# Patient Record
Sex: Female | Born: 1995 | Race: White | Hispanic: No | Marital: Single | Smoking: Never smoker
Health system: Southern US, Community
[De-identification: ages and names within clinical notes are randomized; demographics above are authoritative.]

## PROBLEM LIST (undated history)

## (undated) DIAGNOSIS — F32A Depression, unspecified: Secondary | ICD-10-CM

## (undated) DIAGNOSIS — Z87442 Personal history of urinary calculi: Secondary | ICD-10-CM

## (undated) DIAGNOSIS — D649 Anemia, unspecified: Secondary | ICD-10-CM

## (undated) DIAGNOSIS — N764 Abscess of vulva: Secondary | ICD-10-CM

## (undated) DIAGNOSIS — N200 Calculus of kidney: Secondary | ICD-10-CM

## (undated) DIAGNOSIS — Z5189 Encounter for other specified aftercare: Secondary | ICD-10-CM

## (undated) HISTORY — PX: WISDOM TOOTH EXTRACTION: SHX21

## (undated) HISTORY — DX: Depression, unspecified: F32.A

## (undated) HISTORY — DX: Calculus of kidney: N20.0

## (undated) HISTORY — PX: TUBAL LIGATION: SHX77

## (undated) HISTORY — DX: Encounter for other specified aftercare: Z51.89

## (undated) HISTORY — DX: Abscess of vulva: N76.4

---
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
## 2017-03-20 ENCOUNTER — Ambulatory Visit (INDEPENDENT_AMBULATORY_CARE_PROVIDER_SITE_OTHER): Payer: Self-pay | Admitting: Family Medicine

## 2017-03-20 ENCOUNTER — Encounter: Payer: Self-pay | Admitting: Family Medicine

## 2017-03-20 VITALS — BP 102/66 | HR 80 | Temp 98.6°F | Resp 18 | Wt 102.4 lb

## 2017-03-20 DIAGNOSIS — N926 Irregular menstruation, unspecified: Secondary | ICD-10-CM

## 2017-03-20 DIAGNOSIS — Z3009 Encounter for other general counseling and advice on contraception: Secondary | ICD-10-CM

## 2017-03-20 DIAGNOSIS — Z3201 Encounter for pregnancy test, result positive: Secondary | ICD-10-CM

## 2017-03-20 LAB — POCT URINE PREGNANCY: Preg Test, Ur: POSITIVE — AB

## 2017-03-20 MED ORDER — PRENATAL VITAMINS 28-0.8 MG PO TABS: 1.0000 | ORAL_TABLET | Freq: Every day | ORAL | 11 refills | Status: DC

## 2017-03-20 NOTE — Progress Notes (Signed)
BP 102/66   Pulse 80   Temp 98.6 F (37 C) (Oral)   Resp 18   Wt 102 lb 6.4 oz (46.4 kg)   LMP 03/16/2017   SpO2 98%   BMI 20.68 kg/m    Subjective:    Patient ID: Tammy Ward, female    DOB: July 18, 1996, 21 y.o.   MRN: [id number]  HPI: Tammy Ward is a 21 y.o. female  Chief Complaint  Patient presents with  . positive pregnancy test    at home   Patient is here to establish confirmation of pregnancy LMP 02/16/17; EDD 11/23/17 Few PNV, needs some Headaches No alcohol No smoking Tried nuvaring but it caused a ton of pain Extreme cramps Not regular with taking pills Really bad cramps in periods Had a bartholin cyst in the past  Depression screen [facility] 2/9 03/20/2017 12/19/2015  Decreased Interest 0 0  Down, Depressed, Hopeless 0 0  PHQ - 2 Score 0 0   Relevant past medical, surgical, family and social history reviewed Past Medical History:  Diagnosis Date  . Kidney stones   . Left genital labial abscess    Past Surgical History:  Procedure Laterality Date  . ABSCESS DRAINAGE  02-02-15   Labial   . KIDNEY STONE SURGERY  2014   Family History  Problem Relation Age of Onset  . Asthma Sister   . Cancer Neg Hx   . Diabetes Neg Hx   . Heart disease Neg Hx    Social History  Substance Use Topics  . Smoking status: Never Smoker  . Smokeless tobacco: Never Used  . Alcohol use No   Interim medical history since last visit reviewed. Allergies and medications reviewed  Review of Systems Per HPI unless specifically indicated above     Objective:    BP 102/66   Pulse 80   Temp 98.6 F (37 C) (Oral)   Resp 18   Wt 102 lb 6.4 oz (46.4 kg)   LMP 03/16/2017   SpO2 98%   BMI 20.68 kg/m   Wt Readings from Last 3 Encounters:  03/20/17 102 lb 6.4 oz (46.4 kg) (6 %, Z= -1.57)*  04/29/16 101 lb 1.6 oz (45.9 kg) (6 %, Z= -1.58)*  04/16/16 102 lb 2 oz (46.3 kg) (7 %, Z= -1.49)*   * Growth percentiles are based on CDC 2-20 Years data.      Physical Exam  Constitutional: She appears well-developed and well-nourished.  HENT:  Mouth/Throat: Mucous membranes are normal.  Eyes: EOM are normal. No scleral icterus.  Cardiovascular: Normal rate and regular rhythm.   Pulmonary/Chest: Effort normal and breath sounds normal.  Psychiatric: She has a normal mood and affect. Her behavior is normal.    Results for orders placed or performed in visit on 03/20/17  POCT urine pregnancy  Result Value Ref Range   Preg Test, Ur Positive (A) Negative      Assessment & Plan:   Problem List Items Addressed This Visit      Other   Positive pregnancy test - Primary    Refer to OB; discussed no alcohol safe during pregnancy; avoid medicine if possible, verify safety first with OB even for OTCs; prenatal vitamins prescribed      Relevant Orders   Ambulatory referral to Obstetrics / Gynecology   Encounter for contraceptive planning    Encouraged patient to talk with her OB to plan for contraception after current pregnancy       Other Visit  Diagnoses    Missed period       urine pregnancy checked here today; positive   Relevant Orders   POCT urine pregnancy (Completed)   Ambulatory referral to Obstetrics / Gynecology       Follow up plan: No Follow-up on file.  An after-visit summary was printed and given to the patient at check-out.  Please see the patient instructions which may contain other information and recommendations beyond what is mentioned above in the assessment and plan.  Meds ordered this encounter  Medications  . Multiple Vitamin (MULTI-VITAMINS PO)    Sig: Take by mouth.  . Prenatal Vit-Fe Fumarate-FA (PRENATAL VITAMINS) 28-0.8 MG TABS    Sig: Take 1 tablet by mouth daily.    Dispense:  30 tablet    Refill:  11    Orders Placed This Encounter  Procedures  . Ambulatory referral to Obstetrics / Gynecology  . POCT urine pregnancy

## 2017-03-20 NOTE — Patient Instructions (Addendum)
Try to drink 64 ounces of water a day Take a prenatal vitamin daily We'll refer you to Dr. Greggory Keen  Prenatal Care WHAT IS PRENATAL CARE? Prenatal care is the process of caring for a pregnant woman before she gives birth. Prenatal care makes sure that she and her baby remain as healthy as possible throughout pregnancy. Prenatal care may be provided by a midwife, family practice health care provider, or a childbirth and pregnancy specialist (obstetrician). Prenatal care may include physical examinations, testing, treatments, and education on nutrition, lifestyle, and social support services. WHY IS PRENATAL CARE SO IMPORTANT? Early and consistent prenatal care increases the chance that you and your baby will remain healthy throughout your pregnancy. This type of care also decreases a baby's risk of being born too early (prematurely), or being born smaller than expected (small for gestational age). Any underlying medical conditions you may have that could pose a risk during your pregnancy are discussed during prenatal care visits. You will also be monitored regularly for any new conditions that may arise during your pregnancy so they can be treated quickly and effectively. WHAT HAPPENS DURING PRENATAL CARE VISITS? Prenatal care visits may include the following: Discussion Tell your health care provider about any new signs or symptoms you have experienced since your last visit. These might include:  Nausea or vomiting.  Increased or decreased level of energy.  Difficulty sleeping.  Back or leg pain.  Weight changes.  Frequent urination.  Shortness of breath with physical activity.  Changes in your skin, such as the development of a rash or itchiness.  Vaginal discharge or bleeding.  Feelings of excitement or nervousness.  Changes in your baby's movements. You may want to write down any questions or topics you want to discuss with your health care provider and bring them with you to  your appointment. Examination During your first prenatal care visit, you will likely have a complete physical exam. Your health care provider will often examine your vagina, cervix, and the position of your uterus, as well as check your heart, lungs, and other body systems. As your pregnancy progresses, your health care provider will measure the size of your uterus and your baby's position inside your uterus. He or she may also examine you for early signs of labor. Your prenatal visits may also include checking your blood pressure and, after about 10-12 weeks of pregnancy, listening to your baby's heartbeat. Testing Regular testing often includes:  Urinalysis. This checks your urine for glucose, protein, or signs of infection.  Blood count. This checks the levels of white and red blood cells in your body.  Tests for sexually transmitted infections (STIs). Testing for STIs at the beginning of pregnancy is routinely done and is required in many states.  Antibody testing. You will be checked to see if you are immune to certain illnesses, such as rubella, that can affect a developing fetus.  Glucose screen. Around 24-28 weeks of pregnancy, your blood glucose level will be checked for signs of gestational diabetes. Follow-up tests may be recommended.  Group B strep. This is a bacteria that is commonly found inside a woman's vagina. This test will inform your health care provider if you need an antibiotic to reduce the amount of this bacteria in your body prior to labor and childbirth.  Ultrasound. Many pregnant women undergo an ultrasound screening around 18-20 weeks of pregnancy to evaluate the health of the fetus and check for any developmental abnormalities.  HIV (human immunodeficiency virus) testing. Early  in your pregnancy, you will be screened for HIV. If you are at high risk for HIV, this test may be repeated during your third trimester of pregnancy. You may be offered other testing based on  your age, personal or family medical history, or other factors. HOW OFTEN SHOULD I PLAN TO SEE MY HEALTH CARE PROVIDER FOR PRENATAL CARE? Your prenatal care check-up schedule depends on any medical conditions you have before, or develop during, your pregnancy. If you do not have any underlying medical conditions, you will likely be seen for checkups:  Monthly, during the first 6 months of pregnancy.  Twice a month during months 7 and 8 of pregnancy.  Weekly starting in the 9th month of pregnancy and until delivery. If you develop signs of early labor or other concerning signs or symptoms, you may need to see your health care provider more often. Ask your health care provider what prenatal care schedule is best for you. WHAT CAN I DO TO KEEP MYSELF AND MY BABY AS HEALTHY AS POSSIBLE DURING MY PREGNANCY?  Take a prenatal vitamin containing 400 micrograms (0.4 mg) of folic acid every day. Your health care provider may also ask you to take additional vitamins such as iodine, vitamin D, iron, copper, and zinc.  Take 1500-2000 mg of calcium daily starting at your 20th week of pregnancy until you deliver your baby.  Make sure you are up to date on your vaccinations. Unless directed otherwise by your health care provider:  You should receive a tetanus, diphtheria, and pertussis (Tdap) vaccination between the 27th and 36th week of your pregnancy, regardless of when your last Tdap immunization occurred. This helps protect your baby from whooping cough (pertussis) after he or she is born.  You should receive an annual inactivated influenza vaccine (IIV) to help protect you and your baby from influenza. This can be done at any point during your pregnancy.  Eat a well-rounded diet that includes:  Fresh fruits and vegetables.  Lean proteins.  Calcium-rich foods such as milk, yogurt, hard cheeses, and dark, leafy greens.  Whole grain breads.  Do noteat seafood high in mercury,  including:  Swordfish.  Tilefish.  Shark.  King mackerel.  More than 6 oz tuna per week.  Do not eat:  Raw or undercooked meats or eggs.  Unpasteurized foods, such as soft cheeses (brie, blue, or feta), juices, and milks.  Lunch meats.  Hot dogs that have not been heated until they are steaming.  Drink enough water to keep your urine clear or pale yellow. For many women, this may be 10 or more 8 oz glasses of water each day. Keeping yourself hydrated helps deliver nutrients to your baby and may prevent the start of pre-term uterine contractions.  Do not use any tobacco products including cigarettes, chewing tobacco, or electronic cigarettes. If you need help quitting, ask your health care provider.  Do not drink beverages containing alcohol. No safe level of alcohol consumption during pregnancy has been determined.  Do not use any illegal drugs. These can harm your developing baby or cause a miscarriage.  Ask your health care provider or pharmacist before taking any prescription or over-the-counter medicines, herbs, or supplements.  Limit your caffeine intake to no more than 200 mg per day.  Exercise. Unless told otherwise by your health care provider, try to get 30 minutes of moderate exercise most days of the week. Do not  do high-impact activities, contact sports, or activities with a high risk of falling,  such as horseback riding or downhill skiing.  Get plenty of rest.  Avoid anything that raises your body temperature, such as hot tubs and saunas.  If you own a cat, do not empty its litter box. Bacteria contained in cat feces can cause an infection called toxoplasmosis. This can result in serious harm to the fetus.  Stay away from chemicals such as insecticides, lead, mercury, and cleaning or paint products that contain solvents.  Do not have any X-rays taken unless medically necessary.  Take a childbirth and breastfeeding preparation class. Ask your health care  provider if you need a referral or recommendation. This information is not intended to replace advice given to you by your health care provider. Make sure you discuss any questions you have with your health care provider. Document Released: 12/11/2003 Document Revised: 05/12/2016 Document Reviewed: 02/22/2014 Elsevier Interactive Patient Education  2017 [employer].  Pregnancy, The Father's Role A father has an important role during his partner's pregnancy, labor, delivery, and after the birth of the baby. It is important to help and support your partner through this new period. There are many physical and emotional changes that happen. To be helpful and supportive during this time, you should know and understand what is happening to your partner during pregnancy, labor, delivery, and after the baby is born. What are the stages of pregnancy? Pregnancy usually lasts about 40 weeks. The pregnancy is divided into three trimesters. First Trimester  During the first 13 weeks, your partner may:  Feel tired.  Have painful breasts.  Feel nauseous or throw up.  Urinate more often.  Have mood changes. All of these changes are normal. If they are happening, try to be helpful, supportive, and understanding. This may include helping with household duties and activities and spending more time with each other. Second Trimester  During the next 14-28 weeks:  Your partner will likely feel better and more energetic.  This is the best time of the pregnancy to be more active together.  You will be able to see her belly showing the pregnancy.  You may be able to feel the baby kick.  Your partner may have soreness or aching in her back as she gains weight. You can help her by carrying heavy things and by rubbing her back when she is feeling sore. Third Trimester  During the final 12 weeks, your partner may:  Become more uncomfortable as the baby grows.  Have a hard time doing everyday activities,  and her balance may be off.  Have a hard time bending over.  Tire easily.  Have difficulty sleeping. At this time, the birth of your baby is close. You and your partner may have concerns or questions. This is normal. Talk with each other and with your health care provider. Continue to help your partner with housework, encourage her to rest, and rub her sore back and legs, if this helps her. What can I expect or do during the pregnancy? You can expect to experience some changes. There are also many things you can do to help prepare you and your partner for your baby. Emotional Changes  During your partner's pregnancy, emotional changes for you may include:  Having feelings of happiness, excitement, and pride.  Being concerned about having new responsibilities, such as financial or educational responsibilities.  Feeling overwhelmed or scared.  Being worried that a baby will change your relationship with your partner. These feelings are normal. Talk about them openly with your partner and your  health care provider. Prenatal Care  Attend prenatal care visits with your partner. This is a good time for you to get to know your health care provider, follow the pregnancy, and ask questions.  Prenatal visits usually occur one time each month for six months, then every two weeks for two months, and then one time each week during the last month. You may have more prenatal visits if your health care provider believes this is needed.  Your health care provider usually does an ultrasound of the baby at one of the prenatal visits. This may happen more often if your health care provider thinks it is needed. Sexual Activity  Sexual intercourse is safe unless there is a problem with the pregnancy and your health care provider advises you to not have sexual intercourse. Because physical and emotional changes happen in pregnancy, your partner may not want to have sex during certain times. Trying different  positions may make sexual intercourse more comfortable. However, always respect your partner's decision if she does not want to have sex. It is important for both of you to discuss your feelings and desires. Talk with your health care provider about any questions that you may have about sexual intercourse during pregnancy. Childbirth Classes  Attend childbirth classes with your partner if you are able. Classes prepare you and help you to understand what happens during labor and delivery, and they help you and your partner to bond. There are even some classes that are only for new fathers. Classes also teach you and your partner:  Various relaxation techniques.  How to work with her labor pains.  How to focus during labor and delivery. What should I know about labor and delivery? Many fathers want to be present while their partner is going through labor and delivery. You may:  Be asked to time the contractions, massage your partner's back, and breathe with her during the contractions.  Get to see and enjoy the excitement of your baby being born, and you may even be able to cut your baby's umbilical cord. If you feel like you might faint or you are uncomfortable, ask someone to help you.  Need to leave the room if a problem develops during labor or delivery. A cesarean delivery, or C-section, is a procedure that may be used to deliver the baby. It is done through an incision in the abdomen and the uterus. A cesarean delivery may be scheduled or it may be an emergency procedure during labor and delivery. Most hospitals allow the father to be in the room for a cesarean delivery unless it is an emergency. Recovery from a cesarean delivery usually requires more help from the father. What happens after delivery? After your baby is born, your partner will go through many changes again. These changes could last a few months or longer. Postpartum Depression  Your partner may take awhile to regain her  strength. She may also have feelings of sadness (postpartum blues or postpartum depression). If your partner is acting unusually sad or depressed, talk with your health care provider right away. This can be a serious medical condition that requires treatment. Breastfeeding  Your partner may decide to breastfeed the baby. This helps with bonding between the mother and the baby, and breast milk is the best nutrition for your baby. You can feel included by burping the baby and bottle-feeding the baby with breast milk that was collected from the mother. This allows your partner to rest and helps you to bond with  your baby. Sexual Activity  It may take a few months for your partner's body to heal and be ready for sexual intercourse again. This may take longer after a cesarean delivery. If you have any questions about having sexual intercourse or if it is painful for your partner, talk with your health care provider. It is possible for breastfeeding mothers to become pregnant even if they are not having menstrual periods. Use birth control (contraception) unless you and your partner would like to become pregnant again. What should I remember? Fatherhood and having a baby is an ongoing learning experience. It is common to be anxious, concerned, or afraid that you may not be taking care of your newborn baby properly. It is important to talk with your partner and your health care provider if you are worried or have any questions. This information is not intended to replace advice given to you by your health care provider. Make sure you discuss any questions you have with your health care provider. Document Released: 05/26/2008 Document Revised: 05/12/2016 Document Reviewed: 08/25/2014 Elsevier Interactive Patient Education  2017 [employer].

## 2017-04-05 NOTE — Assessment & Plan Note (Signed)
Encouraged patient to talk with her OB to plan for contraception after current pregnancy

## 2017-04-05 NOTE — Assessment & Plan Note (Signed)
Refer to OB; discussed no alcohol safe during pregnancy; avoid medicine if possible, verify safety first with OB even for OTCs; prenatal vitamins prescribed

## 2017-04-13 ENCOUNTER — Ambulatory Visit (INDEPENDENT_AMBULATORY_CARE_PROVIDER_SITE_OTHER): Payer: Self-pay | Admitting: Certified Nurse Midwife

## 2017-04-13 VITALS — BP 95/61 | HR 74 | Ht 59.0 in | Wt 101.4 lb

## 2017-04-13 DIAGNOSIS — Z3401 Encounter for supervision of normal first pregnancy, first trimester: Secondary | ICD-10-CM

## 2017-04-13 DIAGNOSIS — Z113 Encounter for screening for infections with a predominantly sexual mode of transmission: Secondary | ICD-10-CM

## 2017-04-13 DIAGNOSIS — Z1389 Encounter for screening for other disorder: Secondary | ICD-10-CM

## 2017-04-13 NOTE — Progress Notes (Signed)
Ashby Dawes Chiappetta presents for NOB nurse interview visit. Pregnancy confirmation done on 03/20/17 by Dr. [name]. Sherie Don. UPT-positive. LMP: 02/16/2017 (exact).  G-1.  P-0000.  Pregnancy education material explained and given. No cats in the home. NOB labs ordered.  HIV labs and Drug screen were explained optional and she did not decline. Drug screen ordered. PNV encouraged. Genetic screening options discussed. Genetic testing: Unsure. May do the [facility].  Pt may discuss with provider. Pt. To follow up with provider in 4 weeks for NOB physical.  Pt having nausea. May try Vitamin B6 , 3xd and Unisom at night for sleep if needed. Sample of Bonjesta given but not coming across to order yet. FOB states there is a genetic trait on his side of family: Two different color eyes and difficulty hearing. All questions answered.

## 2017-04-13 NOTE — Patient Instructions (Signed)
Pregnancy and Zika Virus Disease Zika virus disease, or Zika, is an illness that can spread to people from mosquitoes that carry the virus. It may also spread from person to person through infected body fluids. Zika first occurred in [place], but recently it has spread to new areas. The virus occurs in tropical climates. The location of Zika continues to change. Most people who become infected with Zika virus do not develop serious illness. However, Zika may cause birth defects in an unborn baby whose mother is infected with the virus. It may also increase the risk of miscarriage. What are the symptoms of Zika virus disease? In many cases, people who have been infected with Zika virus do not develop any symptoms. If symptoms appear, they usually start about a week after the person is infected. Symptoms are usually mild. They may include:  Fever.  Rash.  Red eyes.  Joint pain. How does Zika virus disease spread? The main way that Zika virus spreads is through the bite of a certain type of mosquito. Unlike most types of mosquitos, which bite only at night, the type of mosquito that carries Zika virus bites both at night and during the day. Zika virus can also spread through sexual contact, through a blood transfusion, and from a mother to her baby before or during birth. Once you have had Zika virus disease, it is unlikely that you will get it again. Can I pass Zika to my baby during pregnancy? Yes, Zika can pass from a mother to her baby before or during birth. What problems can Zika cause for my baby? A woman who is infected with Zika virus while pregnant is at risk of having her baby born with a condition in which the brain or head is smaller than expected (microcephaly). Babies who have microcephaly can have developmental delays, seizures, hearing problems, and vision problems. Having Zika virus disease during pregnancy can also increase the risk of miscarriage. How can Zika virus disease be  prevented? There is no vaccine to prevent Zika. The best way to prevent the disease is to avoid infected mosquitoes and avoid exposure to body fluids that can spread the virus. Avoid any possible exposure to Zika by taking the following precautions. For women and their sex partners:  Avoid traveling to high-risk areas. The locations where Zika is being reported change often. To identify high-risk areas, check the CDC travel website: www.cdc.gov/zika/geo/index.html  If you or your sex partner must travel to a high-risk area, talk with a health care provider before and after traveling.  Take all precautions to avoid mosquito bites if you live in, or travel to, any of the high-risk areas. Insect repellents are safe to use during pregnancy.  Ask your health care provider when it is safe to have sexual contact. For women:  If you are pregnant or trying to become pregnant, avoid sexual contact with persons who may have been exposed to Zika virus, persons who have possible symptoms of Zika, or persons whose history you are unsure about. If you choose to have sexual contact with someone who may have been exposed to Zika virus, use condoms correctly during the entire duration of sexual activity, every time. Do not share sexual devices, as you may be exposed to body fluids.  Ask your health care provider about when it is safe to attempt pregnancy after a possible exposure to Zika virus. What steps should I take to avoid mosquito bites? Take these steps to avoid mosquito bites when you are   in a high-risk area:  Wear loose clothing that covers your arms and legs.  Limit your outdoor activities.  Do not open windows unless they have window screens.  Sleep under mosquito nets.  Use insect repellent. The best insect repellents have:  DEET, picaridin, oil of lemon eucalyptus (OLE), or IR3535 in them.  Higher amounts of an active ingredient in them.  Remember that insect repellents are safe to use  during pregnancy.  Do not use OLE on children who are younger than 3 years of age. Do not use insect repellent on babies who are younger than 2 months of age.  Cover your child's stroller with mosquito netting. Make sure the netting fits snugly and that any loose netting does not cover your child's mouth or nose. Do not use a blanket as a mosquito-protection cover.  Do not apply insect repellent underneath clothing.  If you are using sunscreen, apply the sunscreen before applying the insect repellent.  Treat clothing with permethrin. Do not apply permethrin directly to your skin. Follow label directions for safe use.  Get rid of standing water, where mosquitoes may reproduce. Standing water is often found in items such as buckets, bowls, animal food dishes, and flowerpots. When you return from traveling to any high-risk area, continue taking actions to protect yourself against mosquito bites for 3 weeks, even if you show no signs of illness. This will prevent spreading Zika virus to uninfected mosquitoes. What should I know about the sexual transmission of Zika? People can spread Zika to their sexual partners during vaginal, anal, or oral sex, or by sharing sexual devices. Many people with Zika do not develop symptoms, so a person could spread the disease without knowing that they are infected. The greatest risk is to women who are pregnant or who may become pregnant. Zika virus can live longer in semen than it can live in blood. Couples can prevent sexual transmission of the virus by:  Using condoms correctly during the entire duration of sexual activity, every time. This includes vaginal, anal, and oral sex.  Not sharing sexual devices. Sharing increases your risk of being exposed to body fluid from another person.  Avoiding all sexual activity until your health care provider says it is safe. Should I be tested for Zika virus? A sample of your blood can be tested for Zika virus. A pregnant  woman should be tested if she may have been exposed to the virus or if she has symptoms of Zika. She may also have additional tests done during her pregnancy, such ultrasound testing. Talk with your health care provider about which tests are recommended. This information is not intended to replace advice given to you by your health care provider. Make sure you discuss any questions you have with your health care provider. Document Released: 08/29/2015 Document Revised: 05/15/2016 Document Reviewed: 08/22/2015 Elsevier Interactive Patient Education  2017 [employer] Hyperemesis Gravidarum Hyperemesis gravidarum is a severe form of nausea and vomiting that happens during pregnancy. Hyperemesis is worse than morning sickness. It may cause you to have nausea or vomiting all day for many days. It may keep you from eating and drinking enough food and liquids. Hyperemesis usually occurs during the first half (the first 20 weeks) of pregnancy. It often goes away once a woman is in her second half of pregnancy. However, sometimes hyperemesis continues through an entire pregnancy. What are the causes? The cause of this condition is not known. It may be related to changes in chemicals (hormones)   in the body during pregnancy, such as the high level of pregnancy hormone (human chorionic gonadotropin) or the increase in the female sex hormone (estrogen). What are the signs or symptoms? Symptoms of this condition include:  Severe nausea and vomiting.  Nausea that does not go away.  Vomiting that does not allow you to keep any food down.  Weight loss.  Body fluid loss (dehydration).  Having no desire to eat, or not liking food that you have previously enjoyed. How is this diagnosed? This condition may be diagnosed based on:  A physical exam.  Your medical history.  Your symptoms.  Blood tests.  Urine tests. How is this treated? This condition may be managed with medicine. If medicines to do not  help relieve nausea and vomiting, you may need to receive fluids through an IV tube at the hospital. Follow these instructions at home:  Take over-the-counter and prescription medicines only as told by your health care provider.  Avoid iron pills and multivitamins that contain iron for the first 3-4 months of pregnancy. If you take prescription iron pills, do not stop taking them unless your health care provider approves.  Take the following actions to help prevent nausea and vomiting:  In the morning, before getting out of bed, try eating a couple of dry crackers or a piece of toast.  Avoid foods and smells that upset your stomach. Fatty and spicy foods may make nausea worse.  Eat 5-6 small meals a day.  Do not drink fluids while eating meals. Drink between meals.  Eat or suck on things that have ginger in them. Ginger can help relieve nausea.  Avoid food preparation. The smell of food can spoil your appetite or trigger nausea.  Follow instructions from your health care provider about eating or drinking restrictions.  For snacks, eat high-protein foods, such as cheese.  Keep all follow-up and pre-birth (prenatal) visits as told by your health care provider. This is important. Contact a health care provider if:  You have pain in your abdomen.  You have a severe headache.  You have vision problems.  You are losing weight. Get help right away if:  You cannot drink fluids without vomiting.  You vomit blood.  You have constant nausea and vomiting.  You are very weak.  You are very thirsty.  You feel dizzy.  You faint.  You have a fever or other symptoms that last for more than 2-3 days.  You have a fever and your symptoms suddenly get worse. Summary  Hyperemesis gravidarum is a severe form of nausea and vomiting that happens during pregnancy.  Making some changes to your eating habits may help relieve nausea and vomiting.  This condition may be managed with  medicine.  If medicines to do not help relieve nausea and vomiting, you may need to receive fluids through an IV tube at the hospital. This information is not intended to replace advice given to you by your health care provider. Make sure you discuss any questions you have with your health care provider. Document Released: 12/08/2005 Document Revised: 08/06/2016 Document Reviewed: 08/06/2016 Elsevier Interactive Patient Education  2017 [employer] First Trimester of Pregnancy The first trimester of pregnancy is from week 1 until the end of week 13 (months 1 through 3). During this time, your baby will begin to develop inside you. At 6-8 weeks, the eyes and face are formed, and the heartbeat can be seen on ultrasound. At the end of 12 weeks, all the baby's   organs are formed. Prenatal care is all the medical care you receive before the birth of your baby. Make sure you get good prenatal care and follow all of your doctor's instructions. Follow these instructions at home: Medicines   Take over-the-counter and prescription medicines only as told by your doctor. Some medicines are safe and some medicines are not safe during pregnancy.  Take a prenatal vitamin that contains at least 600 micrograms (mcg) of folic acid.  If you have trouble pooping (constipation), take medicine that will make your stool soft (stool softener) if your doctor approves. Eating and drinking   Eat regular, healthy meals.  Your doctor will tell you the amount of weight gain that is right for you.  Avoid raw meat and uncooked cheese.  If you feel sick to your stomach (nauseous) or throw up (vomit):  Eat 4 or 5 small meals a day instead of 3 large meals.  Try eating a few soda crackers.  Drink liquids between meals instead of during meals.  To prevent constipation:  Eat foods that are high in fiber, like fresh fruits and vegetables, whole grains, and beans.  Drink enough fluids to keep your pee (urine) clear or  pale yellow. Activity   Exercise only as told by your doctor. Stop exercising if you have cramps or pain in your lower belly (abdomen) or low back.  Do not exercise if it is too hot, too humid, or if you are in a place of great height (high altitude).  Try to avoid standing for long periods of time. Move your legs often if you must stand in one place for a long time.  Avoid heavy lifting.  Wear low-heeled shoes. Sit and stand up straight.  You can have sex unless your doctor tells you not to. Relieving pain and discomfort   Wear a good support bra if your breasts are sore.  Take warm water baths (sitz baths) to soothe pain or discomfort caused by hemorrhoids. Use hemorrhoid cream if your doctor says it is okay.  Rest with your legs raised if you have leg cramps or low back pain.  If you have puffy, bulging veins (varicose veins) in your legs:  Wear support hose or compression stockings as told by your doctor.  Raise (elevate) your feet for 15 minutes, 3-4 times a day.  Limit salt in your food. Prenatal care   Schedule your prenatal visits by the twelfth week of pregnancy.  Write down your questions. Take them to your prenatal visits.  Keep all your prenatal visits as told by your doctor. This is important. Safety   Wear your seat belt at all times when driving.  Make a list of emergency phone numbers. The list should include numbers for family, friends, the hospital, and police and fire departments. General instructions   Ask your doctor for a referral to a local prenatal class. Begin classes no later than at the start of month 6 of your pregnancy.  Ask for help if you need counseling or if you need help with nutrition. Your doctor can give you advice or tell you where to go for help.  Do not use hot tubs, steam rooms, or saunas.  Do not douche or use tampons or scented sanitary pads.  Do not cross your legs for long periods of time.  Avoid all herbs and alcohol.  Avoid drugs that are not approved by your doctor.  Do not use any tobacco products, including cigarettes, chewing tobacco, and electronic cigarettes.   If you need help quitting, ask your doctor. You may get counseling or other support to help you quit.  Avoid cat litter boxes and soil used by cats. These carry germs that can cause birth defects in the baby and can cause a loss of your baby (miscarriage) or stillbirth.  Visit your dentist. At home, brush your teeth with a soft toothbrush. Be gentle when you floss. Contact a doctor if:  You are dizzy.  You have mild cramps or pressure in your lower belly.  You have a nagging pain in your belly area.  You continue to feel sick to your stomach, you throw up, or you have watery poop (diarrhea).  You have a bad smelling fluid coming from your vagina.  You have pain when you pee (urinate).  You have increased puffiness (swelling) in your face, hands, legs, or ankles. Get help right away if:  You have a fever.  You are leaking fluid from your vagina.  You have spotting or bleeding from your vagina.  You have very bad belly cramping or pain.  You gain or lose weight rapidly.  You throw up blood. It may look like coffee grounds.  You are around people who have German measles, fifth disease, or chickenpox.  You have a very bad headache.  You have shortness of breath.  You have any kind of trauma, such as from a fall or a car accident. Summary  The first trimester of pregnancy is from week 1 until the end of week 13 (months 1 through 3).  To take care of yourself and your unborn baby, you will need to eat healthy meals, take medicines only if your doctor tells you to do so, and do activities that are safe for you and your baby.  Keep all follow-up visits as told by your doctor. This is important as your doctor will have to ensure that your baby is healthy and growing well. This information is not intended to replace advice given  to you by your health care provider. Make sure you discuss any questions you have with your health care provider. Document Released: 05/26/2008 Document Revised: 12/16/2016 Document Reviewed: 12/16/2016 Elsevier Interactive Patient Education  2017 [employer] Commonly Asked Questions During Pregnancy  Cats: A parasite can be excreted in cat feces.  To avoid exposure you need to have another person empty the little box.  If you must empty the litter box you will need to wear gloves.  Wash your hands after handling your cat.  This parasite can also be found in raw or undercooked meat so this should also be avoided.  Colds, Sore Throats, Flu: Please check your medication sheet to see what you can take for symptoms.  If your symptoms are unrelieved by these medications please call the office.  Dental Work: Most any dental work your dentist recommends is permitted.  X-rays should only be taken during the first trimester if absolutely necessary.  Your abdomen should be shielded with a lead apron during all x-rays.  Please notify your provider prior to receiving any x-rays.  Novocaine is fine; gas is not recommended.  If your dentist requires a note from us prior to dental work please call the office and we will provide one for you.  Exercise: Exercise is an important part of staying healthy during your pregnancy.  You may continue most exercises you were accustomed to prior to pregnancy.  Later in your pregnancy you will most likely notice you have difficulty with activities   requiring balance like riding a bicycle.  It is important that you listen to your body and avoid activities that put you at a higher risk of falling.  Adequate rest and staying well hydrated are a must!  If you have questions about the safety of specific activities ask your provider.    Exposure to Children with illness: Try to avoid obvious exposure; report any symptoms to us when noted,  If you have chicken pos, red measles or mumps,  you should be immune to these diseases.   Please do not take any vaccines while pregnant unless you have checked with your OB provider.  Fetal Movement: After 28 weeks we recommend you do "kick counts" twice daily.  Lie or sit down in a calm quiet environment and count your baby movements "kicks".  You should feel your baby at least 10 times per hour.  If you have not felt 10 kicks within the first hour get up, walk around and have something sweet to eat or drink then repeat for an additional hour.  If count remains less than 10 per hour notify your provider.  Fumigating: Follow your pest control agent's advice as to how long to stay out of your home.  Ventilate the area well before re-entering.  Hemorrhoids:   Most over-the-counter preparations can be used during pregnancy.  Check your medication to see what is safe to use.  It is important to use a stool softener or fiber in your diet and to drink lots of liquids.  If hemorrhoids seem to be getting worse please call the office.   Hot Tubs:  Hot tubs Jacuzzis and saunas are not recommended while pregnant.  These increase your internal body temperature and should be avoided.  Intercourse:  Sexual intercourse is safe during pregnancy as long as you are comfortable, unless otherwise advised by your provider.  Spotting may occur after intercourse; report any bright red bleeding that is heavier than spotting.  Labor:  If you know that you are in labor, please go to the hospital.  If you are unsure, please call the office and let us help you decide what to do.  Lifting, straining, etc:  If your job requires heavy lifting or straining please check with your provider for any limitations.  Generally, you should not lift items heavier than that you can lift simply with your hands and arms (no back muscles)  Painting:  Paint fumes do not harm your pregnancy, but may make you ill and should be avoided if possible.  Latex or water based paints have less odor  than oils.  Use adequate ventilation while painting.  Permanents & Hair Color:  Chemicals in hair dyes are not recommended as they cause increase hair dryness which can increase hair loss during pregnancy.  " Highlighting" and permanents are allowed.  Dye may be absorbed differently and permanents may not hold as well during pregnancy.  Sunbathing:  Use a sunscreen, as skin burns easily during pregnancy.  Drink plenty of fluids; avoid over heating.  Tanning Beds:  Because their possible side effects are still unknown, tanning beds are not recommended.  Ultrasound Scans:  Routine ultrasounds are performed at approximately 20 weeks.  You will be able to see your baby's general anatomy an if you would like to know the gender this can usually be determined as well.  If it is questionable when you conceived you may also receive an ultrasound early in your pregnancy for dating purposes.  Otherwise ultrasound exams   are not routinely performed unless there is a medical necessity.  Although you can request a scan we ask that you pay for it when conducted because insurance does not cover " patient request" scans.  Work: If your pregnancy proceeds without complications you may work until your due date, unless your physician or employer advises otherwise.  Round Ligament Pain/Pelvic Discomfort:  Sharp, shooting pains not associated with bleeding are fairly common, usually occurring in the second trimester of pregnancy.  They tend to be worse when standing up or when you remain standing for long periods of time.  These are the result of pressure of certain pelvic ligaments called "round ligaments".  Rest, Tylenol and heat seem to be the most effective relief.  As the womb and fetus grow, they rise out of the pelvis and the discomfort improves.  Please notify the office if your pain seems different than that described.  It may represent a more serious condition.   

## 2017-04-14 LAB — URINALYSIS, ROUTINE W REFLEX MICROSCOPIC
Bilirubin, UA: NEGATIVE
Glucose, UA: NEGATIVE
KETONES UA: NEGATIVE
Leukocytes, UA: NEGATIVE
NITRITE UA: NEGATIVE
Protein, UA: NEGATIVE
RBC UA: NEGATIVE
Specific Gravity, UA: 1.023 (ref 1.005–1.030)
UUROB: 1 mg/dL (ref 0.2–1.0)
pH, UA: 7.5 (ref 5.0–7.5)

## 2017-04-14 LAB — MONITOR DRUG PROFILE 14(MW)
AMPHETAMINE SCREEN URINE: NEGATIVE ng/mL
BARBITURATE SCREEN URINE: NEGATIVE ng/mL
BENZODIAZEPINE SCREEN, URINE: NEGATIVE ng/mL
BUPRENORPHINE, URINE: NEGATIVE ng/mL
CANNABINOIDS UR QL SCN: NEGATIVE ng/mL
Cocaine (Metab) Scrn, Ur: NEGATIVE ng/mL
Creatinine(Crt), U: 125.1 mg/dL (ref 20.0–300.0)
FENTANYL, URINE: NEGATIVE pg/mL
Meperidine Screen, Urine: NEGATIVE ng/mL
Methadone Screen, Urine: NEGATIVE ng/mL
OPIATE SCREEN URINE: NEGATIVE ng/mL
OXYCODONE+OXYMORPHONE UR QL SCN: NEGATIVE ng/mL
PH UR, DRUG SCRN: 7.2 (ref 4.5–8.9)
PHENCYCLIDINE QUANTITATIVE URINE: NEGATIVE ng/mL
PROPOXYPHENE SCREEN URINE: NEGATIVE ng/mL
SPECIFIC GRAVITY: 1.024
TRAMADOL SCREEN, URINE: NEGATIVE ng/mL

## 2017-04-14 LAB — CBC WITH DIFFERENTIAL/PLATELET
BASOS: 0 %
Basophils Absolute: 0 10*3/uL (ref 0.0–0.2)
EOS (ABSOLUTE): 0.2 10*3/uL (ref 0.0–0.4)
Eos: 2 %
Hematocrit: 35.9 % (ref 34.0–46.6)
Hemoglobin: 12 g/dL (ref 11.1–15.9)
IMMATURE GRANS (ABS): 0 10*3/uL (ref 0.0–0.1)
IMMATURE GRANULOCYTES: 0 %
LYMPHS: 21 %
Lymphocytes Absolute: 1.7 10*3/uL (ref 0.7–3.1)
MCH: 30.5 pg (ref 26.6–33.0)
MCHC: 33.4 g/dL (ref 31.5–35.7)
MCV: 91 fL (ref 79–97)
Monocytes Absolute: 0.4 10*3/uL (ref 0.1–0.9)
Monocytes: 5 %
NEUTROS PCT: 72 %
Neutrophils Absolute: 5.9 10*3/uL (ref 1.4–7.0)
PLATELETS: 245 10*3/uL (ref 150–379)
RBC: 3.93 x10E6/uL (ref 3.77–5.28)
RDW: 12.6 % (ref 12.3–15.4)
WBC: 8.2 10*3/uL (ref 3.4–10.8)

## 2017-04-14 LAB — VARICELLA ZOSTER ANTIBODY, IGG: VARICELLA: 577 {index} (ref >165)

## 2017-04-14 LAB — ABO

## 2017-04-14 LAB — HIV ANTIBODY (ROUTINE TESTING W REFLEX): HIV Screen 4th Generation wRfx: NONREACTIVE

## 2017-04-14 LAB — RH TYPE: Rh Factor: POSITIVE

## 2017-04-14 LAB — HEPATITIS B SURFACE ANTIGEN: HEP B S AG: NEGATIVE

## 2017-04-14 LAB — NICOTINE SCREEN, URINE: Cotinine Ql Scrn, Ur: NEGATIVE ng/mL

## 2017-04-14 LAB — ANTIBODY SCREEN: ANTIBODY SCREEN: NEGATIVE

## 2017-04-14 LAB — RPR: RPR Ser Ql: NONREACTIVE

## 2017-04-14 LAB — RUBELLA SCREEN: RUBELLA: 4.01 {index} (ref >0.99)

## 2017-04-15 ENCOUNTER — Other Ambulatory Visit: Payer: Self-pay | Admitting: Certified Nurse Midwife

## 2017-04-15 LAB — GC/CHLAMYDIA PROBE AMP
Chlamydia trachomatis, NAA: POSITIVE — AB
Neisseria gonorrhoeae by PCR: NEGATIVE

## 2017-04-15 LAB — URINE CULTURE, OB REFLEX

## 2017-04-15 LAB — CULTURE, OB URINE

## 2017-04-15 MED ORDER — AZITHROMYCIN 500 MG PO TABS: 1000.0000 mg | ORAL_TABLET | Freq: Every day | ORAL | 0 refills | Status: DC

## 2017-04-15 NOTE — Progress Notes (Signed)
NOB tests positive for Chlamydia. Tammy Ward called and notified. Answered all of her questions.  Doreene Burke, CNM

## 2017-05-08 ENCOUNTER — Ambulatory Visit (INDEPENDENT_AMBULATORY_CARE_PROVIDER_SITE_OTHER): Payer: Medicaid Other | Admitting: Certified Nurse Midwife

## 2017-05-08 VITALS — BP 94/58 | HR 88 | Wt 97.1 lb

## 2017-05-08 DIAGNOSIS — O09291 Supervision of pregnancy with other poor reproductive or obstetric history, first trimester: Secondary | ICD-10-CM

## 2017-05-08 DIAGNOSIS — Z3401 Encounter for supervision of normal first pregnancy, first trimester: Secondary | ICD-10-CM

## 2017-05-08 DIAGNOSIS — O09891 Supervision of other high risk pregnancies, first trimester: Secondary | ICD-10-CM

## 2017-05-08 LAB — POCT URINALYSIS DIPSTICK
Bilirubin, UA: NEGATIVE
Blood, UA: NEGATIVE
GLUCOSE UA: NEGATIVE
Ketones, UA: NEGATIVE
NITRITE UA: NEGATIVE
Protein, UA: NEGATIVE
Spec Grav, UA: <=1.005 — AB (ref 1.010–1.025)
UROBILINOGEN UA: 0.2 U/dL
pH, UA: 8.5 — AB (ref 5.0–8.0)

## 2017-05-08 NOTE — Progress Notes (Signed)
NEW OB HISTORY AND PHYSICAL  SUBJECTIVE:       Tammy Ward is a 21 y.o. G27P0000 female, Patient's last menstrual period was 02/16/2017 (exact date)., Estimated Date of Delivery: 11/23/17, [redacted]w[redacted]d, presents today for establishment of Prenatal Care. She has no unusual complaints    Gynecologic History Patient's last menstrual period was 02/16/2017 (exact date). Normal Contraception: none Last Pap: N/A   Obstetric History OB History  Gravida Para Term Preterm AB Living  1 0 0 0 0 0  SAB TAB Ectopic Multiple Live Births  0 0 0 0      # Outcome Date GA Lbr Len/2nd Weight Sex Delivery Anes PTL Lv  1 Current             Obstetric Comments  Menstrual age: 58    Age 1st Pregnancy:NA    Past Medical History:  Diagnosis Date  . Kidney stones   . Left genital labial abscess     Past Surgical History:  Procedure Laterality Date  . ABSCESS DRAINAGE  02-02-15   Labial   . KIDNEY STONE SURGERY  2014    Current Outpatient Prescriptions on File Prior to Visit  Medication Sig Dispense Refill  . azithromycin (ZITHROMAX) 500 MG tablet Take 2 tablets (1,000 mg total) by mouth daily. 2 tablet 0  . Prenatal Vit-Fe Fumarate-FA (PRENATAL VITAMINS) 28-0.8 MG TABS Take 1 tablet by mouth daily. 30 tablet 11   No current facility-administered medications on file prior to visit.     No Known Allergies  Social History   Social History  . Marital status: Single    Spouse name: N/A  . Number of children: N/A  . Years of education: N/A   Occupational History  . Not on file.   Social History Main Topics  . Smoking status: Never Smoker  . Smokeless tobacco: Never Used  . Alcohol use No  . Drug use: No  . Sexual activity: Yes    Birth control/ protection: None   Other Topics Concern  . Not on file   Social History Narrative  . No narrative on file    Family History  Problem Relation Age of Onset  . Asthma Sister   . Cancer Neg Hx   . Diabetes Neg Hx   . Heart disease  Neg Hx     The following portions of the patient's history were reviewed and updated as appropriate: allergies, current medications, past OB history, past medical history, past surgical history, past family history, past social history, and problem list.   OBJECTIVE: Initial Physical Exam (New OB)  GENERAL APPEARANCE: alert, well appearing, in no apparent distress, oriented to person, place and time HEAD: normocephalic, atraumatic MOUTH: mucous membranes moist, pharynx normal without lesions THYROID: no thyromegaly or masses present BREASTS: no masses noted on left, no significant tenderness, no palpable axillary nodes, no skin changes. Right breast positive for 1-2 cm firm mass at 2 o'clock. It is 2 cm from nipple. It is round and movable.  LUNGS: clear to auscultation, no wheezes, rales or rhonchi, symmetric air entry HEART: regular rate and rhythm, no murmurs ABDOMEN: soft, nontender, nondistended, no abnormal masses, no epigastric pain and FHT present EXTREMITIES: no redness or tenderness in the calves or thighs SKIN: normal coloration and turgor, no rashes LYMPH NODES: no adenopathy palpable NEUROLOGIC: alert, oriented, normal speech, no focal findings or movement disorder noted  PELVIC EXAM EXTERNAL GENITALIA: normal appearing vulva with no masses, tenderness or lesions VAGINA: no abnormal discharge  or lesions and abnormal odor CERVIX: no lesions or cervical motion tenderness and discharge white UTERUS: gravid ADNEXA: no masses palpable and nontender OB EXAM PELVIMETRY: appears adequate RECTUM: exam not indicated  ASSESSMENT: Normal pregnancy Right Breast Mass  PLAN: New OB counseling: The patient has been given an overview regarding routine prenatal care. Recommendations regarding diet, weight gain,medications, and exercise in pregnancy were given. Prenatal testing and ultrasound use in pregnancy were reviewed. Benefits of Breast Feeding were discussed. The patient is  encouraged to consider nursing her baby post partum. Nuswab today for TOC for previous chlamydia and mal odor. Will check right breast at next visit for changes.   Doreene BurkeAnnie Bernal Luhman, CNM

## 2017-05-08 NOTE — Patient Instructions (Signed)

## 2017-05-14 LAB — NUSWAB VAGINITIS PLUS (VG+)
Atopobium vaginae: HIGH Score — AB
BVAB 2: HIGH {score} — AB
Candida albicans, NAA: NEGATIVE
Candida glabrata, NAA: NEGATIVE
Chlamydia trachomatis, NAA: NEGATIVE
MEGASPHAERA 1: HIGH {score} — AB
Neisseria gonorrhoeae, NAA: NEGATIVE
Trich vag by NAA: NEGATIVE

## 2017-05-19 ENCOUNTER — Other Ambulatory Visit: Payer: Self-pay | Admitting: Certified Nurse Midwife

## 2017-05-19 ENCOUNTER — Encounter: Payer: Self-pay | Admitting: Certified Nurse Midwife

## 2017-05-19 MED ORDER — METRONIDAZOLE 500 MG PO TABS: 500.0000 mg | ORAL_TABLET | Freq: Two times a day (BID) | ORAL | 0 refills | Status: AC

## 2017-05-19 NOTE — Progress Notes (Signed)
NuSwab shows positive for BV, prescription sent to pharmacy.   Doreene BurkeAnnie Hayle Parisi, CNM

## 2017-06-05 ENCOUNTER — Encounter: Payer: Self-pay | Admitting: Certified Nurse Midwife

## 2017-06-05 ENCOUNTER — Ambulatory Visit (INDEPENDENT_AMBULATORY_CARE_PROVIDER_SITE_OTHER): Payer: Medicaid Other | Admitting: Certified Nurse Midwife

## 2017-06-05 VITALS — BP 93/57 | HR 76 | Wt 102.5 lb

## 2017-06-05 DIAGNOSIS — O09299 Supervision of pregnancy with other poor reproductive or obstetric history, unspecified trimester: Secondary | ICD-10-CM

## 2017-06-05 DIAGNOSIS — Z8759 Personal history of other complications of pregnancy, childbirth and the puerperium: Secondary | ICD-10-CM

## 2017-06-05 DIAGNOSIS — Z8619 Personal history of other infectious and parasitic diseases: Secondary | ICD-10-CM

## 2017-06-05 DIAGNOSIS — Z1379 Encounter for other screening for genetic and chromosomal anomalies: Secondary | ICD-10-CM

## 2017-06-05 DIAGNOSIS — O09899 Supervision of other high risk pregnancies, unspecified trimester: Secondary | ICD-10-CM

## 2017-06-05 DIAGNOSIS — Z3492 Encounter for supervision of normal pregnancy, unspecified, second trimester: Secondary | ICD-10-CM

## 2017-06-05 LAB — POCT URINALYSIS DIPSTICK
BILIRUBIN UA: NEGATIVE
GLUCOSE UA: NEGATIVE
Ketones, UA: NEGATIVE
LEUKOCYTES UA: NEGATIVE
NITRITE UA: NEGATIVE
Protein, UA: NEGATIVE
RBC UA: NEGATIVE
Spec Grav, UA: 1.025 (ref 1.010–1.025)
UROBILINOGEN UA: 0.2 U/dL
pH, UA: 5 (ref 5.0–8.0)

## 2017-06-05 NOTE — Progress Notes (Signed)
Pt is here for a routine OB visit. C/o severe headaches.

## 2017-06-05 NOTE — Patient Instructions (Signed)

## 2017-06-05 NOTE — Progress Notes (Signed)
ROB-Pt doing well, reports headaches with light sensitivity-not currently taking anything to manage symptoms. Discussed pregnancy safe medications and use of home treatment measures. Desires genetic screening; [id number] Plus today, see orders. TOC due to history of chlamydia, see orders. Reviewed red flag symptoms and when to call. RTC x 4 weeks for anatomy scan and ROB or sooner if needed.

## 2017-06-10 ENCOUNTER — Other Ambulatory Visit: Payer: Self-pay | Admitting: Certified Nurse Midwife

## 2017-06-10 DIAGNOSIS — O09299 Supervision of pregnancy with other poor reproductive or obstetric history, unspecified trimester: Secondary | ICD-10-CM | POA: Insufficient documentation

## 2017-06-10 DIAGNOSIS — A749 Chlamydial infection, unspecified: Secondary | ICD-10-CM

## 2017-06-10 DIAGNOSIS — O09899 Supervision of other high risk pregnancies, unspecified trimester: Secondary | ICD-10-CM | POA: Insufficient documentation

## 2017-06-10 DIAGNOSIS — Z8619 Personal history of other infectious and parasitic diseases: Secondary | ICD-10-CM

## 2017-06-10 DIAGNOSIS — Z8759 Personal history of other complications of pregnancy, childbirth and the puerperium: Secondary | ICD-10-CM

## 2017-06-10 DIAGNOSIS — O98812 Other maternal infectious and parasitic diseases complicating pregnancy, second trimester: Principal | ICD-10-CM

## 2017-06-10 LAB — GC/CHLAMYDIA PROBE AMP
CHLAMYDIA, DNA PROBE: POSITIVE — AB
NEISSERIA GONORRHOEAE BY PCR: NEGATIVE

## 2017-06-10 MED ORDER — AZITHROMYCIN 500 MG PO TABS: 1000.0000 mg | ORAL_TABLET | Freq: Once | ORAL | 1 refills | Status: AC

## 2017-06-12 ENCOUNTER — Telehealth: Payer: Self-pay | Admitting: Certified Nurse Midwife

## 2017-06-12 LAB — MATERNIT21  PLUS CORE+ESS+SCA, BLOOD
Chromosome 13: NEGATIVE
Chromosome 18: NEGATIVE
Chromosome 21: NEGATIVE
Y Chromosome: DETECTED

## 2017-06-12 NOTE — Telephone Encounter (Signed)
Called identified line. HIP[employer] approve voicemail asking pt to contact office on Monday.    Gunnar BullaJenkins Michelle Natoshia Souter, CNM

## 2017-06-15 ENCOUNTER — Telehealth: Payer: Self-pay | Admitting: Certified Nurse Midwife

## 2017-06-15 NOTE — Telephone Encounter (Signed)
Please contact pt and see how see would like the results of her MaterniT, I can release them via MyChart or place them in an envelope. Also, make sure she is aware of her lab results. She is still positive for chlamydia and needs treatment plus condoms for a week to fourteen days after her partner has been treated. Thanks, JML

## 2017-06-15 NOTE — Telephone Encounter (Signed)
Spoke with pt- she would like results released through [facility] [facility]. Also advised pt of providers instructions.

## 2017-06-15 NOTE — Telephone Encounter (Signed)
Patient called stating Marcelino DusterMichelle called and lvm on previous Friday, Patient would like for Marcelino DusterMichelle to give her a call back, Patient did not disclose any other information. Please advise.

## 2017-07-03 ENCOUNTER — Encounter: Payer: Self-pay | Admitting: Certified Nurse Midwife

## 2017-07-03 ENCOUNTER — Ambulatory Visit (INDEPENDENT_AMBULATORY_CARE_PROVIDER_SITE_OTHER): Payer: Medicaid Other | Admitting: Certified Nurse Midwife

## 2017-07-03 ENCOUNTER — Other Ambulatory Visit: Payer: Medicaid Other

## 2017-07-03 ENCOUNTER — Encounter: Payer: Medicaid Other | Admitting: Obstetrics and Gynecology

## 2017-07-03 ENCOUNTER — Ambulatory Visit (INDEPENDENT_AMBULATORY_CARE_PROVIDER_SITE_OTHER): Payer: Medicaid Other

## 2017-07-03 VITALS — BP 94/53 | HR 78 | Wt 108.5 lb

## 2017-07-03 DIAGNOSIS — Z3492 Encounter for supervision of normal pregnancy, unspecified, second trimester: Secondary | ICD-10-CM | POA: Diagnosis not present

## 2017-07-03 LAB — POCT URINALYSIS DIPSTICK
Bilirubin, UA: NEGATIVE
GLUCOSE UA: NEGATIVE
Ketones, UA: NEGATIVE
Leukocytes, UA: NEGATIVE
NITRITE UA: NEGATIVE
PH UA: 7 (ref 5.0–8.0)
Protein, UA: NEGATIVE
RBC UA: NEGATIVE
Spec Grav, UA: 1.015 (ref 1.010–1.025)
UROBILINOGEN UA: 0.2 U/dL

## 2017-07-03 NOTE — Patient Instructions (Signed)
Round Ligament Pain The round ligament is a cord of muscle and tissue that helps to support the uterus. It can become a source of pain during pregnancy if it becomes stretched or twisted as the baby grows. The pain usually begins in the second trimester of pregnancy, and it can come and go until the baby is delivered. It is not a serious problem, and it does not cause harm to the baby. Round ligament pain is usually a short, sharp, and pinching pain, but it can also be a dull, lingering, and aching pain. The pain is felt in the lower side of the abdomen or in the groin. It usually starts deep in the groin and moves up to the outside of the hip area. Pain can occur with:  A sudden change in position.  Rolling over in bed.  Coughing or sneezing.  Physical activity.  Follow these instructions at home: Watch your condition for any changes. Take these steps to help with your pain:  When the pain starts, relax. Then try: ? Sitting down. ? Flexing your knees up to your abdomen. ? Lying on your side with one pillow under your abdomen and another pillow between your legs. ? Sitting in a warm bath for 15-20 minutes or until the pain goes away.  Take over-the-counter and prescription medicines only as told by your health care provider.  Move slowly when you sit and stand.  Avoid long walks if they cause pain.  Stop or lessen your physical activities if they cause pain.  Contact a health care provider if:  Your pain does not go away with treatment.  You feel pain in your back that you did not have before.  Your medicine is not helping. Get help right away if:  You develop a fever or chills.  You develop uterine contractions.  You develop vaginal bleeding.  You develop nausea or vomiting.  You develop diarrhea.  You have pain when you urinate. This information is not intended to replace advice given to you by your health care provider. Make sure you discuss any questions you have  with your health care provider. Document Released: 09/16/2008 Document Revised: 05/15/2016 Document Reviewed: 02/14/2015 Elsevier Interactive Patient Education  2018 Else[employer] How a Baby Grows During Pregnancy Pregnancy begins when a female's sperm enters a female's egg (fertilization). This happens in one of the tubes (fallopian tubes) that connect the ovaries to the womb (uterus). The fertilized egg is called an embryo until it reaches 10 weeks. From 10 weeks until birth, it is called a fetus. The fertilized egg moves down the fallopian tube to the uterus. Then it implants into the lining of the uterus and begins to grow. The developing fetus receives oxygen and nutrients through the pregnant woman's bloodstream and the tissues that grow (placenta) to support the fetus. The placenta is the life support system for the fetus. It provides nutrition and removes waste. Learning as much as you can about your pregnancy and how your baby is developing can help you enjoy the experience. It can also make you aware of when there might be a problem and when to ask questions. How long does a typical pregnancy last? A pregnancy usually lasts 280 days, or about 40 weeks. Pregnancy is divided into three trimesters:  First trimester: 0-13 weeks.  Second trimester: 14-27 weeks.  Third trimester: 28-40 weeks.  The day when your baby is considered ready to be born (full term) is your estimated date of delivery. How does   my baby develop month by month? First month  The fertilized egg attaches to the inside of the uterus.  Some cells will form the placenta. Others will form the fetus.  The arms, legs, brain, spinal cord, lungs, and heart begin to develop.  At the end of the first month, the heart begins to beat.  Second month  The bones, inner ear, eyelids, hands, and feet form.  The genitals develop.  By the end of 8 weeks, all major organs are developing.  Third month  All of the internal  organs are forming.  Teeth develop below the gums.  Bones and muscles begin to grow. The spine can flex.  The skin is transparent.  Fingernails and toenails begin to form.  Arms and legs continue to grow longer, and hands and feet develop.  The fetus is about 3 in (7.6 cm) long.  Fourth month  The placenta is completely formed.  The external sex organs, neck, outer ear, eyebrows, eyelids, and fingernails are formed.  The fetus can hear, swallow, and move its arms and legs.  The kidneys begin to produce urine.  The skin is covered with a white waxy coating (vernix) and very fine hair (lanugo).  Fifth month  The fetus moves around more and can be felt for the first time (quickening).  The fetus starts to sleep and wake up and may begin to suck its finger.  The nails grow to the end of the fingers.  The organ in the digestive system that makes bile (gallbladder) functions and helps to digest the nutrients.  If your baby is a girl, eggs are present in her ovaries. If your baby is a boy, testicles start to move down into his scrotum.  Sixth month  The lungs are formed, but the fetus is not yet able to breathe.  The eyes open. The brain continues to develop.  Your baby has fingerprints and toe prints. Your baby's hair grows thicker.  At the end of the second trimester, the fetus is about 9 in (22.9 cm) long.  Seventh month  The fetus kicks and stretches.  The eyes are developed enough to sense changes in light.  The hands can make a grasping motion.  The fetus responds to sound.  Eighth month  All organs and body systems are fully developed and functioning.  Bones harden and taste buds develop. The fetus may hiccup.  Certain areas of the brain are still developing. The skull remains soft.  Ninth month  The fetus gains about  lb (0.23 kg) each week.  The lungs are fully developed.  Patterns of sleep develop.  The fetus's head typically moves into a  head-down position (vertex) in the uterus to prepare for birth. If the buttocks move into a vertex position instead, the baby is breech.  The fetus weighs 6-9 lbs (2.72-4.08 kg) and is 19-20 in (48.26-50.8 cm) long.  What can I do to have a healthy pregnancy and help my baby develop? Eating and Drinking  Eat a healthy diet. ? Talk with your health care provider to make sure that you are getting the nutrients that you and your baby need. ? Visit www.choosemyplate.gov to learn about creating a healthy diet.  Gain a healthy amount of weight during pregnancy as advised by your health care provider. This is usually 25-35 pounds. You may need to: ? Gain more if you were underweight before getting pregnant or if you are pregnant with more than one baby. ? Gain less   if you were overweight or obese when you got pregnant.  Medicines and Vitamins  Take prenatal vitamins as directed by your health care provider. These include vitamins such as folic acid, iron, calcium, and vitamin D. They are important for healthy development.  Take medicines only as directed by your health care provider. Read labels and ask a pharmacist or your health care provider whether over-the-counter medicines, supplements, and prescription drugs are safe to take during pregnancy.  Activities  Be physically active as advised by your health care provider. Ask your health care provider to recommend activities that are safe for you to do, such as walking or swimming.  Do not participate in strenuous or extreme sports.  Lifestyle  Do not drink alcohol.  Do not use any tobacco products, including cigarettes, chewing tobacco, or electronic cigarettes. If you need help quitting, ask your health care provider.  Do not use illegal drugs.  Safety  Avoid exposure to mercury, lead, or other heavy metals. Ask your health care provider about common sources of these heavy metals.  Avoid listeria infection during pregnancy. Follow  these precautions: ? Do not eat soft cheeses or deli meats. ? Do not eat hot dogs unless they have been warmed up to the point of steaming, such as in the microwave oven. ? Do not drink unpasteurized milk.  Avoid toxoplasmosis infection during pregnancy. Follow these precautions: ? Do not change your cat's litter box, if you have a cat. Ask someone else to do this for you. ? Wear gardening gloves while working in the yard.  General Instructions  Keep all follow-up visits as directed by your health care provider. This is important. This includes prenatal care and screening tests.  Manage any chronic health conditions. Work closely with your health care provider to keep conditions, such as diabetes, under control.  How do I know if my baby is developing well? At each prenatal visit, your health care provider will do several different tests to check on your health and keep track of your baby's development. These include:  Fundal height. ? Your health care provider will measure your growing belly from top to bottom using a tape measure. ? Your health care provider will also feel your belly to determine your baby's position.  Heartbeat. ? An ultrasound in the first trimester can confirm pregnancy and show a heartbeat, depending on how far along you are. ? Your health care provider will check your baby's heart rate at every prenatal visit. ? As you get closer to your delivery date, you may have regular fetal heart rate monitoring to make sure that your baby is not in distress.  Second trimester ultrasound. ? This ultrasound checks your baby's development. It also indicates your baby's gender.  What should I do if I have concerns about my baby's development? Always talk with your health care provider about any concerns that you may have. This information is not intended to replace advice given to you by your health care provider. Make sure you discuss any questions you have with your health  care provider. Document Released: 05/26/2008 Document Revised: 05/15/2016 Document Reviewed: 05/17/2014 Elsevier Interactive Patient Education  2018 Elsevier Inc.  

## 2017-07-03 NOTE — Progress Notes (Signed)
  ROB, doing well. Anatomy scan today shows a normal  Scan. See below for full report. Test of cure for positive chlamydia on 07/03/17. She denies any change in right breast lump. Follow up in 4 wks.   Doreene BurkeAnnie Jenaya Saar, CNM  ULTRASOUND REPORT Location: [facility] Date of Service: 07/03/17  Indications:Anatomy U/S Findings:  Mason JimSingleton intrauterine pregnancy is visualized with FHR at 140 BPM. Biometrics give an (U/S) Gestational age of 19 0/7 weeks and an (U/S) EDD of 11/20/17; this correlates with the clinically established EDD of 12/21/17.  Fetal presentation is Breech.  EFW: 328g (12 oz). Placenta: posterior, grade 0, 3.5 cm from cervix. AFI: Adequate with MVP of 4.1 cm.  Anatomic survey is complete and normal; Gender - female  .   Ovaries are not seen. Survey of the adnexa demonstrates no adnexal masses. There is no free peritoneal fluid in the cul de sac.  Impression: 1. 20 week Viable Singleton Intrauterine pregnancy by U/S. 2. (U/S) EDD is consistent with Clinically established (LMP) EDD of 12/21/17. 3. Normal Anatomy Scan  Recommendations: 1.Clinical correlation with the patient's History and Physical Exam.

## 2017-07-03 NOTE — Progress Notes (Signed)
Pt is here for an OB visit. 

## 2017-07-10 ENCOUNTER — Other Ambulatory Visit: Payer: Self-pay | Admitting: Certified Nurse Midwife

## 2017-07-10 ENCOUNTER — Encounter: Payer: Self-pay | Admitting: Certified Nurse Midwife

## 2017-07-10 LAB — NUSWAB VAGINITIS PLUS (VG+)
Atopobium vaginae: HIGH Score — AB
CHLAMYDIA TRACHOMATIS, NAA: NEGATIVE
Candida albicans, NAA: NEGATIVE
Candida glabrata, NAA: NEGATIVE
MEGASPHAERA 1: HIGH {score} — AB
NEISSERIA GONORRHOEAE, NAA: NEGATIVE
TRICH VAG BY NAA: NEGATIVE

## 2017-07-10 MED ORDER — METRONIDAZOLE 500 MG PO TABS: 500.0000 mg | ORAL_TABLET | Freq: Two times a day (BID) | ORAL | 0 refills | Status: AC

## 2017-07-10 NOTE — Progress Notes (Signed)
Nuswab positive for BV. My chart message sent.  Doreene BurkeAnnie Mabelle Ward, CNM

## 2017-07-31 ENCOUNTER — Ambulatory Visit (INDEPENDENT_AMBULATORY_CARE_PROVIDER_SITE_OTHER): Payer: Medicaid Other | Admitting: Certified Nurse Midwife

## 2017-07-31 ENCOUNTER — Encounter: Payer: Self-pay | Admitting: Certified Nurse Midwife

## 2017-07-31 VITALS — BP 84/44 | HR 76 | Wt 118.3 lb

## 2017-07-31 DIAGNOSIS — Z130 Encounter for screening for diseases of the blood and blood-forming organs and certain disorders involving the immune mechanism: Secondary | ICD-10-CM

## 2017-07-31 DIAGNOSIS — Z3492 Encounter for supervision of normal pregnancy, unspecified, second trimester: Secondary | ICD-10-CM

## 2017-07-31 DIAGNOSIS — Z131 Encounter for screening for diabetes mellitus: Secondary | ICD-10-CM

## 2017-07-31 LAB — POCT URINALYSIS DIPSTICK
Bilirubin, UA: NEGATIVE
Glucose, UA: NEGATIVE
KETONES UA: NEGATIVE
Leukocytes, UA: NEGATIVE
Nitrite, UA: NEGATIVE
PH UA: 7.5 (ref 5.0–8.0)
Protein, UA: NEGATIVE
RBC UA: NEGATIVE
Spec Grav, UA: 1.01 (ref 1.010–1.025)
Urobilinogen, UA: 0.2 E.U./dL

## 2017-07-31 NOTE — Progress Notes (Signed)
ROB- no complaints.  

## 2017-07-31 NOTE — Patient Instructions (Signed)

## 2017-07-31 NOTE — Progress Notes (Signed)
ROB-Pt doing well, no questions or concerns. Discussed selection of pediatrician. Anticipatory guidance regarding 28 week labs. Reviewed red flag symptoms and when to call. RTC x 4 weeks for glucola, TDaP and ROB.

## 2017-08-28 ENCOUNTER — Other Ambulatory Visit: Payer: Medicaid Other

## 2017-08-28 ENCOUNTER — Ambulatory Visit (INDEPENDENT_AMBULATORY_CARE_PROVIDER_SITE_OTHER): Payer: Medicaid Other | Admitting: Certified Nurse Midwife

## 2017-08-28 VITALS — BP 115/71 | HR 76 | Wt 123.6 lb

## 2017-08-28 DIAGNOSIS — Z131 Encounter for screening for diabetes mellitus: Secondary | ICD-10-CM

## 2017-08-28 DIAGNOSIS — Z3403 Encounter for supervision of normal first pregnancy, third trimester: Secondary | ICD-10-CM

## 2017-08-28 DIAGNOSIS — Z23 Encounter for immunization: Secondary | ICD-10-CM

## 2017-08-28 DIAGNOSIS — Z130 Encounter for screening for diseases of the blood and blood-forming organs and certain disorders involving the immune mechanism: Secondary | ICD-10-CM

## 2017-08-28 LAB — POCT URINALYSIS DIPSTICK
BILIRUBIN UA: NEGATIVE
GLUCOSE UA: NEGATIVE
Ketones, UA: NEGATIVE
NITRITE UA: NEGATIVE
PH UA: 8 (ref 5.0–8.0)
Protein, UA: NEGATIVE
RBC UA: NEGATIVE
SPEC GRAV UA: 1.01 (ref 1.010–1.025)
UROBILINOGEN UA: 0.2 U/dL

## 2017-08-28 MED ORDER — TETANUS-DIPHTH-ACELL PERTUSSIS 5-2.5-18.5 LF-MCG/0.5 IM SUSP
0.5000 mL | Freq: Once | INTRAMUSCULAR | Status: AC
  Administered 2017-08-28: 0.5 mL via INTRAMUSCULAR

## 2017-08-28 NOTE — Patient Instructions (Signed)
Glucose Tolerance Test During Pregnancy The glucose tolerance test (GTT) is a blood test used to determine if you have developed a type of diabetes during pregnancy (gestational diabetes). This is when your body does not properly process sugar (glucose) in the food you eat, resulting in high blood glucose levels. Typically, a GTT is done after you have had a 1-hour glucose test with results that indicate you possibly have gestational diabetes. It may also be done if:  You have a history of giving birth to very large babies or have experienced repeated fetal loss (stillbirth).  You have signs and symptoms of diabetes, such as: ? Changes in your vision. ? Tingling or numbness in your hands or feet. ? Changes in hunger, thirst, and urination not otherwise explained by your pregnancy.  The GTT lasts about 3 hours. You will be given a sugar-water solution to drink at the beginning of the test. You will have blood drawn before you drink the solution and then again 1, 2, and 3 hours after you drink it. You will not be allowed to eat or drink anything else during the test. You must remain at the testing location to make sure that your blood is drawn on time. You should also avoid exercising during the test, because exercise can alter test results. How do I prepare for this test? Eat normally for 3 days prior to the GTT test, including having plenty of carbohydrate-rich foods. Do not eat or drink anything except water during the final 12 hours before the test. In addition, your health care provider may ask you to stop taking certain medicines before the test. What do the results mean? It is your responsibility to obtain your test results. Ask the lab or department performing the test when and how you will get your results. Contact your health care provider to discuss any questions you have about your results. Range of Normal Values Ranges for normal values may vary among different labs and hospitals. You  should always check with your health care provider after having lab work or other tests done to discuss whether your values are considered within normal limits. Normal levels of blood glucose are as follows:  Fasting: less than 105 mg/dL.  1 hour after drinking the solution: less than 190 mg/dL.  2 hours after drinking the solution: less than 165 mg/dL.  3 hours after drinking the solution: less than 145 mg/dL.  Some substances can interfere with GTT results. These may include:  Blood pressure and heart failure medicines, including beta blockers, furosemide, and thiazides.  Anti-inflammatory medicines, including aspirin.  Nicotine.  Some psychiatric medicines.  Meaning of Results Outside Normal Value Ranges GTT test results that are above normal values may indicate a number of health problems, such as:  Gestational diabetes.  Acute stress response.  Cushing syndrome.  Tumors such as pheochromocytoma or glucagonoma.  Long-term kidney problems.  Pancreatitis.  Hyperthyroidism.  Current infection.  Discuss your test results with your health care provider. He or she will use the results to make a diagnosis and determine a treatment plan that is right for you. This information is not intended to replace advice given to you by your health care provider. Make sure you discuss any questions you have with your health care provider. Document Released: 06/08/2012 Document Revised: 05/15/2016 Document Reviewed: 04/14/2014 Elsevier Interactive Patient Education  2017 [employer] Inc[employer] Td Vaccine (Tetanus and Diphtheria): What You Need to Know 1. Why get vaccinated? Tetanus  and diphtheria are very serious  diseases. They are rare in the [place] today, but people who do become infected often have severe complications. Td vaccine is used to protect adolescents and adults from both of these diseases. Both tetanus and diphtheria are infections caused by bacteria. Diphtheria spreads  from person to person through coughing or sneezing. Tetanus-causing bacteria enter the body through cuts, scratches, or wounds. TETANUS (lockjaw) causes painful muscle tightening and stiffness, usually all over the body.  It can lead to tightening of muscles in the head and neck so you can't open your mouth, swallow, or sometimes even breathe. Tetanus kills about 1 out of every 10 people who are infected even after receiving the best medical care.  DIPHTHERIA can cause a thick coating to form in the back of the throat.  It can lead to breathing problems, paralysis, heart failure, and death.  Before vaccines, as many as 200,000 cases of diphtheria and hundreds of cases of tetanus were reported in the [place] each year. Since vaccination began, reports of cases for both diseases have dropped by about 99%. 2. Td vaccine Td vaccine can protect adolescents and adults from tetanus and diphtheria. Td is usually given as a booster dose every 10 years but it can also be given earlier after a severe and dirty wound or burn. Another vaccine, called Tdap, which protects against pertussis in addition to tetanus and diphtheria, is sometimes recommended instead of Td vaccine. Your doctor or the person giving you the vaccine can give you more information. Td may safely be given at the same time as other vaccines. 3. Some people should not get this vaccine  A person who has ever had a life-threatening allergic reaction after a previous dose of any tetanus or diphtheria containing vaccine, OR has a severe allergy to any part of this vaccine, should not get Td vaccine. Tell the person giving the vaccine about any severe allergies.  Talk to your doctor if you: ? had severe pain or swelling after any vaccine containing diphtheria or tetanus, ? ever had a condition called Guillain Barre Syndrome (GBS), ? aren't feeling well on the day the shot is scheduled. 4. What are the risks from Td vaccine? With any  medicine, including vaccines, there is a chance of side effects. These are usually mild and go away on their own. Serious reactions are also possible but are rare. Most people who get Td vaccine do not have any problems with it. Mild problems following Td vaccine: (Did not interfere with activities)  Pain where the shot was given (about 8 people in 10)  Redness or swelling where the shot was given (about 1 person in 4)  Mild fever (rare)  Headache (about 1 person in 4)  Tiredness (about 1 person in 4)  Moderate problems following Td vaccine: (Interfered with activities, but did not require medical attention)  Fever over 102F (rare)  Severe problems following Td vaccine: (Unable to perform usual activities; required medical attention)  Swelling, severe pain, bleeding and/or redness in the arm where the shot was given (rare).  Problems that could happen after any vaccine:  People sometimes faint after a medical procedure, including vaccination. Sitting or lying down for about 15 minutes can help prevent fainting, and injuries caused by a fall. Tell your doctor if you feel dizzy, or have vision changes or ringing in the ears.  Some people get severe pain in the shoulder and have difficulty moving the arm where a shot was given. This happens very rarely.  Any medication can cause a severe allergic reaction. Such reactions from a vaccine are very rare, estimated at fewer than 1 in a million doses, and would happen within a few minutes to a few hours after the vaccination. As with any medicine, there is a very remote chance of a vaccine causing a serious injury or death. The safety of vaccines is always being monitored. For more information, visit: http://floyd.org/www.cdc.gov/vaccinesafety/ 5. What if there is a serious reaction? What should I look for? Look for anything that concerns you, such as signs of a severe allergic reaction, very high fever, or unusual behavior. Signs of a severe allergic  reaction can include hives, swelling of the face and throat, difficulty breathing, a fast heartbeat, dizziness, and weakness. These would usually start a few minutes to a few hours after the vaccination. What should I do?  If you think it is a severe allergic reaction or other emergency that can't wait, call 9-1-1 or get the person to the nearest hospital. Otherwise, call your doctor.  Afterward, the reaction should be reported to the Vaccine Adverse Event Reporting System (VAERS). Your doctor might file this report, or you can do it yourself through the VAERS web site at www.vaers.LAgents.nohhs.gov, or by calling 1-(463)604-3170. ? VAERS does not give medical advice. 6. The National Vaccine Injury Compensation Program The [employer] Vaccine Injury Compensation Program (VICP) is a federal program that was created to compensate people who may have been injured by certain vaccines. Persons who believe they may have been injured by a vaccine can learn about the program and about filing a claim by calling 1-6097388059 or visiting the VICP website at SpiritualWord.atwww.hrsa.gov/vaccinecompensation. There is a time limit to file a claim for compensation. 7. How can I learn more?  Ask your doctor. He or she can give you the vaccine package insert or suggest other sources of information.  Call your local or state health department.  Contact the Centers for Disease Control and Prevention (CDC): ? Call (757)459-54001-8381084452 (1-800-CDC-INFO) ? Visit CDC's website at PicCapture.uywww.cdc.gov/vaccines CDC Td Vaccine VIS (04/01/16) This information is not intended to replace advice given to you by your health care provider. Make sure you discuss any questions you have with your health care provider. Document Released: 10/05/2006 Document Revised: 08/28/2016 Document Reviewed: 08/28/2016 Elsevier Interactive Patient Education  2017 [employer]

## 2017-08-28 NOTE — Progress Notes (Signed)
ROB, doing well. No complaints. Discussed BTC, TDAp, CBC, and 1 hr GTT today. She verbalizes understanding. Will follow up with result. Reviewed fetal movement and PTL. Follow up in 2 wks .   Doreene BurkeAnnie Azaleah Usman, CNM

## 2017-08-29 LAB — CBC WITH DIFFERENTIAL/PLATELET
BASOS ABS: 0 10*3/uL (ref 0.0–0.2)
Basos: 0 %
EOS (ABSOLUTE): 0.4 10*3/uL (ref 0.0–0.4)
Eos: 3 %
Hematocrit: 31.1 % — ABNORMAL LOW (ref 34.0–46.6)
Hemoglobin: 10.6 g/dL — ABNORMAL LOW (ref 11.1–15.9)
Immature Grans (Abs): 0.2 10*3/uL — ABNORMAL HIGH (ref 0.0–0.1)
Immature Granulocytes: 1 %
Lymphocytes Absolute: 1.8 10*3/uL (ref 0.7–3.1)
Lymphs: 13 %
MCH: 31.4 pg (ref 26.6–33.0)
MCHC: 34.1 g/dL (ref 31.5–35.7)
MCV: 92 fL (ref 79–97)
MONOS ABS: 0.6 10*3/uL (ref 0.1–0.9)
Monocytes: 4 %
Neutrophils Absolute: 10.4 10*3/uL — ABNORMAL HIGH (ref 1.4–7.0)
Neutrophils: 79 %
PLATELETS: 200 10*3/uL (ref 150–379)
RBC: 3.38 x10E6/uL — ABNORMAL LOW (ref 3.77–5.28)
RDW: 13.3 % (ref 12.3–15.4)
WBC: 13.4 10*3/uL — ABNORMAL HIGH (ref 3.4–10.8)

## 2017-08-29 LAB — GLUCOSE, 1 HOUR GESTATIONAL: GESTATIONAL DIABETES SCREEN: 105 mg/dL (ref 65–139)

## 2017-08-31 ENCOUNTER — Encounter: Payer: Self-pay | Admitting: Certified Nurse Midwife

## 2017-09-10 ENCOUNTER — Encounter: Payer: Medicaid Other | Admitting: Obstetrics and Gynecology

## 2017-09-18 ENCOUNTER — Ambulatory Visit (INDEPENDENT_AMBULATORY_CARE_PROVIDER_SITE_OTHER): Payer: Medicaid Other | Admitting: Obstetrics and Gynecology

## 2017-09-18 VITALS — BP 118/63 | HR 63 | Wt 129.0 lb

## 2017-09-18 DIAGNOSIS — Z23 Encounter for immunization: Secondary | ICD-10-CM | POA: Diagnosis not present

## 2017-09-18 DIAGNOSIS — Z3493 Encounter for supervision of normal pregnancy, unspecified, third trimester: Secondary | ICD-10-CM

## 2017-09-18 LAB — POCT URINALYSIS DIPSTICK
Bilirubin, UA: NEGATIVE
GLUCOSE UA: NEGATIVE
KETONES UA: NEGATIVE
Leukocytes, UA: NEGATIVE
Nitrite, UA: NEGATIVE
Protein, UA: NEGATIVE
RBC UA: NEGATIVE
SPEC GRAV UA: 1.01 (ref 1.010–1.025)
UROBILINOGEN UA: 0.2 U/dL
pH, UA: 6.5 (ref 5.0–8.0)

## 2017-09-18 NOTE — Progress Notes (Signed)
ROB-flu vaccine given; plans circumcision, breast feeding. Discussed enrolling in classes.

## 2017-09-18 NOTE — Progress Notes (Signed)
ROB- pt is doing well, flu vaccine given 

## 2017-10-02 ENCOUNTER — Ambulatory Visit (INDEPENDENT_AMBULATORY_CARE_PROVIDER_SITE_OTHER): Payer: Medicaid Other | Admitting: Certified Nurse Midwife

## 2017-10-02 ENCOUNTER — Encounter: Payer: Self-pay | Admitting: Certified Nurse Midwife

## 2017-10-02 VITALS — BP 101/71 | HR 83 | Wt 131.7 lb

## 2017-10-02 DIAGNOSIS — Z3493 Encounter for supervision of normal pregnancy, unspecified, third trimester: Secondary | ICD-10-CM

## 2017-10-02 LAB — POCT URINALYSIS DIPSTICK
Bilirubin, UA: NEGATIVE
Glucose, UA: NEGATIVE
KETONES UA: NEGATIVE
Nitrite, UA: NEGATIVE
PH UA: 7.5 (ref 5.0–8.0)
Protein, UA: NEGATIVE
RBC UA: NEGATIVE
SPEC GRAV UA: 1.015 (ref 1.010–1.025)
UROBILINOGEN UA: 0.2 U/dL

## 2017-10-02 MED ORDER — LIDOCAINE 5 % EX OINT: 1.0000 "application " | TOPICAL_OINTMENT | CUTANEOUS | 0 refills | Status: DC | PRN

## 2017-10-02 NOTE — Progress Notes (Signed)
ROB- C/o of upper back pain. Heating pad and ice help.

## 2017-10-02 NOTE — Patient Instructions (Signed)

## 2017-10-02 NOTE — Progress Notes (Signed)
ROB-Pt doing well. Discussed home treatment measures for back pain in pregnancy. Rx Lidocaine patch. Reviewed red flag symptoms and when to call. RTC x 2 weeks for ROB with Pattricia Boss or sooner if needed.

## 2017-10-16 ENCOUNTER — Ambulatory Visit (INDEPENDENT_AMBULATORY_CARE_PROVIDER_SITE_OTHER): Payer: Medicaid Other | Admitting: Certified Nurse Midwife

## 2017-10-16 ENCOUNTER — Encounter: Payer: Self-pay | Admitting: Certified Nurse Midwife

## 2017-10-16 VITALS — BP 122/75 | HR 89 | Wt 135.2 lb

## 2017-10-16 DIAGNOSIS — R82998 Other abnormal findings in urine: Secondary | ICD-10-CM

## 2017-10-16 DIAGNOSIS — Z3493 Encounter for supervision of normal pregnancy, unspecified, third trimester: Secondary | ICD-10-CM

## 2017-10-16 LAB — POCT URINALYSIS DIPSTICK
Bilirubin (Urine): NEGATIVE
Blood, UA: NEGATIVE
Glucose, UA: NEGATIVE
KETONES UA: NEGATIVE
Nitrite, UA: NEGATIVE
PH UA: 6 (ref 5.0–8.0)
PROTEIN UA: NEGATIVE
Spec Grav, UA: 1.02 (ref 1.010–1.025)
UROBILINOGEN UA: 0.2 U/dL

## 2017-10-16 NOTE — Progress Notes (Signed)
ROB- shooting pain from pelvis to vagina. Happen when she is at work.

## 2017-10-16 NOTE — Patient Instructions (Signed)
Group B Streptococcus Infection During Pregnancy Group B Streptococcus (GBS) is a type of bacteria (Streptococcus agalactiae) that is often found in healthy people, commonly in the rectum, vagina, and intestines. In people who are healthy and not pregnant, the bacteria rarely cause serious illness or complications. However, women who test positive for GBS during pregnancy can pass the bacteria to their baby during childbirth, which can cause serious infection in the baby after birth. Women with GBS may also have infections during their pregnancy or immediately after childbirth, such as such as urinary tract infections (UTIs) or infections of the uterus (uterine infections). Having GBS also increases a woman's risk of complications during pregnancy, such as early (preterm) labor or delivery, miscarriage, or stillbirth. Routine testing (screening) for GBS is recommended for all pregnant women. What increases the risk? You may have a higher risk for GBS infection during pregnancy if you had one during a past pregnancy. What are the signs or symptoms? In most cases, GBS infection does not cause symptoms in pregnant women. Signs and symptoms of a possible GBS-related infection may include:  Labor starting before the 37th week of pregnancy.  A UTI or bladder infection, which may cause: ? Fever. ? Pain or burning during urination. ? Frequent urination.  Fever during labor, along with: ? Bad-smelling discharge. ? Uterine tenderness. ? Rapid heartbeat in the mother, baby, or both.  Rare but serious symptoms of a possible GBS-related infection in women include:  Blood infection (septicemia). This may cause fever, chills, or confusion.  Lung infection (pneumonia). This may cause fever, chills, cough, rapid breathing, difficulty breathing, or chest pain.  Bone, joint, skin, or soft tissue infection.  How is this diagnosed? You may be screened for GBS between week 35 and week 37 of your pregnancy. If  you have symptoms of preterm labor, you may be screened earlier. This condition is diagnosed based on lab test results from:  A swab of fluid from the vagina and rectum.  A urine sample.  How is this treated? This condition is treated with antibiotic medicine. When you go into labor, or as soon as your water breaks (your membranes rupture), you will be given antibiotics through an IV tube. Antibiotics will continue until after you give birth. If you are having a cesarean delivery, you do not need antibiotics unless your membranes have already ruptured. Follow these instructions at home:  Take over-the-counter and prescription medicines only as told by your health care provider.  Take your antibiotic medicine as told by your health care provider. Do not stop taking the antibiotic even if you start to feel better.  Keep all pre-birth (prenatal) visits and follow-up visits as told by your health care provider. This is important. Contact a health care provider if:  You have pain or burning when you urinate.  You have to urinate frequently.  You have a fever or chills.  You develop a bad-smelling vaginal discharge. Get help right away if:  Your membranes rupture.  You go into labor.  You have severe pain in your abdomen.  You have difficulty breathing.  You have chest pain. This information is not intended to replace advice given to you by your health care provider. Make sure you discuss any questions you have with your health care provider. Document Released: 03/16/2008 Document Revised: 07/04/2016 Document Reviewed: 07/03/2016 Elsevier Interactive Patient Education  2018 Elsevier Inc.  

## 2017-10-16 NOTE — Progress Notes (Signed)
ROB doing well. No complaints. Endorses good fetal movement. GBS testing next visit , reviewed with pt. Follow up 2 wks.   Doreene BurkeAnnie Keon Benscoter, CNM

## 2017-10-18 LAB — URINE CULTURE

## 2017-10-30 ENCOUNTER — Encounter: Payer: Medicaid Other | Admitting: Obstetrics and Gynecology

## 2017-10-30 ENCOUNTER — Ambulatory Visit (INDEPENDENT_AMBULATORY_CARE_PROVIDER_SITE_OTHER): Payer: Medicaid Other | Admitting: Obstetrics and Gynecology

## 2017-10-30 VITALS — BP 108/66 | HR 91 | Wt 139.0 lb

## 2017-10-30 DIAGNOSIS — Z3493 Encounter for supervision of normal pregnancy, unspecified, third trimester: Secondary | ICD-10-CM

## 2017-10-30 DIAGNOSIS — Z113 Encounter for screening for infections with a predominantly sexual mode of transmission: Secondary | ICD-10-CM

## 2017-10-30 DIAGNOSIS — Z3685 Encounter for antenatal screening for Streptococcus B: Secondary | ICD-10-CM

## 2017-10-30 LAB — POCT URINALYSIS DIPSTICK
Bilirubin, UA: NEGATIVE
Blood, UA: NEGATIVE
Glucose, UA: NEGATIVE
Ketones, UA: NEGATIVE
Leukocytes, UA: NEGATIVE
Nitrite, UA: NEGATIVE
Protein, UA: NEGATIVE
Spec Grav, UA: 1.015 (ref 1.010–1.025)
Urobilinogen, UA: 0.2 U/dL
pH, UA: 6 (ref 5.0–8.0)

## 2017-10-30 NOTE — Progress Notes (Signed)
ROB- cultures obtained, pt is doing well 

## 2017-10-30 NOTE — Patient Instructions (Signed)

## 2017-10-30 NOTE — Progress Notes (Signed)
ROB & Cultures obtained,FOB, dad and FOB's mom for labor support.

## 2017-11-01 LAB — GC/CHLAMYDIA PROBE AMP
Chlamydia trachomatis, NAA: NEGATIVE
Neisseria gonorrhoeae by PCR: NEGATIVE

## 2017-11-01 LAB — STREP GP B NAA: STREP GROUP B AG: NEGATIVE

## 2017-11-05 ENCOUNTER — Encounter: Payer: Self-pay | Admitting: Certified Nurse Midwife

## 2017-11-05 ENCOUNTER — Ambulatory Visit (INDEPENDENT_AMBULATORY_CARE_PROVIDER_SITE_OTHER): Payer: Medicaid Other | Admitting: Certified Nurse Midwife

## 2017-11-05 VITALS — BP 99/60 | HR 86 | Wt 140.4 lb

## 2017-11-05 DIAGNOSIS — Z3493 Encounter for supervision of normal pregnancy, unspecified, third trimester: Secondary | ICD-10-CM

## 2017-11-05 LAB — POCT URINALYSIS DIPSTICK
BILIRUBIN UA: NEGATIVE
GLUCOSE UA: NEGATIVE
KETONES UA: NEGATIVE
NITRITE UA: NEGATIVE
Protein, UA: NEGATIVE
RBC UA: NEGATIVE
Urobilinogen, UA: 0.2 E.U./dL
pH, UA: 6 (ref 5.0–8.0)

## 2017-11-05 NOTE — Progress Notes (Signed)
ROB- Pt states she is doing well today, she has some pressure, did have some slight back pain last night at work but has subsided

## 2017-11-05 NOTE — Patient Instructions (Signed)
Vaginal Delivery Vaginal delivery means that you will give birth by pushing your baby out of your birth canal (vagina). A team of health care providers will help you before, during, and after vaginal delivery. Birth experiences are unique for every woman and every pregnancy, and birth experiences vary depending on where you choose to give birth. What should I do to prepare for my baby's birth? Before your baby is born, it is important to talk with your health care provider about:  Your labor and delivery preferences. These may include: ? Medicines that you may be given. ? How you will manage your pain. This might include non-medical pain relief techniques or injectable pain relief such as epidural analgesia. ? How you and your baby will be monitored during labor and delivery. ? Who may be in the labor and delivery room with you. ? Your feelings about surgical delivery of your baby (cesarean delivery, or C-section) if this becomes necessary. ? Your feelings about receiving donated blood through an IV tube (blood transfusion) if this becomes necessary.  Whether you are able: ? To take pictures or videos of the birth. ? To eat during labor and delivery. ? To move around, walk, or change positions during labor and delivery.  What to expect after your baby is born, such as: ? Whether delayed umbilical cord clamping and cutting is offered. ? Who will care for your baby right after birth. ? Medicines or tests that may be recommended for your baby. ? Whether breastfeeding is supported in your hospital or birth center. ? How long you will be in the hospital or birth center.  How any medical conditions you have may affect your baby or your labor and delivery experience.  To prepare for your baby's birth, you should also:  Attend all of your health care visits before delivery (prenatal visits) as recommended by your health care provider. This is important.  Prepare your home for your baby's  arrival. Make sure that you have: ? Diapers. ? Baby clothing. ? Feeding equipment. ? Safe sleeping arrangements for you and your baby.  Install a car seat in your vehicle. Have your car seat checked by a certified car seat installer to make sure that it is installed safely.  Think about who will help you with your new baby at home for at least the first several weeks after delivery.  What can I expect when I arrive at the birth center or hospital? Once you are in labor and have been admitted into the hospital or birth center, your health care provider may:  Review your pregnancy history and any concerns you have.  Insert an IV tube into one of your veins. This is used to give you fluids and medicines.  Check your blood pressure, pulse, temperature, and heart rate (vital signs).  Check whether your bag of water (amniotic sac) has broken (ruptured).  Talk with you about your birth plan and discuss pain control options.  Monitoring Your health care provider may monitor your contractions (uterine monitoring) and your baby's heart rate (fetal monitoring). You may need to be monitored:  Often, but not continuously (intermittently).  All the time or for long periods at a time (continuously). Continuous monitoring may be needed if: ? You are taking certain medicines, such as medicine to relieve pain or make your contractions stronger. ? You have pregnancy or labor complications.  Monitoring may be done by:  Placing a special stethoscope or a handheld monitoring device on your abdomen to   check your baby's heartbeat, and feeling your abdomen for contractions. This method of monitoring does not continuously record your baby's heartbeat or your contractions.  Placing monitors on your abdomen (external monitors) to record your baby's heartbeat and the frequency and length of contractions. You may not have to wear external monitors all the time.  Placing monitors inside of your uterus  (internal monitors) to record your baby's heartbeat and the frequency, length, and strength of your contractions. ? Your health care provider may use internal monitors if he or she needs more information about the strength of your contractions or your baby's heart rate. ? Internal monitors are put in place by passing a thin, flexible wire through your vagina and into your uterus. Depending on the type of monitor, it may remain in your uterus or on your baby's head until birth. ? Your health care provider will discuss the benefits and risks of internal monitoring with you and will ask for your permission before inserting the monitors.  Telemetry. This is a type of continuous monitoring that can be done with external or internal monitors. Instead of having to stay in bed, you are able to move around during telemetry. Ask your health care provider if telemetry is an option for you.  Physical exam Your health care provider may perform a physical exam. This may include:  Checking whether your baby is positioned: ? With the head toward your vagina (head-down). This is most common. ? With the head toward the top of your uterus (head-up or breech). If your baby is in a breech position, your health care provider may try to turn your baby to a head-down position so you can deliver vaginally. If it does not seem that your baby can be born vaginally, your provider may recommend surgery to deliver your baby. In rare cases, you may be able to deliver vaginally if your baby is head-up (breech delivery). ? Lying sideways (transverse). Babies that are lying sideways cannot be delivered vaginally.  Checking your cervix to determine: ? Whether it is thinning out (effacing). ? Whether it is opening up (dilating). ? How low your baby has moved into your birth canal.  What are the three stages of labor and delivery?  Normal labor and delivery is divided into the following three stages: Stage 1  Stage 1 is the  longest stage of labor, and it can last for hours or days. Stage 1 includes: ? Early labor. This is when contractions may be irregular, or regular and mild. Generally, early labor contractions are more than 10 minutes apart. ? Active labor. This is when contractions get longer, more regular, more frequent, and more intense. ? The transition phase. This is when contractions happen very close together, are very intense, and may last longer than during any other part of labor.  Contractions generally feel mild, infrequent, and irregular at first. They get stronger, more frequent (about every 2-3 minutes), and more regular as you progress from early labor through active labor and transition.  Many women progress through stage 1 naturally, but you may need help to continue making progress. If this happens, your health care provider may talk with you about: ? Rupturing your amniotic sac if it has not ruptured yet. ? Giving you medicine to help make your contractions stronger and more frequent.  Stage 1 ends when your cervix is completely dilated to 4 inches (10 cm) and completely effaced. This happens at the end of the transition phase. Stage 2  Once   your cervix is completely effaced and dilated to 4 inches (10 cm), you may start to feel an urge to push. It is common for the body to naturally take a rest before feeling the urge to push, especially if you received an epidural or certain other pain medicines. This rest period may last for up to 1-2 hours, depending on your unique labor experience.  During stage 2, contractions are generally less painful, because pushing helps relieve contraction pain. Instead of contraction pain, you may feel stretching and burning pain, especially when the widest part of your baby's head passes through the vaginal opening (crowning).  Your health care provider will closely monitor your pushing progress and your baby's progress through the vagina during stage 2.  Your  health care provider may massage the area of skin between your vaginal opening and anus (perineum) or apply warm compresses to your perineum. This helps it stretch as the baby's head starts to crown, which can help prevent perineal tearing. ? In some cases, an incision may be made in your perineum (episiotomy) to allow the baby to pass through the vaginal opening. An episiotomy helps to make the opening of the vagina larger to allow more room for the baby to fit through.  It is very important to breathe and focus so your health care provider can control the delivery of your baby's head. Your health care provider may have you decrease the intensity of your pushing, to help prevent perineal tearing.  After delivery of your baby's head, the shoulders and the rest of the body generally deliver very quickly and without difficulty.  Once your baby is delivered, the umbilical cord may be cut right away, or this may be delayed for 1-2 minutes, depending on your baby's health. This may vary among health care providers, hospitals, and birth centers.  If you and your baby are healthy enough, your baby may be placed on your chest or abdomen to help maintain the baby's temperature and to help you bond with each other. Some mothers and babies start breastfeeding at this time. Your health care team will dry your baby and help keep your baby warm during this time.  Your baby may need immediate care if he or she: ? Showed signs of distress during labor. ? Has a medical condition. ? Was born too early (prematurely). ? Had a bowel movement before birth (meconium). ? Shows signs of difficulty transitioning from being inside the uterus to being outside of the uterus. If you are planning to breastfeed, your health care team will help you begin a feeding. Stage 3  The third stage of labor starts immediately after the birth of your baby and ends after you deliver the placenta. The placenta is an organ that develops  during pregnancy to provide oxygen and nutrients to your baby in the womb.  Delivering the placenta may require some pushing, and you may have mild contractions. Breastfeeding can stimulate contractions to help you deliver the placenta.  After the placenta is delivered, your uterus should tighten (contract) and become firm. This helps to stop bleeding in your uterus. To help your uterus contract and to control bleeding, your health care provider may: ? Give you medicine by injection, through an IV tube, by mouth, or through your rectum (rectally). ? Massage your abdomen or perform a vaginal exam to remove any blood clots that are left in your uterus. ? Empty your bladder by placing a thin, flexible tube (catheter) into your bladder. ? Encourage   you to breastfeed your baby. After labor is over, you and your baby will be monitored closely to ensure that you are both healthy until you are ready to go home. Your health care team will teach you how to care for yourself and your baby. This information is not intended to replace advice given to you by your health care provider. Make sure you discuss any questions you have with your health care provider. Document Released: 09/16/2008 Document Revised: 06/27/2016 Document Reviewed: 12/23/2015 Elsevier Interactive Patient Education  2018 [employer]  

## 2017-11-07 NOTE — Progress Notes (Signed)
ROB-Reports intermittent back pain and pelvic pressure. Discussed home treatment measures. Reviewed red flag symptoms and when to call.RTC x 1 week for ROB with Pattricia BossAnnie or sooner if needed.

## 2017-11-11 ENCOUNTER — Other Ambulatory Visit: Payer: Self-pay

## 2017-11-11 ENCOUNTER — Ambulatory Visit (INDEPENDENT_AMBULATORY_CARE_PROVIDER_SITE_OTHER): Payer: Medicaid Other | Admitting: Certified Nurse Midwife

## 2017-11-11 ENCOUNTER — Encounter: Payer: Self-pay | Admitting: Certified Nurse Midwife

## 2017-11-11 VITALS — BP 106/49 | HR 81 | Wt 142.1 lb

## 2017-11-11 DIAGNOSIS — Z3493 Encounter for supervision of normal pregnancy, unspecified, third trimester: Secondary | ICD-10-CM

## 2017-11-11 LAB — POCT URINALYSIS DIPSTICK
Bilirubin, UA: NEGATIVE
Blood, UA: NEGATIVE
Glucose, UA: NEGATIVE
KETONES UA: NEGATIVE
LEUKOCYTES UA: NEGATIVE
Nitrite, UA: NEGATIVE
Protein, UA: NEGATIVE
Spec Grav, UA: 1.015 (ref 1.010–1.025)
UROBILINOGEN UA: 0.2 U/dL
pH, UA: 6 (ref 5.0–8.0)

## 2017-11-11 NOTE — Patient Instructions (Signed)

## 2017-11-11 NOTE — Progress Notes (Signed)
Rob, doing well. No complaints. Discussed labor precautions and when to go to L&D. She verbalizes understanding.  She endorses good fetal movement. Follow up 1 wk  Doreene BurkeAnnie Sabian Kuba, CNM

## 2017-11-18 ENCOUNTER — Inpatient Hospital Stay
Admission: EM | Admit: 2017-11-18 | Discharge: 2017-11-22 | DRG: 786 | Disposition: A | Discharge status: Home / Self Care | Payer: Medicaid Other | Attending: Obstetrics and Gynecology | Admitting: Obstetrics and Gynecology

## 2017-11-18 ENCOUNTER — Inpatient Hospital Stay: Payer: Medicaid Other | Admitting: Anesthesiology

## 2017-11-18 ENCOUNTER — Inpatient Hospital Stay: Payer: Medicaid Other

## 2017-11-18 ENCOUNTER — Encounter: Payer: Medicaid Other | Admitting: Obstetrics and Gynecology

## 2017-11-18 ENCOUNTER — Other Ambulatory Visit: Payer: Self-pay

## 2017-11-18 ENCOUNTER — Encounter
Admission: EM | Disposition: A | Discharge status: Home / Self Care | Payer: Self-pay | Source: Home / Self Care | Attending: Obstetrics and Gynecology

## 2017-11-18 DIAGNOSIS — D62 Acute posthemorrhagic anemia: Secondary | ICD-10-CM | POA: Diagnosis not present

## 2017-11-18 DIAGNOSIS — O4593 Premature separation of placenta, unspecified, third trimester: Principal | ICD-10-CM | POA: Diagnosis present

## 2017-11-18 DIAGNOSIS — O411230 Chorioamnionitis, third trimester, not applicable or unspecified: Secondary | ICD-10-CM | POA: Diagnosis present

## 2017-11-18 DIAGNOSIS — Z3A39 39 weeks gestation of pregnancy: Secondary | ICD-10-CM

## 2017-11-18 DIAGNOSIS — O9081 Anemia of the puerperium: Secondary | ICD-10-CM | POA: Diagnosis not present

## 2017-11-18 DIAGNOSIS — Z3483 Encounter for supervision of other normal pregnancy, third trimester: Secondary | ICD-10-CM | POA: Diagnosis present

## 2017-11-18 DIAGNOSIS — O4290 Premature rupture of membranes, unspecified as to length of time between rupture and onset of labor, unspecified weeks of gestation: Secondary | ICD-10-CM | POA: Diagnosis present

## 2017-11-18 DIAGNOSIS — T81509A Unspecified complication of foreign body accidentally left in body following unspecified procedure, initial encounter: Secondary | ICD-10-CM

## 2017-11-18 LAB — CHLAMYDIA/NGC RT PCR (ARMC ONLY)
CHLAMYDIA TR: NOT DETECTED
N GONORRHOEAE: NOT DETECTED

## 2017-11-18 LAB — RAPID HIV SCREEN (HIV 1/2 AB+AG)
HIV 1/2 ANTIBODIES: NONREACTIVE
HIV-1 P24 Antigen - HIV24: NONREACTIVE

## 2017-11-18 LAB — CBC
HEMATOCRIT: 33.9 % — AB (ref 35.0–47.0)
HEMOGLOBIN: 11.4 g/dL — AB (ref 12.0–16.0)
MCH: 29.7 pg (ref 26.0–34.0)
MCHC: 33.6 g/dL (ref 32.0–36.0)
MCV: 88.4 fL (ref 80.0–100.0)
Platelets: 186 10*3/uL (ref 150–440)
RBC: 3.84 MIL/uL (ref 3.80–5.20)
RDW: 14.1 % (ref 11.5–14.5)
WBC: 15.4 10*3/uL — AB (ref 3.6–11.0)

## 2017-11-18 SURGERY — Surgical Case
Anesthesia: General

## 2017-11-18 MED ORDER — ROCURONIUM BROMIDE 50 MG/5ML IV SOLN
INTRAVENOUS | Status: AC
  Filled 2017-11-18: qty 1

## 2017-11-18 MED ORDER — SIMETHICONE 80 MG PO CHEW
80.0000 mg | CHEWABLE_TABLET | Freq: Three times a day (TID) | ORAL | Status: DC
  Administered 2017-11-19 – 2017-11-22 (×10): 80 mg via ORAL
  Filled 2017-11-18 (×11): qty 1

## 2017-11-18 MED ORDER — FERROUS SULFATE 325 (65 FE) MG PO TABS
325.0000 mg | ORAL_TABLET | Freq: Two times a day (BID) | ORAL | Status: DC
  Administered 2017-11-19 – 2017-11-22 (×7): 325 mg via ORAL
  Filled 2017-11-18 (×7): qty 1

## 2017-11-18 MED ORDER — MIDAZOLAM HCL 2 MG/2ML IJ SOLN
INTRAMUSCULAR | Status: DC | PRN
  Administered 2017-11-18: 2 mg via INTRAVENOUS

## 2017-11-18 MED ORDER — FENTANYL CITRATE (PF) 100 MCG/2ML IJ SOLN: 50.0000 ug | INTRAMUSCULAR | Status: DC | PRN

## 2017-11-18 MED ORDER — NEOSTIGMINE METHYLSULFATE 10 MG/10ML IV SOLN
INTRAVENOUS | Status: AC
  Filled 2017-11-18: qty 1

## 2017-11-18 MED ORDER — OXYTOCIN 40 UNITS IN LACTATED RINGERS INFUSION - SIMPLE MED
INTRAVENOUS | Status: DC | PRN
  Administered 2017-11-18: 1 mL via INTRAVENOUS
  Administered 2017-11-18: 799 mL via INTRAVENOUS

## 2017-11-18 MED ORDER — COCONUT OIL OIL
1.0000 "application " | TOPICAL_OIL | Status: DC | PRN
  Administered 2017-11-20: 1 via TOPICAL
  Filled 2017-11-18: qty 120

## 2017-11-18 MED ORDER — ONDANSETRON HCL 4 MG/2ML IJ SOLN: 4.0000 mg | Freq: Once | INTRAMUSCULAR | Status: DC | PRN

## 2017-11-18 MED ORDER — ACETAMINOPHEN 325 MG PO TABS: 650.0000 mg | ORAL_TABLET | ORAL | Status: DC | PRN

## 2017-11-18 MED ORDER — LACTATED RINGERS IV SOLN: 500.0000 mL | INTRAVENOUS | Status: DC | PRN

## 2017-11-18 MED ORDER — TERBUTALINE SULFATE 1 MG/ML IJ SOLN
0.2500 mg | Freq: Once | INTRAMUSCULAR | Status: DC | PRN
  Filled 2017-11-18: qty 1

## 2017-11-18 MED ORDER — SUGAMMADEX SODIUM 200 MG/2ML IV SOLN
INTRAVENOUS | Status: AC
  Filled 2017-11-18: qty 2

## 2017-11-18 MED ORDER — METHYLERGONOVINE MALEATE 0.2 MG/ML IJ SOLN
INTRAMUSCULAR | Status: DC | PRN
  Administered 2017-11-18: 0.2 mg via INTRAMUSCULAR

## 2017-11-18 MED ORDER — DIBUCAINE 1 % RE OINT: 1.0000 "application " | TOPICAL_OINTMENT | RECTAL | Status: DC | PRN

## 2017-11-18 MED ORDER — LACTATED RINGERS IV SOLN
INTRAVENOUS | Status: DC
  Administered 2017-11-18: 13:00:00 via INTRAVENOUS

## 2017-11-18 MED ORDER — IBUPROFEN 800 MG PO TABS
800.0000 mg | ORAL_TABLET | Freq: Three times a day (TID) | ORAL | Status: DC
  Administered 2017-11-19 – 2017-11-22 (×11): 800 mg via ORAL
  Filled 2017-11-18 (×11): qty 1

## 2017-11-18 MED ORDER — FENTANYL CITRATE (PF) 100 MCG/2ML IJ SOLN
INTRAMUSCULAR | Status: AC
  Administered 2017-11-18: 25 ug via INTRAVENOUS
  Filled 2017-11-18: qty 2

## 2017-11-18 MED ORDER — NALOXONE HCL 0.4 MG/ML IJ SOLN: 0.4000 mg | INTRAMUSCULAR | Status: DC | PRN

## 2017-11-18 MED ORDER — OXYTOCIN 40 UNITS IN LACTATED RINGERS INFUSION - SIMPLE MED
INTRAVENOUS | Status: AC
  Filled 2017-11-18: qty 1000

## 2017-11-18 MED ORDER — MIDAZOLAM HCL 2 MG/2ML IJ SOLN
INTRAMUSCULAR | Status: AC
  Filled 2017-11-18: qty 2

## 2017-11-18 MED ORDER — METHYLERGONOVINE MALEATE 0.2 MG/ML IJ SOLN
INTRAMUSCULAR | Status: AC
  Administered 2017-11-18: 18:00:00
  Filled 2017-11-18: qty 1

## 2017-11-18 MED ORDER — OXYTOCIN 40 UNITS IN LACTATED RINGERS INFUSION - SIMPLE MED: 2.5000 [IU]/h | INTRAVENOUS | Status: DC

## 2017-11-18 MED ORDER — FENTANYL CITRATE (PF) 100 MCG/2ML IJ SOLN: 25.0000 ug | INTRAMUSCULAR | Status: DC | PRN

## 2017-11-18 MED ORDER — DIPHENHYDRAMINE HCL 25 MG PO CAPS: 25.0000 mg | ORAL_CAPSULE | Freq: Four times a day (QID) | ORAL | Status: DC | PRN

## 2017-11-18 MED ORDER — TETANUS-DIPHTH-ACELL PERTUSSIS 5-2.5-18.5 LF-MCG/0.5 IM SUSP
0.5000 mL | Freq: Once | INTRAMUSCULAR | Status: DC
Start: 2017-11-19 — End: 2017-11-19

## 2017-11-18 MED ORDER — FENTANYL CITRATE (PF) 100 MCG/2ML IJ SOLN
INTRAMUSCULAR | Status: AC
  Filled 2017-11-18: qty 2

## 2017-11-18 MED ORDER — LACTATED RINGERS IV SOLN
Freq: Once | INTRAVENOUS | Status: AC
  Administered 2017-11-18: 17:00:00 via INTRAUTERINE

## 2017-11-18 MED ORDER — SODIUM CHLORIDE 0.9% FLUSH: 9.0000 mL | INTRAVENOUS | Status: DC | PRN

## 2017-11-18 MED ORDER — DIPHENHYDRAMINE HCL 50 MG/ML IJ SOLN: 12.5000 mg | Freq: Four times a day (QID) | INTRAMUSCULAR | Status: DC | PRN

## 2017-11-18 MED ORDER — MENTHOL 3 MG MT LOZG
1.0000 | LOZENGE | OROMUCOSAL | Status: DC | PRN
  Administered 2017-11-20: 3 mg via ORAL
  Filled 2017-11-18 (×5): qty 9

## 2017-11-18 MED ORDER — ONDANSETRON HCL 4 MG/2ML IJ SOLN
4.0000 mg | Freq: Four times a day (QID) | INTRAMUSCULAR | Status: DC | PRN
  Filled 2017-11-18: qty 2

## 2017-11-18 MED ORDER — PROPOFOL 10 MG/ML IV BOLUS
INTRAVENOUS | Status: AC
  Filled 2017-11-18: qty 20

## 2017-11-18 MED ORDER — ONDANSETRON HCL 4 MG/2ML IJ SOLN
INTRAMUSCULAR | Status: DC | PRN
  Administered 2017-11-18: 4 mg via INTRAVENOUS

## 2017-11-18 MED ORDER — SIMETHICONE 80 MG PO CHEW: 80.0000 mg | CHEWABLE_TABLET | ORAL | Status: DC | PRN

## 2017-11-18 MED ORDER — SOD CITRATE-CITRIC ACID 500-334 MG/5ML PO SOLN: 30.0000 mL | ORAL | Status: DC | PRN

## 2017-11-18 MED ORDER — OXYTOCIN BOLUS FROM INFUSION: 500.0000 mL | Freq: Once | INTRAVENOUS | Status: DC

## 2017-11-18 MED ORDER — FENTANYL CITRATE (PF) 100 MCG/2ML IJ SOLN
INTRAMUSCULAR | Status: DC | PRN
  Administered 2017-11-18: 50 ug via INTRAVENOUS
  Administered 2017-11-18: 100 ug via INTRAVENOUS
  Administered 2017-11-18: 50 ug via INTRAVENOUS

## 2017-11-18 MED ORDER — DEXTROSE 5 % IV SOLN
2.0000 g | Freq: Once | INTRAVENOUS | Status: DC
  Filled 2017-11-18: qty 2000

## 2017-11-18 MED ORDER — FENTANYL CITRATE (PF) 100 MCG/2ML IJ SOLN
25.0000 ug | INTRAMUSCULAR | Status: AC | PRN
  Administered 2017-11-18 (×6): 25 ug via INTRAVENOUS

## 2017-11-18 MED ORDER — WITCH HAZEL-GLYCERIN EX PADS: 1.0000 "application " | MEDICATED_PAD | CUTANEOUS | Status: DC | PRN

## 2017-11-18 MED ORDER — PRENATAL MULTIVITAMIN CH
1.0000 | ORAL_TABLET | Freq: Every day | ORAL | Status: DC
  Administered 2017-11-19 – 2017-11-22 (×4): 1 via ORAL
  Filled 2017-11-18 (×5): qty 1

## 2017-11-18 MED ORDER — LIDOCAINE HCL (PF) 1 % IJ SOLN: 30.0000 mL | INTRAMUSCULAR | Status: DC | PRN

## 2017-11-18 MED ORDER — LIDOCAINE 5 % EX PTCH
MEDICATED_PATCH | CUTANEOUS | Status: DC | PRN
  Administered 2017-11-18: 1 via TRANSDERMAL

## 2017-11-18 MED ORDER — PROPOFOL 10 MG/ML IV BOLUS
INTRAVENOUS | Status: DC | PRN
  Administered 2017-11-18: 150 mg via INTRAVENOUS

## 2017-11-18 MED ORDER — SOD CITRATE-CITRIC ACID 500-334 MG/5ML PO SOLN
ORAL | Status: AC
  Filled 2017-11-18: qty 15

## 2017-11-18 MED ORDER — SODIUM CHLORIDE FLUSH 0.9 % IV SOLN
INTRAVENOUS | Status: AC
  Filled 2017-11-18: qty 20

## 2017-11-18 MED ORDER — CEFAZOLIN SODIUM-DEXTROSE 2-4 GM/100ML-% IV SOLN: 2.0000 g | Freq: Once | INTRAVENOUS | Status: DC

## 2017-11-18 MED ORDER — PHENYLEPHRINE HCL 10 MG/ML IJ SOLN
INTRAMUSCULAR | Status: DC | PRN
  Administered 2017-11-18: 100 ug via INTRAVENOUS

## 2017-11-18 MED ORDER — SENNOSIDES-DOCUSATE SODIUM 8.6-50 MG PO TABS
2.0000 | ORAL_TABLET | ORAL | Status: DC
  Administered 2017-11-19 – 2017-11-20 (×3): 2 via ORAL
  Filled 2017-11-18 (×3): qty 2

## 2017-11-18 MED ORDER — FENTANYL CITRATE (PF) 100 MCG/2ML IJ SOLN
25.0000 ug | INTRAMUSCULAR | Status: AC | PRN
  Administered 2017-11-18 (×2): 25 ug via INTRAVENOUS

## 2017-11-18 MED ORDER — MORPHINE SULFATE 2 MG/ML IV SOLN
INTRAVENOUS | Status: DC
  Administered 2017-11-18: 3 mg via INTRAVENOUS
  Administered 2017-11-19: 18 mg via INTRAVENOUS
  Administered 2017-11-19: 12 mg via INTRAVENOUS
  Filled 2017-11-18 (×3): qty 30

## 2017-11-18 MED ORDER — OXYTOCIN 40 UNITS IN LACTATED RINGERS INFUSION - SIMPLE MED
2.5000 [IU]/h | INTRAVENOUS | Status: AC
  Filled 2017-11-18: qty 1000

## 2017-11-18 MED ORDER — MEASLES, MUMPS & RUBELLA VAC ~~LOC~~ INJ
0.5000 mL | INJECTION | Freq: Once | SUBCUTANEOUS | Status: DC
  Filled 2017-11-18: qty 0.5

## 2017-11-18 MED ORDER — SIMETHICONE 80 MG PO CHEW
80.0000 mg | CHEWABLE_TABLET | ORAL | Status: DC
  Administered 2017-11-19 – 2017-11-21 (×4): 80 mg via ORAL
  Filled 2017-11-18 (×4): qty 1

## 2017-11-18 MED ORDER — LACTATED RINGERS IV SOLN
INTRAVENOUS | Status: DC
  Administered 2017-11-19 – 2017-11-20 (×5): via INTRAVENOUS

## 2017-11-18 MED ORDER — SUGAMMADEX SODIUM 200 MG/2ML IV SOLN
INTRAVENOUS | Status: DC | PRN
  Administered 2017-11-18: 130 mg via INTRAVENOUS

## 2017-11-18 MED ORDER — OXYTOCIN 40 UNITS IN LACTATED RINGERS INFUSION - SIMPLE MED: 1.0000 m[IU]/min | INTRAVENOUS | Status: DC

## 2017-11-18 MED ORDER — ONDANSETRON HCL 4 MG/2ML IJ SOLN: 4.0000 mg | Freq: Four times a day (QID) | INTRAMUSCULAR | Status: DC | PRN

## 2017-11-18 MED ORDER — SUCCINYLCHOLINE CHLORIDE 20 MG/ML IJ SOLN
INTRAMUSCULAR | Status: DC | PRN
  Administered 2017-11-18: 100 mg via INTRAVENOUS

## 2017-11-18 MED ORDER — SUCCINYLCHOLINE CHLORIDE 20 MG/ML IJ SOLN
INTRAMUSCULAR | Status: AC
  Filled 2017-11-18: qty 1

## 2017-11-18 MED ORDER — DIPHENHYDRAMINE HCL 12.5 MG/5ML PO ELIX
12.5000 mg | ORAL_SOLUTION | Freq: Four times a day (QID) | ORAL | Status: DC | PRN
  Filled 2017-11-18: qty 5

## 2017-11-18 MED ORDER — ROCURONIUM BROMIDE 100 MG/10ML IV SOLN
INTRAVENOUS | Status: DC | PRN
  Administered 2017-11-18: 10 mg via INTRAVENOUS

## 2017-11-18 MED ORDER — ACETAMINOPHEN 325 MG PO TABS
650.0000 mg | ORAL_TABLET | ORAL | Status: DC | PRN
  Administered 2017-11-19 – 2017-11-20 (×3): 650 mg via ORAL
  Filled 2017-11-18 (×3): qty 2

## 2017-11-18 SURGICAL SUPPLY — 21 items
CANISTER SUCT 3000ML PPV (MISCELLANEOUS) ×3 IMPLANT
CHLORAPREP W/TINT 26ML (MISCELLANEOUS) ×3 IMPLANT
DRSG TELFA 3X8 NADH (GAUZE/BANDAGES/DRESSINGS) ×3 IMPLANT
ELECT REM PT RETURN 9FT ADLT (ELECTROSURGICAL) ×3
ELECTRODE REM PT RTRN 9FT ADLT (ELECTROSURGICAL) ×1 IMPLANT
GAUZE SPONGE 4X4 12PLY STRL (GAUZE/BANDAGES/DRESSINGS) ×3 IMPLANT
GLOVE BIO SURGEON STRL SZ8 (GLOVE) ×3 IMPLANT
GOWN STRL REUS W/ TWL LRG LVL3 (GOWN DISPOSABLE) ×2 IMPLANT
GOWN STRL REUS W/ TWL XL LVL3 (GOWN DISPOSABLE) ×1 IMPLANT
GOWN STRL REUS W/TWL LRG LVL3 (GOWN DISPOSABLE) ×4
GOWN STRL REUS W/TWL XL LVL3 (GOWN DISPOSABLE) ×2
NS IRRIG 1000ML POUR BTL (IV SOLUTION) ×3 IMPLANT
PACK C SECTION AR (MISCELLANEOUS) ×3 IMPLANT
PAD OB MATERNITY 4.3X12.25 (PERSONAL CARE ITEMS) ×3 IMPLANT
PAD PREP 24X41 OB/GYN DISP (PERSONAL CARE ITEMS) ×3 IMPLANT
STRAP SAFETY BODY (MISCELLANEOUS) ×3 IMPLANT
SUT CHROMIC 1-0 (SUTURE) ×15 IMPLANT
SUT MAXON ABS #0 GS21 30IN (SUTURE) ×6 IMPLANT
SUT PLAIN GUT 0 (SUTURE) ×6 IMPLANT
SUT VIC AB 2-0 CT1 27 (SUTURE) ×4
SUT VIC AB 2-0 CT1 TAPERPNT 27 (SUTURE) ×2 IMPLANT

## 2017-11-18 NOTE — H&P (Signed)
Obstetric History and Physical  Tammy Ward is a 21 y.o. G1P0000 with IUP at 574w2d presenting with SROM today. Patient states she has been having  irregular, every 5-9 minutes contractions, none vaginal bleeding, ruptured, meconium light membranes, with active fetal movement.    [facility]  Pregnancy complications or risks:anemia  Prenatal labs and studies: ABO, Rh: O/Positive/-- (04/23 1042) Antibody: Negative (04/23 1042) Rubella: 4.01 (04/23 1042) RPR: Non Reactive (04/23 1042)  HBsAg: Negative (04/23 1042)  HIV:   negative WUJ:WJXBJYNWGBS:Negative (11/09 1607) 1 hr Glucola  normal Genetic screening normal Anatomy US normal  Past Medical History:  Diagnosis Date  . Kidney stones    kidney stones, cysts  . Left genital labial abscess     Past Surgical History:  Procedure Laterality Date  . ABSCESS DRAINAGE  02-02-15   Labial   . KIDNEY STONE SURGERY  2014    OB History  Gravida Para Term Preterm AB Living  1 0 0 0 0 0  SAB TAB Ectopic Multiple Live Births  0 0 0 0      # Outcome Date GA Lbr Len/2nd Weight Sex Delivery Anes PTL Lv  1 Current             Obstetric Comments  Menstrual age: [age]    Age 1st Pregnancy:NA    Social History   Socioeconomic History  . Marital status: Single    Spouse name: None  . Number of children: None  . Years of education: None  . Highest education level: None  Social Needs  . Financial resource strain: None  . Food insecurity - worry: None  . Food insecurity - inability: None  . Transportation needs - medical: None  . Transportation needs - non-medical: None  Occupational History  . None  Tobacco Use  . Smoking status: Never Smoker  . Smokeless tobacco: Never Used  Substance and Sexual Activity  . Alcohol use: No    Alcohol/week: 0.0 oz  . Drug use: No  . Sexual activity: Yes    Birth control/protection: None  Other Topics Concern  . None  Social History Narrative  . None    Family  History  Problem Relation Age of Onset  . Asthma Sister   . Thyroid disease Sister   . Cancer Neg Hx   . Diabetes Neg Hx   . Heart disease Neg Hx     Medications Prior to Admission  Medication Sig Dispense Refill Last Dose  . Prenatal Vit-Fe Fumarate-FA (PRENATAL VITAMINS) 28-0.8 MG TABS Take 1 tablet by mouth daily. 30 tablet 11 Past Week at Unknown time    No Known Allergies  Review of Systems: Negative except for what is mentioned in HPI.  Physical Exam: BP 134/80 (BP Location: Right Arm)   Pulse 86   Resp 18   LMP 02/16/2017 (Exact Date)  GENERAL: Well-developed, well-nourished female in no acute distress.  LUNGS: Clear to auscultation bilaterally.  HEART: Regular rate and rhythm. ABDOMEN: Soft, nontender, nondistended, gravid. EXTREMITIES: Nontender, no edema, 2+ distal pulses. Cervical Exam: Dilation: 1 Effacement (%): 80 Station: -3 Exam by:: R Dannielle HuhFairley FHT:  Baseline rate 144 bpm   Variability moderate  Accelerations present   Decelerations none Contractions: Every 2-4 mins   Pertinent Labs/Studies:   No results found for this or any previous visit (from the past 24 hour(s)).  Assessment : Tammy Ward is a 21 y.o. G1P0000 at [redacted]w[redacted]d being admitted for labor with SROM  Plan: Labor: Expectant management.  Induction/Augmentation as needed, per protocol FWB: Reassuring fetal heart tracing.  GBS negative Delivery plan: Hopeful for vaginal delivery  [name], CNM [facility]

## 2017-11-18 NOTE — Progress Notes (Signed)
Spoke with Tammy MaresMelody Shambley, CNM about vaginal exam (1/80/-3) and membrane status (leaking light meconium). Order to admit patient, remove monitors and have pt walk to encourage labor.

## 2017-11-18 NOTE — Anesthesia Procedure Notes (Signed)
Procedure Name: Intubation Date/Time: 11/18/2017 5:59 PM Performed by: Lendon Colonel, CRNA Pre-anesthesia Checklist: Patient identified, Patient being monitored, Timeout performed, Emergency Drugs available and Suction available Patient Re-evaluated:Patient Re-evaluated prior to induction Oxygen Delivery Method: Circle system utilized Preoxygenation: Pre-oxygenation with 100% oxygen Induction Type: IV induction Laryngoscope Size: Mac and 3 Grade View: Grade I Tube type: Oral Tube size: 7.0 mm Number of attempts: 1 Airway Equipment and Method: Stylet Placement Confirmation: ETT inserted through vocal cords under direct vision,  positive ETCO2 and breath sounds checked- equal and bilateral Secured at: 21 cm Tube secured with: Tape Dental Injury: Teeth and Oropharynx as per pre-operative assessment

## 2017-11-18 NOTE — Anesthesia Preprocedure Evaluation (Addendum)
Anesthesia Evaluation  Patient identified by MRN, date of birth, ID band Patient awake    Reviewed: Allergy & Precautions, NPO status , Patient's Chart, lab work & pertinent test results  Airway Mallampati: II  TM Distance: >3 FB     Dental no notable dental hx.    Pulmonary neg pulmonary ROS,    Pulmonary exam normal        Cardiovascular negative cardio ROS Normal cardiovascular exam     Neuro/Psych negative neurological ROS  negative psych ROS   GI/Hepatic negative GI ROS, Neg liver ROS,   Endo/Other  negative endocrine ROS  Renal/GU   Female GU complaint     Musculoskeletal   Abdominal Normal abdominal exam  (+)   Peds negative pediatric ROS (+)  Hematology negative hematology ROS (+)   Anesthesia Other Findings   Reproductive/Obstetrics (+) Pregnancy                             Anesthesia Physical Anesthesia Plan  ASA: II and emergent  Anesthesia Plan: General   Post-op Pain Management:    Induction: Intravenous, Rapid sequence and Cricoid pressure planned  PONV Risk Score and Plan:   Airway Management Planned: Oral ETT  Additional Equipment:   Intra-op Plan:   Post-operative Plan: Extubation in OR  Informed Consent: I have reviewed the patients History and Physical, chart, labs and discussed the procedure including the risks, benefits and alternatives for the proposed anesthesia with the patient or authorized representative who has indicated his/her understanding and acceptance.   Dental advisory given  Plan Discussed with: CRNA and Surgeon  Anesthesia Plan Comments:         Anesthesia Quick Evaluation

## 2017-11-18 NOTE — Transfer of Care (Signed)
Immediate Anesthesia Transfer of Care Note  Patient: Tammy BirkenheadElizabeth K Daley  Procedure(s) Performed: csection  Patient Location: PACU  Anesthesia Type:General  Level of Consciousness: awake, alert , oriented and patient cooperative  Airway & Oxygen Therapy: Patient Spontanous Breathing and Patient connected to face mask oxygen  Post-op Assessment: Report given to RN and Post -op Vital signs reviewed and stable  Post vital signs: Reviewed and stable  Last Vitals:  Vitals:   11/18/17 1700 11/18/17 1911  BP: (!) 108/59 120/76  Pulse: (!) 107 (!) 103  Resp: 16 14  Temp:  (!) 36.2 C  SpO2:  100%    Last Pain:  Vitals:   11/18/17 1700  TempSrc:   PainSc: 1       Patients Stated Pain Goal: 0 (11/18/17 1700)  Complications: No apparent anesthesia complications

## 2017-11-18 NOTE — Progress Notes (Signed)
Nitrazine positive. Pt reports last sexual intercourse 3 weeks ago.

## 2017-11-18 NOTE — OB Triage Note (Signed)
39w, 2d G1/P0. Presents from home reporting possible rupture of membranes around 1000. Pad present in underwear filled with light brown fluid, pt reports fluid leaking constantly since 1000. Denies pain. Reports decreased fetal movement since last night. FHT 135. Monitors applied/assessing \

## 2017-11-18 NOTE — Progress Notes (Signed)
Tammy Ward is a 21 y.o. G1P1001 at 5771w2d by LMP admitted for rupture of membranes  Subjective: No pain until IUPC and FSE attempted placement at which time she reported cervical pain and lower back pain with exam.  Objective: BP (!) 108/59 (BP Location: Left Arm)   Pulse (!) 107   Temp 98.9 F (37.2 C) (Oral)   Resp 16   Ht 4\' 11"  (1.499 m)   Wt 142 lb (64.4 kg)   LMP 02/16/2017 (Exact Date)   Breastfeeding? Unknown   BMI 28.68 kg/m  No intake/output data recorded. No intake/output data recorded.  FHT:  FHR: 98 bpm, variability: minimal ,  accelerations:  Present,  decelerations:  Present variable spontaneous decels down to 90s that lasted for 24 minutes with occasional recovery with position changes and O2. I placed a FSE and IUPC with no resolution of decel.  UC:   regular, every 2-3 minutes until 1720 at which time patient reported severe lower back pain and had prolonged tetonic contraction x 4 minutes SVE:   Dilation: 3 Effacement (%): 80 Station: -3 Exam by:: M.Maple Odaniel, CNM  Labs: Lab Results  Component Value Date   WBC 15.4 (H) 11/18/2017   HGB 11.4 (L) 11/18/2017   HCT 33.9 (L) 11/18/2017   MCV 88.4 11/18/2017   PLT 186 11/18/2017    Assessment / Plan: fetal distress and suspected uterine abruption, MAD on unit and patient prepped for OR, as she consents to cesarean delivery of infant. Please see op note  Labor: NA Preeclampsia:  labs stable Fetal Wellbeing:  Category III Pain Control:  Labor support without medications I/D:  n/a Anticipated MOD:  c/s  Mykal Kirchman N Amirah Goerke 11/18/2017, 7:09 PM

## 2017-11-18 NOTE — Anesthesia Post-op Follow-up Note (Signed)
Anesthesia QCDR form completed.        

## 2017-11-19 ENCOUNTER — Encounter: Payer: Self-pay | Admitting: Obstetrics and Gynecology

## 2017-11-19 DIAGNOSIS — O4593 Premature separation of placenta, unspecified, third trimester: Principal | ICD-10-CM

## 2017-11-19 DIAGNOSIS — Z3A39 39 weeks gestation of pregnancy: Secondary | ICD-10-CM

## 2017-11-19 LAB — CBC
HEMATOCRIT: 25.1 % — AB (ref 35.0–47.0)
HEMOGLOBIN: 8.4 g/dL — AB (ref 12.0–16.0)
MCH: 29.7 pg (ref 26.0–34.0)
MCHC: 33.4 g/dL (ref 32.0–36.0)
MCV: 89 fL (ref 80.0–100.0)
Platelets: 157 10*3/uL (ref 150–440)
RBC: 2.82 MIL/uL — ABNORMAL LOW (ref 3.80–5.20)
RDW: 14.3 % (ref 11.5–14.5)
WBC: 13.5 10*3/uL — ABNORMAL HIGH (ref 3.6–11.0)

## 2017-11-19 LAB — RPR: RPR: NONREACTIVE

## 2017-11-19 MED ORDER — HYDROCODONE-ACETAMINOPHEN 5-325 MG PO TABS
1.0000 | ORAL_TABLET | ORAL | Status: DC | PRN
  Administered 2017-11-19 (×2): 1 via ORAL
  Administered 2017-11-19 – 2017-11-20 (×2): 2 via ORAL
  Administered 2017-11-20 (×2): 1 via ORAL
  Filled 2017-11-19: qty 1
  Filled 2017-11-19 (×2): qty 2
  Filled 2017-11-19: qty 1
  Filled 2017-11-19: qty 2
  Filled 2017-11-19 (×2): qty 1

## 2017-11-19 MED ORDER — GENTAMICIN SULFATE 40 MG/ML IJ SOLN
INTRAVENOUS | Status: AC
  Administered 2017-11-19: 12:00:00 via INTRAVENOUS
  Filled 2017-11-19: qty 2.5

## 2017-11-19 NOTE — Progress Notes (Signed)
Performed peri-care on patient and helped her dangle on the side of the bed. Stood patient up and let her walk to [place], NT (3 feet in front of her) slowly. Patient stated her legs felt a little tingly when she first stood up but then went away. After walking 3 feet patient began to look very flushed and stated she started to feel dizzy. We walked with her slowly back to bed. Talked with her about trying again in a couple of hours. She liked the plan, stated she felt "better" after getting up to walk. Kept foley catheter in at this time.

## 2017-11-19 NOTE — Progress Notes (Signed)
Tammy SeniorNotify Emily, [place]

## 2017-11-19 NOTE — Progress Notes (Signed)
Notify Irving BurtonEmily, Rn of HR

## 2017-11-19 NOTE — Progress Notes (Signed)
Jeannett SeniorNotify Emily, RN

## 2017-11-19 NOTE — Anesthesia Postprocedure Evaluation (Signed)
Anesthesia Post Note  Patient: Tammy Ward  Procedure(s) Performed: csection  Patient location during evaluation: PACU Anesthesia Type: General Level of consciousness: awake and alert and oriented Pain management: pain level controlled Vital Signs Assessment: post-procedure vital signs reviewed and stable Respiratory status: spontaneous breathing Cardiovascular status: blood pressure returned to baseline Anesthetic complications: no     Last Vitals:  Vitals:   11/18/17 2354 11/19/17 0010  BP: (!) 118/56   Pulse: (!) 112   Resp: 20 15  Temp: (!) 38.1 C   SpO2: 99% 95%    Last Pain:  Vitals:   11/19/17 0010  TempSrc:   PainSc: 4                  Jaedyn Lard

## 2017-11-19 NOTE — Progress Notes (Signed)
Subjective: Post Op Day 1 Day Post-Op: Cesarean Delivery Patient reports incisional pain, tolerating PO, + flatus and Breastfeeding.    Objective: Vital signs in last 24 hours: Temp:  [97.1 F (36.2 C)-102.5 F (39.2 C)] 102.5 F (39.2 C) (11/29 0804) Pulse Rate:  [86-132] 132 (11/29 0804) Resp:  [14-20] 20 (11/29 0804) BP: (96-134)/(54-91) 118/78 (11/29 0804) SpO2:  [95 %-100 %] 100 % (11/29 0804) FiO2 (%):  [95 %-97 %] 97 % (11/29 0551) Weight:  [142 lb (64.4 kg)] 142 lb (64.4 kg) (11/28 1334)  Physical Exam:  General: alert, cooperative, appears stated age and fatigued Lochia: appropriate Uterine Fundus: firm Incision: dressing clean and dry and intact DVT Evaluation: No evidence of DVT seen on physical exam. Negative Homan's sign.  Recent Labs    11/18/17 1315 11/19/17 0627  HGB 11.4* 8.4*  HCT 33.9* 25.1*     Assessment/Plan: Status post Cesarean section. Doing well postoperatively. discussed fever and po pain meds. Will continue to watch temperature. Continue current care.  Melody N Shambley 11/19/2017, 8:36 AM

## 2017-11-19 NOTE — Addendum Note (Signed)
Addendum  created 11/19/17 0152 by Yves Dillarroll, Korianna Washer, MD   Intraprocedure Event edited, Sign clinical note

## 2017-11-19 NOTE — Progress Notes (Signed)
Notify Emily, RN 

## 2017-11-19 NOTE — Op Note (Signed)
OPERATIVE NOTE:  Tammy Daweslizabeth K Caldera PROCEDURE DATE: 11/19/2017   PREOPERATIVE DIAGNOSIS: 1.  Term intrauterine pregnancy, undelivered 2.  Meconium staining amniotic fluid 3.  Fetal distress POSTOPERATIVE DIAGNOSIS: 1.  Term intrauterine pregnancy, delivered 2.  Viable female infant 3.  Meconium staining amniotic fluid 4.  Fetal distress 5.  Uterine rupture  PROCEDURE: Emergency low transverse cesarean section SURGEON:  Herold HarmsMartin A Federico Maiorino, MD ASSISTANTS: Yolanda BonineMelody Burr, CNM ANESTHESIA: General INDICATIONS: 21 y.o. G1P1001, presented in labor; patient developed a pattern of hyper stimulation with contractions every 1 minute and subsequent development of severe maternal back pain with onset of fetal bradycardia; AROM revealed meconium staining amniotic fluid.  Continued fetal bradycardia prompted emergency low-transverse cesarean section delivery.  FINDINGS: Viable female infant; meconium staining amniotic fluid; the fetal head was noted to be out of the pelvis following lower uterine segment transverse incision and there was umbilical cord below the fetal head; lower uterine segment was notable for a vertical disruption extending towards the cervix approximately 5 cm in length from the low transverse incision (no significant pelvic manipulation was needed to deliver the fetal head which was outside the pelvis at delivery.)   I/O's: Total I/O In: -  Out: 900 [Urine:900] COUNTS:  NO Stat C-section precluded instrument count/lap count and ACTION TAKEN: X-RAY(S) TAKEN Yes SPECIMENS: Umbilical artery cord pH; placenta sent to pathology ANTIBIOTIC PROPHYLAXIS:Ancef 2 grams COMPLICATIONS: None immediate  PROCEDURE IN DETAIL: Following decision for emergency cesarean section, and after completing verbal counseling, the patient was wheeled to the operating room for emergent delivery.  She was given 2 g of Ancef antibiotic prophylaxis.  A Foley catheter was placed.  Patient was placed on the  operating table With a right lateral hip roll in place.  ChloraPrep abdominal/perineal prep and drape was performed in an emergent manner.  Rapid sequence general endotracheal anesthesia was induced without difficulty.  A Pfannenstiel incision was made into the abdomen with incising to the fascia.  The fascial extension was carried through with Mayo's transversely.  The midline raphae was incised sharply and the peritoneal cavity was entered.  Bladder flap was placed.  Low transverse incision was made in the uterus and it was extended in a cephalad and caudad manner.  The fetal head was noted to be outside the pelvis and the umbilical cord was sitting below the fetal head.  Infant was delivered in a vertex presentation the umbilical cord was doubly clamped and cut and the infant was handed off to the resuscitation team.  A segment of cord was clamped and cut and handed off for umbilical artery cord pH assessment.  The placenta was manually extracted from the uterus and sent to pathology.  The uterus was externalized onto the anterior abdominal wall was cleared of all debris with laps.  Inspection of the uterus tubes and ovaries was normal with the exception of a 5 cm vertical disruption in the lower uterine segment which extended from the low transverse incision in the uterus. The vertical lower uterine segment disruption was closed in 2 layers 1.  The first layer was a running locking stitch of #1 chromic.  The second layer was an imbricating layer.  Following closure of this vertical disruption, the low transverse incision was closed in 2 layers.  The first layer was a running locking stitch.  The second layer involved 2 figure-of-eight overlapping sutures using #1 chromic.  Good hemostasis was noted.  The uterus and adnexa were placed back into the abdominal pelvic cavity.  The gutters were cleared of all debris with laps.  The incision was closed in layers with 0 Maxon being used on the fascia in a simple  running manner.  The subcutaneous tissues were span and not reapproximated.  The skin was closed with staples.  A pressure dressing was applied along with a Lidoderm patch for analgesia.  The patient was awakened and extubated following verification of no laps or instruments being left within the abdomen by portable x-ray.  She was sent to the recovery room for observation.  No complications were encountered.  Procedure was well-tolerated.  Due to the unusual nature of the fetal heart rate abnormality findings and the clinical scenario at the time of delivery (initial suspicion was for abruption; clinical findings were consistent with a uterine rupture), I do not recommend that this patient have a trial of labor after cesarean in future pregnancies.  Atina Feeley A. Beatris Sie Francesco, MD, ACOG [facility]

## 2017-11-19 NOTE — Progress Notes (Signed)
2.4 mL/4.8 mg of morphine wasted from the PCA pump with Isaias Sakaiiffany Wilson, RN. Yellow form filled out and tubed down to pharmacy.

## 2017-11-20 LAB — URINE CULTURE
CULTURE: NO GROWTH
SPECIAL REQUESTS: NORMAL

## 2017-11-20 LAB — SURGICAL PATHOLOGY

## 2017-11-20 MED ORDER — GENTAMICIN SULFATE 40 MG/ML IJ SOLN
Freq: Three times a day (TID) | INTRAVENOUS | Status: DC
  Administered 2017-11-20 – 2017-11-22 (×5): via INTRAVENOUS
  Filled 2017-11-20 (×6): qty 2.5
  Filled 2017-11-20: qty 2

## 2017-11-20 MED ORDER — LIDOCAINE 5 % EX PTCH
1.0000 | MEDICATED_PATCH | CUTANEOUS | Status: DC
  Administered 2017-11-20 – 2017-11-21 (×2): 1 via TRANSDERMAL
  Filled 2017-11-20 (×2): qty 1

## 2017-11-20 MED ORDER — AMMONIA AROMATIC IN INHA
RESPIRATORY_TRACT | Status: AC
  Filled 2017-11-20: qty 10

## 2017-11-20 NOTE — Progress Notes (Signed)
Pharmacist - Prescriber Communication  Lab called to inform me that the gentamicin trough scheduled 11/20/17 at 17:30 and the peak scheduled 11/20/17 at 19:00 were both missed. Dose was given at 18:21 - unable to draw levels now. I retimed the trough for 11/21/17 at 01:30 and the peak for 11/21/17 at 03:00, to be drawn around the next dose.  Skylee Baird A. [place], [place]arm[place]D., BCPS Clinical Pharmacist 11/20/2017 20:27

## 2017-11-20 NOTE — Progress Notes (Signed)
Subjective: Post Op Day 2 Days Post-Op: Cesarean Delivery Patient reports incisional pain, tolerating PO, + flatus and Breastfeeding.    Objective: Vital signs in last 24 hours: Temp:  [97.5 F (36.4 C)-102.5 F (39.2 C)] 98.4 F (36.9 C) (11/30 0725) Pulse Rate:  [87-139] 114 (11/30 0725) Resp:  [16-22] 16 (11/30 0511) BP: (98-118)/(54-78) 111/66 (11/30 0725) SpO2:  [96 %-100 %] 100 % (11/30 0725)  Physical Exam:  General: alert, cooperative, appears stated age, fatigued and pale Lochia: appropriate Uterine Fundus: firm Incision: healing well, no significant drainage DVT Evaluation: No evidence of DVT seen on physical exam. Negative Homan's sign.  Recent Labs    11/18/17 1315 11/19/17 0627  HGB 11.4* 8.4*  HCT 33.9* 25.1*     Assessment/Plan: Status post Cesarean section. Doing well postoperatively.  Fever of unknown etiology. Difficulty with first ambulation attempt due to dizziness. Will consider transfusion if continues. Patient aware and consents. Still awaiting blood and urine culture results. Last elevated temp 4 hours ago 100.4. Continue current care.  Melody N Shambley 11/20/2017, 7:41 AM

## 2017-11-21 LAB — CBC WITH DIFFERENTIAL/PLATELET
Basophils Absolute: 0 10*3/uL (ref 0–0.1)
Basophils Relative: 0 %
Eosinophils Absolute: 0.2 10*3/uL (ref 0–0.7)
Eosinophils Relative: 2 %
HCT: 19.3 % — ABNORMAL LOW (ref 35.0–47.0)
HEMOGLOBIN: 6.5 g/dL — AB (ref 12.0–16.0)
LYMPHS ABS: 0.9 10*3/uL — AB (ref 1.0–3.6)
LYMPHS PCT: 7 %
MCH: 29.8 pg (ref 26.0–34.0)
MCHC: 33.5 g/dL (ref 32.0–36.0)
MCV: 89 fL (ref 80.0–100.0)
MONOS PCT: 3 %
Monocytes Absolute: 0.4 10*3/uL (ref 0.2–0.9)
NEUTROS PCT: 88 %
Neutro Abs: 11.5 10*3/uL — ABNORMAL HIGH (ref 1.4–6.5)
Platelets: 173 10*3/uL (ref 150–440)
RBC: 2.17 MIL/uL — AB (ref 3.80–5.20)
RDW: 14.2 % (ref 11.5–14.5)
WBC: 13.1 10*3/uL — AB (ref 3.6–11.0)

## 2017-11-21 LAB — ABO/RH: ABO/RH(D): O POS

## 2017-11-21 LAB — HEMOGLOBIN AND HEMATOCRIT, BLOOD
HCT: 33.1 % — ABNORMAL LOW (ref 35.0–47.0)
Hemoglobin: 11.3 g/dL — ABNORMAL LOW (ref 12.0–16.0)

## 2017-11-21 LAB — GENTAMICIN LEVEL, TROUGH: Gentamicin Trough: <0.5 ug/mL — ABNORMAL LOW (ref 0.5–2.0)

## 2017-11-21 LAB — PREPARE RBC (CROSSMATCH)

## 2017-11-21 LAB — GENTAMICIN LEVEL, PEAK: Gentamicin Pk: 2.1 ug/mL — ABNORMAL LOW (ref 5.0–10.0)

## 2017-11-21 MED ORDER — SODIUM CHLORIDE 0.9 % IV SOLN
Freq: Once | INTRAVENOUS | Status: AC
  Administered 2017-11-21: 07:00:00 via INTRAVENOUS

## 2017-11-21 NOTE — Progress Notes (Signed)
H&H results called to M. Shambley CNM as per order. No new orders received.

## 2017-11-21 NOTE — Plan of Care (Signed)
Alert and oriented with cheerful affect. Color good, skin w&d. S/P 2 Units of PRBC today with increase in H&H. Pt. Is up ambulating with steady gait. VSS. Demonstrating aprop. Care for herself and Infant. Lower abd. Incision with staples intact and no s/s complications. States pain control with scheduled Ibuprofen and Lidoderm patch. Is voiding without difficulty and states BM today X2. IS breast feeding with assist. Using nipple shield. Will cont. To follow closely and support this new Mom.

## 2017-11-21 NOTE — Progress Notes (Signed)
Postop day 3: Patient appears to be doing much better clinically now that antibiotics have been restarted with gentamicin and clindamycin being given every 8 hours. Blood transfusion also has improved patient's perfusion and clinical condition . Pathology report from the placenta demonstrated chorioamnionitis, acute, as well as funisitis of the umbilical cord and evidence of meconium.  Certainly the intrauterine infection could have contributed to the patient's postop infection as well as morbidity from the suspected uterine rupture.  Surgical findings were reviewed with the family and the patient is recommended NOT to [facility] in future pregnancies because of the uterine rupture.  Herold HarmsMartin A Xandrea Clarey, MD

## 2017-11-21 NOTE — Progress Notes (Signed)
Subjective: Post Op Day 3 Days Post-Op: Cesarean Delivery Patient reports incisional pain, tolerating PO, + flatus, + BM, no problems voiding, ambulating and Breastfeeding.    Objective: Vital signs in last 24 hours: Temp:  [98.2 F (36.8 C)-100.4 F (38 C)] 98.6 F (37 C) (12/01 1350) Pulse Rate:  [94-128] 94 (12/01 1350) Resp:  [16-20] 17 (12/01 1350) BP: (102-132)/(54-82) 120/78 (12/01 1350) SpO2:  [97 %-99 %] 99 % (12/01 1350)  Physical Exam:  General: alert, cooperative, appears stated age and fatigued Lochia: appropriate Uterine Fundus: firm Incision: intact and healing well DVT Evaluation: No evidence of DVT seen on physical exam. Negative Homan's sign.  Recent Labs    11/19/17 0627 11/21/17 0345  HGB 8.4* 6.5*  HCT 25.1* 19.3*     Assessment/Plan: Status post Cesarean section. Doing well postoperatively. 2 units PRBC infused today and will monitor overnight with anticipated discharge in am  Continue current care.  Melody N Shambley 11/21/2017, 2:19 PM

## 2017-11-22 LAB — TYPE AND SCREEN
ABO/RH(D): O POS
ANTIBODY SCREEN: NEGATIVE
Unit division: 0
Unit division: 0

## 2017-11-22 LAB — BPAM RBC
BLOOD PRODUCT EXPIRATION DATE: 201812122359
BLOOD PRODUCT EXPIRATION DATE: 201812122359
ISSUE DATE / TIME: 201812011320
ISSUE DATE / TIME: 201812010836
UNIT TYPE AND RH: 5100
Unit Type and Rh: 5100

## 2017-11-22 MED ORDER — VITAMIN D3 125 MCG (5000 UT) PO CAPS: 1.0000 | ORAL_CAPSULE | Freq: Every day | ORAL | 2 refills | Status: DC

## 2017-11-22 MED ORDER — HYDROCODONE-ACETAMINOPHEN 5-325 MG PO TABS: 1.0000 | ORAL_TABLET | ORAL | 0 refills | Status: DC | PRN

## 2017-11-22 MED ORDER — DOCUSATE SODIUM 100 MG PO CAPS: 100.0000 mg | ORAL_CAPSULE | Freq: Two times a day (BID) | ORAL | 2 refills | Status: AC

## 2017-11-22 MED ORDER — NORETHINDRONE 0.35 MG PO TABS: 1.0000 | ORAL_TABLET | Freq: Every day | ORAL | 11 refills | Status: DC

## 2017-11-22 MED ORDER — IBUPROFEN 800 MG PO TABS: 800.0000 mg | ORAL_TABLET | Freq: Three times a day (TID) | ORAL | 0 refills | Status: DC

## 2017-11-22 NOTE — Discharge Summary (Signed)
Physician Obstetric Discharge Summary  Patient ID: Tammy Ward MRN: [id number] DOB/AGE: [birth year] 21 y.o.  Reason for Admission: rupture of membranes Prenatal Procedures: NST and ultrasound Intrapartum Procedures: cesarean: low cervical, transverse Postpartum Procedures: transfusion 2 units PRBC and antibiotics Complications-Operative and Postpartum: anemia and fever of unknown etiology; Uterine Rupture; Chorioamnionitis  Delivery Note At 5:54 PM a viable and healthy female was delivered via C-Section, Low Transverse.  APGAR: 7, 9; weight 7 lb 5.8 oz (3340 g)  .    H/H:  Lab Results  Component Value Date/Time   HGB 11.3 (L) 11/21/2017 06:33 PM   HGB 10.6 (L) 08/28/2017 08:55 AM   HCT 33.1 (L) 11/21/2017 06:33 PM   HCT 31.1 (L) 08/28/2017 08:55 AM    Brief Hospital Course: Tammy Ward is a G1P1001 who underwent cesarean section on 11/18/2017.  Patient had an uncomplicated surgery; however, (LUS 6 cm midline rupture below the uterine incision was repaired in 2 layers); for further details of this surgery, please refer to the operative note.  Patient had a post partum fever treated with antibiotics and a symptomatic post op anemia treated with 2 unit PRBC transfusion. By time of discharge on POD#4, her pain was controlled on oral pain medications; she had appropriate lochia and was ambulating, voiding without difficulty, tolerating regular diet and passing flatus.   She was deemed stable for discharge to home.    Discharge Diagnoses: Term Pregnancy-delivered and LTCS due to fetal intolerance to labor; postpartum anemia; fever of unknown etiology; Uterine rupture; Chorioamnionitis  Discharge Information: Date: 11/22/2017 Activity: pelvic rest Diet: routine Baby feeding: plans to breastfeed Contraception: oral progesterone-only contraceptive Medications: PNV, Tylenol #3, Ibuprofen and Colace Discharged Condition: good Instructions: refer to practice specific  booklet Discharge to: home  Signed: Purcell NailsMelody N Shambley, CNM 11/22/2017, 11:15 AM   Pt is NOT a TOLAC candidate in future pregnancies.  Herold HarmsMartin A Jhaden Pizzuto, MD

## 2017-11-22 NOTE — Progress Notes (Signed)
Pt discharged with infant.  Discharge instructions, prescriptions and follow up appointment given to and reviewed with pt. Pt verbalized understanding. Escorted out by staff. 

## 2017-11-24 LAB — CULTURE, BLOOD (ROUTINE X 2)
Culture: NO GROWTH
Culture: NO GROWTH
SPECIAL REQUESTS: ADEQUATE
SPECIAL REQUESTS: ADEQUATE

## 2017-11-26 ENCOUNTER — Encounter: Payer: Self-pay | Admitting: Obstetrics and Gynecology

## 2017-11-26 ENCOUNTER — Ambulatory Visit (INDEPENDENT_AMBULATORY_CARE_PROVIDER_SITE_OTHER): Payer: Medicaid Other | Admitting: Obstetrics and Gynecology

## 2017-11-26 VITALS — BP 103/82 | HR 96 | Wt 126.1 lb

## 2017-11-26 DIAGNOSIS — Z98891 History of uterine scar from previous surgery: Secondary | ICD-10-CM

## 2017-11-26 NOTE — Progress Notes (Signed)
  Subjective:     Jerilynn Birkenheadlizabeth K Spangler is a 21 y.o. female who presents to the clinic 1 weeks status post emergency LTCS for fetal distress. Eating a regular diet with difficulty. Bowel movements are normal. Pain is controlled with current analgesics. Medications being used: prescription NSAID's including ibuprofen (Motrin).  The following portions of the patient's history were reviewed and updated as appropriate: allergies, current medications, past family history, past medical history, past social history, past surgical history and problem list.  Review of Systems Pertinent items noted in HPI and remainder of comprehensive ROS otherwise negative.    Objective:    BP 103/82   Pulse 96   Wt 126 lb 1.6 oz (57.2 kg)   Breastfeeding? Yes   BMI 25.47 kg/m  General:  alert, cooperative and appears stated age  Abdomen: soft, bowel sounds active, non-tender  Incision:   healing well, no drainage, no erythema, no hernia, no seroma, no swelling, no dehiscence, incision well approximated     Assessment:    Doing well postoperatively. Operative findings again reviewed. Pathology report discussed.    Plan:    1. Continue any current medications. 2. Wound care discussed. 3. Activity restrictions: continue current restrictions 4. Anticipated return to work: not applicable. 5. Follow up: 5 weeks for suture removal.

## 2017-12-31 ENCOUNTER — Encounter: Payer: Self-pay | Admitting: Certified Nurse Midwife

## 2017-12-31 ENCOUNTER — Encounter: Payer: Medicaid Other | Admitting: Obstetrics and Gynecology

## 2017-12-31 ENCOUNTER — Ambulatory Visit (INDEPENDENT_AMBULATORY_CARE_PROVIDER_SITE_OTHER): Payer: Medicaid Other | Admitting: Certified Nurse Midwife

## 2017-12-31 NOTE — Progress Notes (Signed)
Postpartum visit- Patient doing well. PHQ9 is a 4 today.

## 2017-12-31 NOTE — Patient Instructions (Signed)
Preventive Care 18-39 Years, Female Preventive care refers to lifestyle choices and visits with your health care provider that can promote health and wellness. What does preventive care include?  A yearly physical exam. This is also called an annual well check.  Dental exams once or twice a year.  Routine eye exams. Ask your health care provider how often you should have your eyes checked.  Personal lifestyle choices, including: ? Daily care of your teeth and gums. ? Regular physical activity. ? Eating a healthy diet. ? Avoiding tobacco and drug use. ? Limiting alcohol use. ? Practicing safe sex. ? Taking vitamin and mineral supplements as recommended by your health care provider. What happens during an annual well check? The services and screenings done by your health care provider during your annual well check will depend on your age, overall health, lifestyle risk factors, and family history of disease. Counseling Your health care provider may ask you questions about your:  Alcohol use.  Tobacco use.  Drug use.  Emotional well-being.  Home and relationship well-being.  Sexual activity.  Eating habits.  Work and work Statistician.  Method of birth control.  Menstrual cycle.  Pregnancy history.  Screening You may have the following tests or measurements:  Height, weight, and BMI.  Diabetes screening. This is done by checking your blood sugar (glucose) after you have not eaten for a while (fasting).  Blood pressure.  Lipid and cholesterol levels. These may be checked every 5 years starting at age 66.  Skin check.  Hepatitis C blood test.  Hepatitis B blood test.  Sexually transmitted disease (STD) testing.  BRCA-related cancer screening. This may be done if you have a family history of breast, ovarian, tubal, or peritoneal cancers.  Pelvic exam and Pap test. This may be done every 3 years starting at age 40. Starting at age 59, this may be done every 5  years if you have a Pap test in combination with an HPV test.  Discuss your test results, treatment options, and if necessary, the need for more tests with your health care provider. Vaccines Your health care provider may recommend certain vaccines, such as:  Influenza vaccine. This is recommended every year.  Tetanus, diphtheria, and acellular pertussis (Tdap, Td) vaccine. You may need a Td booster every 10 years.  Varicella vaccine. You may need this if you have not been vaccinated.  HPV vaccine. If you are 69 or younger, you may need three doses over 6 months.  Measles, mumps, and rubella (MMR) vaccine. You may need at least one dose of MMR. You may also need a second dose.  Pneumococcal 13-valent conjugate (PCV13) vaccine. You may need this if you have certain conditions and were not previously vaccinated.  Pneumococcal polysaccharide (PPSV23) vaccine. You may need one or two doses if you smoke cigarettes or if you have certain conditions.  Meningococcal vaccine. One dose is recommended if you are age 27-21 years and a first-year college student living in a residence hall, or if you have one of several medical conditions. You may also need additional booster doses.  Hepatitis A vaccine. You may need this if you have certain conditions or if you travel or work in places where you may be exposed to hepatitis A.  Hepatitis B vaccine. You may need this if you have certain conditions or if you travel or work in places where you may be exposed to hepatitis B.  Haemophilus influenzae type b (Hib) vaccine. You may need this if  you have certain risk factors.  Talk to your health care provider about which screenings and vaccines you need and how often you need them. This information is not intended to replace advice given to you by your health care provider. Make sure you discuss any questions you have with your health care provider. Document Released: 02/03/2002 Document Revised: 08/27/2016  Document Reviewed: 10/09/2015 Elsevier Interactive Patient Education  [employer].

## 2018-01-03 ENCOUNTER — Encounter: Payer: Self-pay | Admitting: Certified Nurse Midwife

## 2018-01-03 NOTE — Progress Notes (Signed)
Subjective:    Tammy Ward is a 22 y.o. 21P1001 Caucasian female who presents for a postpartum visit. She is 6 weeks postpartum following a primary cesarean section, low transverse incision at 39+2 gestational weeks. Anesthesia: epidural. I have fully reviewed the prenatal and intrapartum course.   Postpartum course has been uncomplicated. Baby's course has been uncomplicated. Baby is feeding by breast. Bleeding no bleeding. Bowel function is normal. Bladder function is normal.   Patient is sexually active. Last sexual activity: last week. Contraception method is oral progesterone-only contraceptive. Postpartum depression screening: negative. Score 4.  Last pap N/A. Denies difficulty breathing or respiratory distress, chest pain, abdominal pain, excessive vaginal bleeding, dysuria, and leg pain or swelling.   The following portions of the patient's history were reviewed and updated as appropriate: allergies, current medications, past medical history, past surgical history and problem list.  Review of Systems  Pertinent items are noted in HPI.   Objective:   BP 94/72   Pulse 64   Wt 116 lb (52.6 kg)   Breastfeeding? Yes   BMI 23.43 kg/m   General:  alert, cooperative and no distress   Breasts:  deferred, no complaints  Lungs: clear to auscultation bilaterally  Heart:  regular rate and rhythm  Abdomen: soft, nontender   Vulva: normal  Vagina: normal vagina  Cervix:  closed  Corpus: Well-involuted  Adnexa:  Non-palpable     Depression screen PHQ 2/9 12/31/2017  Decreased Interest 0  Down, Depressed, Hopeless 0  PHQ - 2 Score 0  Altered sleeping 3  Tired, decreased energy 0  Change in appetite 1  Feeling bad or failure about yourself  0  Trouble concentrating 0  Moving slowly or fidgety/restless 0  Suicidal thoughts 0  PHQ-9 Score 4  Difficult doing work/chores Not difficult at all       Assessment:   Postpartum exam Six (6) wks s/p primary  c-section Breastfeeding Depression screening Contraception counseling   Plan:   May return to work or school without restriction.   Education regarding routine health maintenance.   Reviewed red flag symptoms and when to call.   Follow up in: 1 year for Annual Exam or earlier if needed   Gunnar BullaJenkins Michelle Oskar Cretella, CNM [facility], [facility]

## 2018-01-29 ENCOUNTER — Telehealth: Payer: Self-pay | Admitting: Certified Nurse Midwife

## 2018-01-29 NOTE — Telephone Encounter (Signed)
The patient called and stated that she would like for her provider to write her a clearance letter to return to work. No other information was disclosed. Please advise.

## 2018-01-29 NOTE — Telephone Encounter (Signed)
Pt aware letter left up front for p/u. 

## 2018-04-02 IMAGING — DX DG ABD PORTABLE 1V
2 series · 2 of 2 positions shown · non-contrast
Comparison: None.

CLINICAL DATA: Sponge count

EXAM:
PORTABLE ABDOMEN - 1 VIEW

[abdomen kub (1 of 2)]
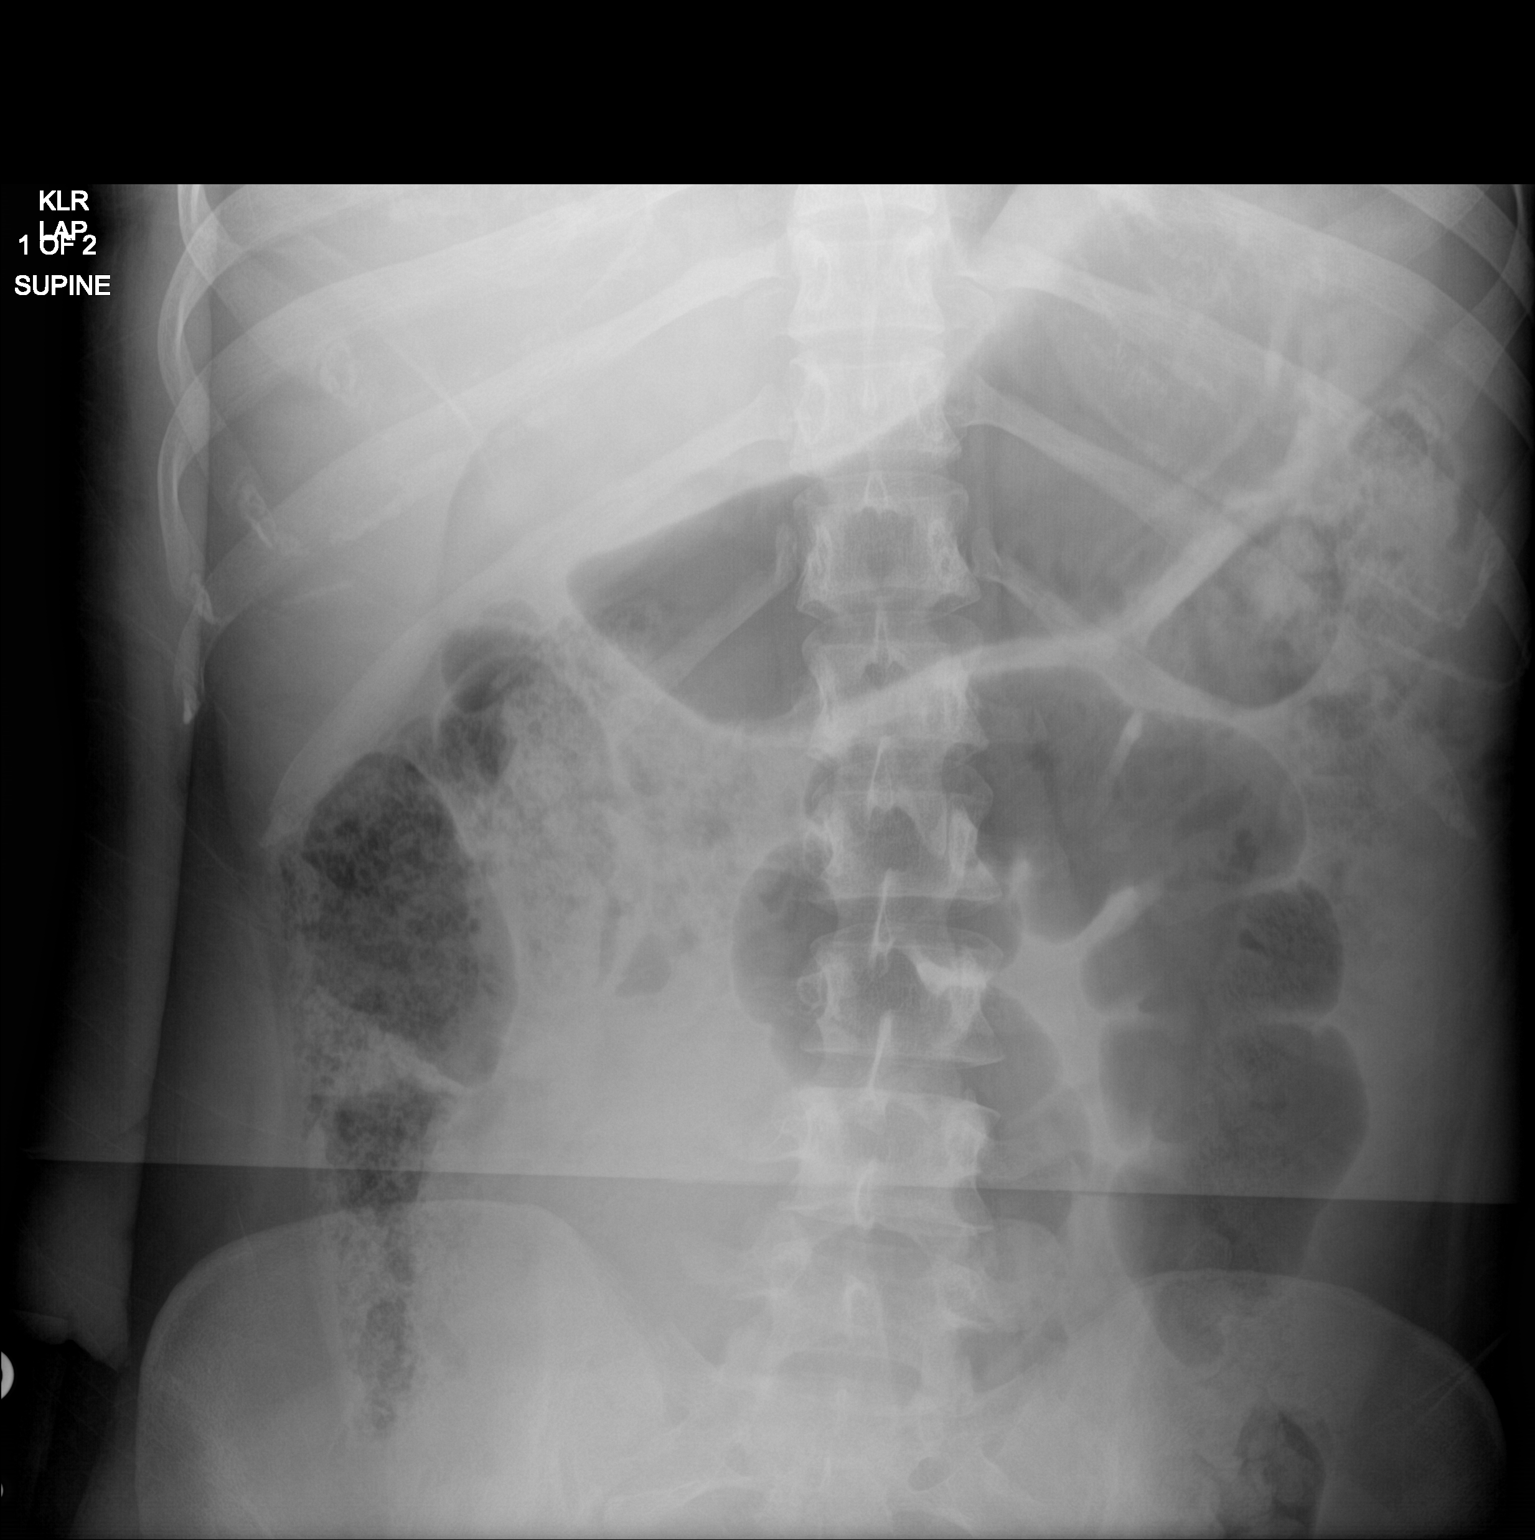

[abdomen kub (2 of 2)]
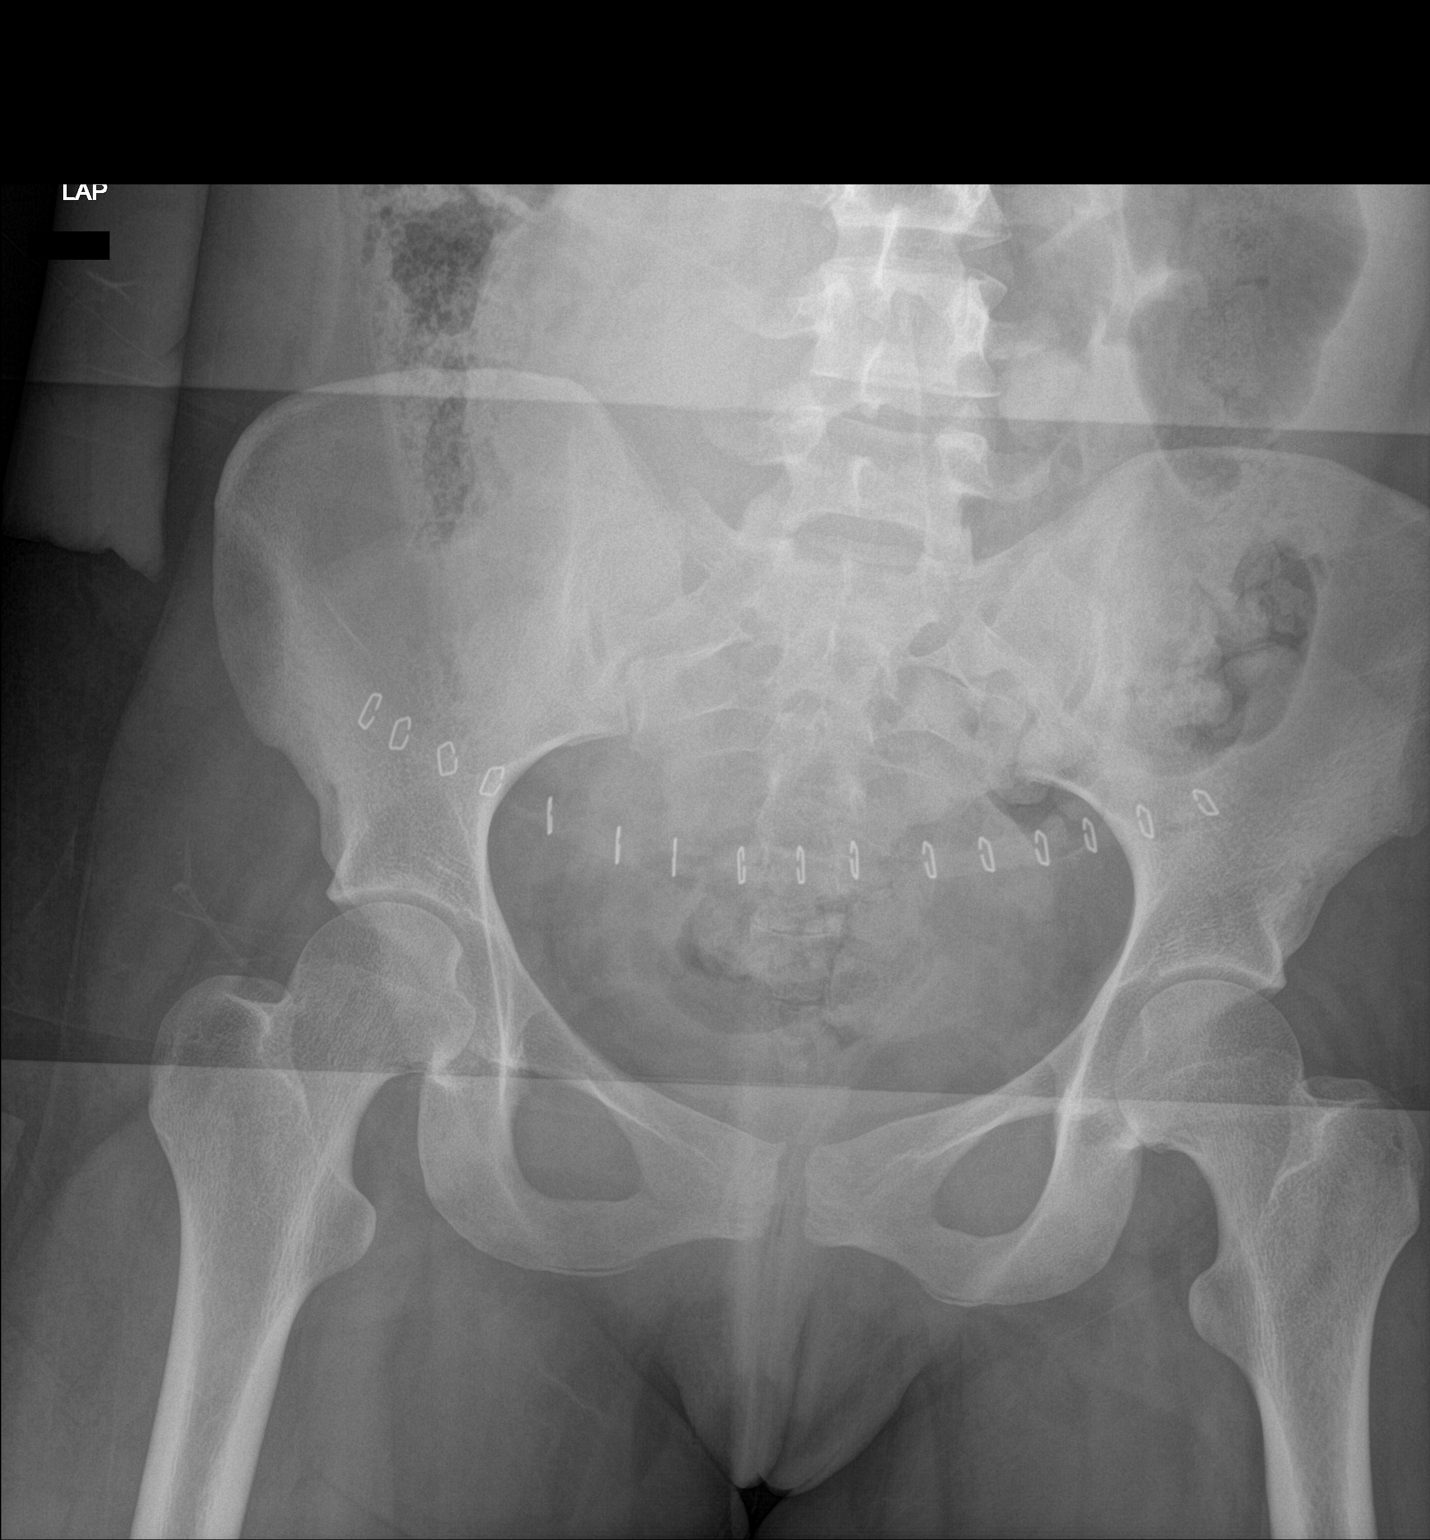

[2 of 2 positions shown; findings below may reference images not displayed]

FINDINGS: The bowel gas pattern is normal. No radio-opaque calculi or other
significant radiographic abnormality are seen. No radiopaque
retained surgical instrument.
IMPRESSION: No radiopaque retained surgical instrument or sponge.

## 2018-12-19 ENCOUNTER — Encounter: Payer: Self-pay | Admitting: Gynecology

## 2018-12-19 ENCOUNTER — Ambulatory Visit
Admission: EM | Admit: 2018-12-19 | Discharge: 2018-12-19 | Disposition: A | Discharge status: Home / Self Care | Payer: Medicaid Other | Attending: Emergency Medicine | Admitting: Emergency Medicine

## 2018-12-19 ENCOUNTER — Other Ambulatory Visit: Payer: Self-pay

## 2018-12-19 DIAGNOSIS — J358 Other chronic diseases of tonsils and adenoids: Secondary | ICD-10-CM

## 2018-12-19 DIAGNOSIS — J309 Allergic rhinitis, unspecified: Secondary | ICD-10-CM | POA: Insufficient documentation

## 2018-12-19 LAB — RAPID STREP SCREEN (MED CTR MEBANE ONLY): Streptococcus, Group A Screen (Direct): NEGATIVE

## 2018-12-19 MED ORDER — FLUTICASONE PROPIONATE 50 MCG/ACT NA SUSP: 2.0000 | Freq: Every day | NASAL | 2 refills | Status: DC

## 2018-12-19 NOTE — Discharge Instructions (Signed)
-  Use Flonase as directed. -Gargle hot salt water at home several times a day. -OTC cough drops  -Follow-up if no improvement.

## 2018-12-19 NOTE — ED Provider Notes (Signed)
[facility]    CSN: [id number] Arrival date & time: 12/19/18  [id number]  History   Chief Complaint No chief complaint on file.   HPI Tammy Ward is a 22 y.o. female who presents today for a 1 day history of sore throat.  The patient denies any recent travels, the patient does have a 20-year-old son at home who has not been sick.  The patient just discovered as well that she is pregnant, she has not been evaluated for this pregnancy just yet as she just found out.  She denies any fevers at home, she does report feelings of postnasal drainage but denies any headache, vision changes or cough at night.  No recent travel.  Denies any shortness of breath or chest pain.  HPI  Past Medical History:  Diagnosis Date  . Kidney stones    kidney stones, cysts  . Left genital labial abscess     There are no active problems to display for this patient.   Past Surgical History:  Procedure Laterality Date  . ABSCESS DRAINAGE  02-02-15   Labial   . CESAREAN SECTION N/A 11/18/2017   Procedure: CESAREAN SECTION;  Surgeon: Herold Harmsefrancesco, Martin A, MD;  Location: [facility] ORS;  Service: Obstetrics;  Laterality: N/A;  . KIDNEY STONE SURGERY  2014    OB History    Gravida  2   Para  1   Term  1   Preterm  0   AB  0   Living  1     SAB  0   TAB  0   Ectopic  0   Multiple  0   Live Births  1        Obstetric Comments  Menstrual age: [age]  Age 1st Pregnancy:NA          Home Medications    Prior to Admission medications   Medication Sig Start Date End Date Taking? Authorizing Provider  Prenatal Vit-Fe Fumarate-FA (PRENATAL VITAMINS) 28-0.8 MG TABS Take 1 tablet by mouth daily. 03/20/17  Yes Lada, Janit BernMelinda P, MD  Cholecalciferol (VITAMIN D3) 5000 units CAPS Take 1 capsule (5,000 Units total) by mouth daily. 11/22/17   Shambley, Melody N, CNM  fluticasone (FLONASE) 50 MCG/ACT nasal spray Place 2 sprays into both nostrils daily. 12/19/18   Anson OregonMcGhee, Mazel Villela Lance, PA-C      Family History Family History  Problem Relation Age of Onset  . Asthma Sister   . Thyroid disease Sister   . Cancer Neg Hx   . Diabetes Neg Hx   . Heart disease Neg Hx     Social History Social History   Tobacco Use  . Smoking status: Never Smoker  . Smokeless tobacco: Never Used  Substance Use Topics  . Alcohol use: No    Alcohol/week: 0.0 standard drinks  . Drug use: No     Allergies   Patient has no known allergies.   Review of Systems Review of Systems  HENT: Positive for postnasal drip and sore throat.   All other systems reviewed and are negative.  Physical Exam Triage Vital Signs ED Triage Vitals  Enc Vitals Group     BP 12/19/18 0824 112/65     Pulse Rate 12/19/18 0824 82     Resp 12/19/18 0824 16     Temp 12/19/18 0824 98.5 F (36.9 C)     Temp Source 12/19/18 0824 Oral     SpO2 12/19/18 0824 100 %     Weight 12/19/18  0826 125 lb (56.7 kg)     Height 12/19/18 0826 4\' 11"  (1.499 m)     Head Circumference --      Peak Flow --      Pain Score 12/19/18 0825 4     Pain Loc --      Pain Edu? --      Excl. in GC? --    No data found.  Updated Vital Signs BP 112/65 (BP Location: Left Arm)   Pulse 82   Temp 98.5 F (36.9 C) (Oral)   Resp 16   Ht 4\' 11"  (1.499 m)   Wt 125 lb (56.7 kg)   LMP 11/19/2018   SpO2 100%   BMI 25.25 kg/m   Visual Acuity Right Eye Distance:   Left Eye Distance:   Bilateral Distance:    Right Eye Near:   Left Eye Near:    Bilateral Near:     Physical Exam Constitutional:      Appearance: Normal appearance. She is well-developed.  HENT:     Right Ear: Tympanic membrane normal.     Left Ear: Tympanic membrane normal.     Nose:     Comments: Mild erythema to bilateral nasal canals.    Mouth/Throat:     Comments: The patient does have mild erythema to the posterior oropharynx.  No purulent drainage is identified.  There does appear to be a small tonsillolith present to the left tonsil however no exudate is  identified. Cardiovascular:     Rate and Rhythm: Normal rate and regular rhythm.     Heart sounds: Normal heart sounds, S1 normal and S2 normal.  Pulmonary:     Effort: Pulmonary effort is normal.     Breath sounds: Normal breath sounds. No wheezing, rhonchi or rales.  Neurological:     Mental Status: She is alert.    UC Treatments / Results  Labs (all labs ordered are listed, but only abnormal results are displayed) Labs Reviewed  RAPID STREP SCREEN ([facility] ONLY)  CULTURE, GROUP A STREP [facility])   EKG None  Radiology No results found.  Procedures Procedures (including critical care time)  Medications Ordered in UC Medications - No data to display  Initial Impression / Assessment and Plan / UC Course  I have reviewed the triage vital signs and the nursing notes.  Pertinent labs & imaging results that were available during my care of the patient were reviewed by me and considered in my medical decision making (see chart for details).     1.  Treatment options were discussed today with the patient. 2.  Rapid strep test was negative.  Pain in throat likely due to small tonsil stone. 3.  The patient was given a prescription for Flonase to begin on a routine basis due to evidence of postnasal drainage. 4.  Encouraged her to rinse her mouth with warm water on a routine basis and take over-the-counter analgesics for throat pain. 5.  The patient will follow up on as-needed basis at this time Final Clinical Impressions(s) / UC Diagnoses   Final diagnoses:  Tonsillolith  Allergic rhinitis, unspecified seasonality, unspecified trigger     Discharge Instructions     -Use Flonase as directed. -Gargle hot salt water at home several times a day. -OTC cough drops  -Follow-up if no improvement.   ED Prescriptions    Medication Sig Dispense Auth. Provider   fluticasone (FLONASE) 50 MCG/ACT nasal spray Place 2 sprays into both nostrils daily. 16  g Anson Oregon,  PA-C     Controlled Substance Prescriptions [place] Controlled Substance Registry consulted? Not Applicable   Anson Oregon, PA-C 12/19/18 1610

## 2018-12-19 NOTE — ED Triage Notes (Signed)
Patient c/o sore throat x last night.

## 2018-12-22 LAB — CULTURE, GROUP A STREP (THRC)

## 2019-01-03 ENCOUNTER — Encounter: Payer: Medicaid Other | Admitting: Certified Nurse Midwife

## 2019-01-04 ENCOUNTER — Encounter: Payer: Self-pay | Admitting: Emergency Medicine

## 2019-01-04 ENCOUNTER — Other Ambulatory Visit: Payer: Self-pay

## 2019-01-04 ENCOUNTER — Ambulatory Visit
Admission: EM | Admit: 2019-01-04 | Discharge: 2019-01-04 | Disposition: A | Discharge status: Home / Self Care | Payer: Self-pay | Attending: Family Medicine | Admitting: Family Medicine

## 2019-01-04 DIAGNOSIS — J101 Influenza due to other identified influenza virus with other respiratory manifestations: Secondary | ICD-10-CM | POA: Insufficient documentation

## 2019-01-04 LAB — RAPID INFLUENZA A&B ANTIGENS: Influenza A (ARMC): NEGATIVE

## 2019-01-04 LAB — RAPID INFLUENZA A&B ANTIGENS (ARMC ONLY): INFLUENZA B (ARMC): POSITIVE — AB

## 2019-01-04 MED ORDER — OSELTAMIVIR PHOSPHATE 75 MG PO CAPS: 75.0000 mg | ORAL_CAPSULE | Freq: Two times a day (BID) | ORAL | 0 refills | Status: DC

## 2019-01-04 MED ORDER — BUDESONIDE 32 MCG/ACT NA SUSP: 2.0000 | Freq: Every day | NASAL | 0 refills | Status: DC

## 2019-01-04 MED ORDER — DEXTROMETHORPHAN HBR 15 MG/5ML PO SYRP: 10.0000 mL | ORAL_SOLUTION | Freq: Four times a day (QID) | ORAL | 0 refills | Status: DC | PRN

## 2019-01-04 NOTE — Discharge Instructions (Addendum)
Drink plenty of fluids.  Get rest as much as possible.  Clear with your OB for all medications that you are taking.  Tylenol or Motrin for body aches and fever.

## 2019-01-04 NOTE — ED Provider Notes (Signed)
[facility]    CSN: [id number] Arrival date & time: 01/04/19  1832     History   Chief Complaint Chief Complaint  Patient presents with  . Fever  . Cough  . Generalized Body Aches    HPI Tammy Ward is a 23 y.o. female.   HPI  23 year old female presents with fever body aches cough and nasal congestion that she started on Saturday worsened on Sunday.  Is [redacted] weeks pregnant.  Has a 51-year-old son at home.  Temperature today is 98.9 but she has tachycardia at 107 O2 sats 100% and respirations 16.  Taking any medications because of the pregnancy.          Past Medical History:  Diagnosis Date  . Kidney stones    kidney stones, cysts  . Left genital labial abscess     There are no active problems to display for this patient.   Past Surgical History:  Procedure Laterality Date  . ABSCESS DRAINAGE  02-02-15   Labial   . CESAREAN SECTION N/A 11/18/2017   Procedure: CESAREAN SECTION;  Surgeon: Herold Harms, MD;  Location: [facility] ORS;  Service: Obstetrics;  Laterality: N/A;  . KIDNEY STONE SURGERY  2014    OB History    Gravida  2   Para  1   Term  1   Preterm  0   AB  0   Living  1     SAB  0   TAB  0   Ectopic  0   Multiple  0   Live Births  1        Obstetric Comments  Menstrual age: 66  Age 1st Pregnancy:NA          Home Medications    Prior to Admission medications   Medication Sig Start Date End Date Taking? Authorizing Provider  Prenatal Vit-Fe Fumarate-FA (PRENATAL VITAMINS) 28-0.8 MG TABS Take 1 tablet by mouth daily. 03/20/17  Yes Lada, Janit Bern, MD  budesonide (RHINOCORT ALLERGY) 32 MCG/ACT nasal spray Place 2 sprays into both nostrils daily. 01/04/19   Lutricia Feil, PA-C  dextromethorphan 15 MG/5ML syrup Take 10 mLs (30 mg total) by mouth 4 (four) times daily as needed for cough. 01/04/19   Lutricia Feil, PA-C  fluticasone (FLONASE) 50 MCG/ACT nasal spray Place 2 sprays into both nostrils  daily. 12/19/18   Anson Oregon, PA-C  oseltamivir (TAMIFLU) 75 MG capsule Take 1 capsule (75 mg total) by mouth every 12 (twelve) hours. 01/04/19   Lutricia Feil, PA-C    Family History Family History  Problem Relation Age of Onset  . Asthma Sister   . Thyroid disease Sister   . Cancer Neg Hx   . Diabetes Neg Hx   . Heart disease Neg Hx     Social History Social History   Tobacco Use  . Smoking status: Never Smoker  . Smokeless tobacco: Never Used  Substance Use Topics  . Alcohol use: No    Alcohol/week: 0.0 standard drinks  . Drug use: No     Allergies   Patient has no known allergies.   Review of Systems Review of Systems  Constitutional: Positive for activity change, appetite change, chills, fatigue and fever.  HENT: Positive for congestion, postnasal drip, rhinorrhea, sinus pressure and sinus pain.   Respiratory: Positive for cough.   All other systems reviewed and are negative.    Physical Exam Triage Vital Signs ED Triage Vitals  Enc  Vitals Group     BP 01/04/19 1842 114/77     Pulse Rate 01/04/19 1842 (!) 107     Resp 01/04/19 1842 16     Temp 01/04/19 1842 98.9 F (37.2 C)     Temp Source 01/04/19 1842 Oral     SpO2 01/04/19 1842 100 %     Weight 01/04/19 1840 120 lb (54.4 kg)     Height 01/04/19 1840 4\' 11"  (1.499 m)     Head Circumference --      Peak Flow --      Pain Score 01/04/19 1839 2     Pain Loc --      Pain Edu? --      Excl. in GC? --    No data found.  Updated Vital Signs BP 114/77 (BP Location: Left Arm)   Pulse (!) 107   Temp 98.9 F (37.2 C) (Oral)   Resp 16   Ht 4\' 11"  (1.499 m)   Wt 120 lb (54.4 kg)   LMP 11/19/2018   SpO2 100%   Breastfeeding Yes   BMI 24.24 kg/m   Visual Acuity Right Eye Distance:   Left Eye Distance:   Bilateral Distance:    Right Eye Near:   Left Eye Near:    Bilateral Near:     Physical Exam Vitals signs and nursing note reviewed.  Constitutional:      General: She is not  in acute distress.    Appearance: Normal appearance. She is ill-appearing. She is not toxic-appearing or diaphoretic.  HENT:     Head: Normocephalic.     Right Ear: Tympanic membrane normal.     Left Ear: Tympanic membrane and ear canal normal.     Nose: Congestion and rhinorrhea present.     Mouth/Throat:     Mouth: Mucous membranes are moist.     Pharynx: No oropharyngeal exudate or posterior oropharyngeal erythema.  Eyes:     General:        Right eye: No discharge.        Left eye: No discharge.     Conjunctiva/sclera: Conjunctivae normal.  Neck:     Musculoskeletal: Normal range of motion and neck supple.  Pulmonary:     Effort: Pulmonary effort is normal.     Breath sounds: Normal breath sounds.  Musculoskeletal: Normal range of motion.  Lymphadenopathy:     Cervical: No cervical adenopathy.  Skin:    General: Skin is warm and dry.  Neurological:     General: No focal deficit present.     Mental Status: She is alert and oriented to person, place, and time.  Psychiatric:        Mood and Affect: Mood normal.        Behavior: Behavior normal.        Thought Content: Thought content normal.        Judgment: Judgment normal.      UC Treatments / Results  Labs (all labs ordered are listed, but only abnormal results are displayed) Labs Reviewed  RAPID INFLUENZA A&B ANTIGENS ([facility] ONLY) - Abnormal; Notable for the following components:      Result Value   Influenza B ([facility]) POSITIVE (*)    All other components within normal limits    EKG None  Radiology No results found.  Procedures Procedures (including critical care time)  Medications Ordered in UC Medications - No data to display  Initial Impression / Assessment and Plan / UC Course  I have reviewed the triage vital signs and the nursing notes.  Pertinent labs & imaging results that were available during my care of the patient were reviewed by me and considered in my medical decision making (see chart  for details).   The patient she has influenza B.  Recommended the use of Tamiflu and she is agreeable.  So recommended Rhinocort for her nasal congestion and dextromethorphan for any coughing.  Told her she should clear all medications with her OB prior to taking them.  Her first appointment for this pregnancy next Thursday.   Final Clinical Impressions(s) / UC Diagnoses   Final diagnoses:  Influenza B     Discharge Instructions     Drink plenty of fluids.  Get rest as much as possible.  Clear with your OB for all medications that you are taking.  Tylenol or Motrin for body aches and fever.   ED Prescriptions    Medication Sig Dispense Auth. Provider   oseltamivir (TAMIFLU) 75 MG capsule Take 1 capsule (75 mg total) by mouth every 12 (twelve) hours. 10 capsule Ovid Curdoemer, William P, PA-C   budesonide (RHINOCORT ALLERGY) 32 MCG/ACT nasal spray Place 2 sprays into both nostrils daily. 1 Bottle Lutricia Feiloemer, William P, PA-C   dextromethorphan 15 MG/5ML syrup Take 10 mLs (30 mg total) by mouth 4 (four) times daily as needed for cough. 120 mL Lutricia Feiloemer, William P, PA-C     Controlled Substance Prescriptions [place] Controlled Substance Registry consulted? Not Applicable   Lutricia FeilRoemer, William P, PA-C 01/04/19 1938

## 2019-01-04 NOTE — ED Triage Notes (Signed)
Patient c/o fever, bodyaches, cough and nasal congestion since Saturday.

## 2019-01-06 ENCOUNTER — Ambulatory Visit: Payer: Self-pay | Admitting: Certified Nurse Midwife

## 2019-01-06 ENCOUNTER — Encounter: Payer: Self-pay | Admitting: Certified Nurse Midwife

## 2019-01-06 VITALS — BP 89/64 | HR 88 | Ht 59.0 in | Wt 119.8 lb

## 2019-01-06 DIAGNOSIS — Z98891 History of uterine scar from previous surgery: Secondary | ICD-10-CM

## 2019-01-06 DIAGNOSIS — Z8759 Personal history of other complications of pregnancy, childbirth and the puerperium: Secondary | ICD-10-CM

## 2019-01-06 DIAGNOSIS — Z87828 Personal history of other (healed) physical injury and trauma: Secondary | ICD-10-CM

## 2019-01-06 DIAGNOSIS — N912 Amenorrhea, unspecified: Secondary | ICD-10-CM

## 2019-01-06 LAB — POCT URINE PREGNANCY: PREG TEST UR: POSITIVE — AB

## 2019-01-06 NOTE — Progress Notes (Signed)
GYN ENCOUNTER NOTE  Subjective:       Tammy Ward is a 23 y.o. G35P1001 female here for pregnancy confirmation.   Reports positive home pregnancy test. Still breastfeeding. Menses was irregular during the initial postpartum period.   Reports nausea without vomiting and decreased milk supply. Also, diagnosed with influenza b approximately two (2) days ago.   Denies difficulty breathing or respiratory distress, chest pain, abdominal pain, vaginal bleeding, dysuria, and leg pain or swelling.   History significant for cesarean section due to uterine rupture.   Gynecologic History  Patient's last menstrual period was 11/19/2018 (exact date).  Period Cycle (Days): 28 Period Duration (Days): 6 Period Pattern: Regular Menstrual Flow: Moderate Menstrual Control: Tampon, Thin pad Dysmenorrhea: None  Gestational age: 80 weeks 6 days  Estimated date of birth: 08/26/2019  Contraception: none  Last Pap: due.   Obstetric History  OB History  Gravida Para Term Preterm AB Living  2 1 1  0 0 1  SAB TAB Ectopic Multiple Live Births  0 0 0 0 1    # Outcome Date GA Lbr Len/2nd Weight Sex Delivery Anes PTL Lv  2 Current           1 Term 11/18/17 [redacted]w[redacted]d  7 lb 5.8 oz (3.34 kg) M CS-LTranv Gen  LIV    Obstetric Comments  Menstrual age: 33    Age 1st Pregnancy:NA    Past Medical History:  Diagnosis Date  . Kidney stones    kidney stones, cysts  . Left genital labial abscess     Past Surgical History:  Procedure Laterality Date  . ABSCESS DRAINAGE  02-02-15   Labial   . CESAREAN SECTION N/A 11/18/2017   Procedure: CESAREAN SECTION;  Surgeon: Herold Harms, MD;  Location: [facility] ORS;  Service: Obstetrics;  Laterality: N/A;  . KIDNEY STONE SURGERY  2014    Current Outpatient Medications on File Prior to Visit  Medication Sig Dispense Refill  . budesonide (RHINOCORT ALLERGY) 32 MCG/ACT nasal spray Place 2 sprays into both nostrils daily. 1 Bottle 0  . oseltamivir  (TAMIFLU) 75 MG capsule Take 1 capsule (75 mg total) by mouth every 12 (twelve) hours. 10 capsule 0  . Prenatal Vit-Fe Fumarate-FA (PRENATAL VITAMINS) 28-0.8 MG TABS Take 1 tablet by mouth daily. 30 tablet 11   No current facility-administered medications on file prior to visit.     No Known Allergies  Social History   Socioeconomic History  . Marital status: Single    Spouse name: Not on file  . Number of children: Not on file  . Years of education: Not on file  . Highest education level: Not on file  Occupational History  . Not on file  Social Needs  . Financial resource strain: Not on file  . Food insecurity:    Worry: Not on file    Inability: Not on file  . Transportation needs:    Medical: Not on file    Non-medical: Not on file  Tobacco Use  . Smoking status: Never Smoker  . Smokeless tobacco: Never Used  Substance and Sexual Activity  . Alcohol use: No    Alcohol/week: 0.0 standard drinks  . Drug use: No  . Sexual activity: Yes    Birth control/protection: None  Lifestyle  . Physical activity:    Days per week: Not on file    Minutes per session: Not on file  . Stress: Not on file  Relationships  . Social connections:  Talks on phone: Not on file    Gets together: Not on file    Attends religious service: Not on file    Active member of club or organization: Not on file    Attends meetings of clubs or organizations: Not on file    Relationship status: Not on file  . Intimate partner violence:    Fear of current or ex partner: Not on file    Emotionally abused: Not on file    Physically abused: Not on file    Forced sexual activity: Not on file  Other Topics Concern  . Not on file  Social History Narrative  . Not on file    Family History  Problem Relation Age of Onset  . Asthma Sister   . Thyroid disease Sister   . Cancer Neg Hx   . Diabetes Neg Hx   . Heart disease Neg Hx   . Breast cancer Neg Hx   . Ovarian cancer Neg Hx   . Colon cancer  Neg Hx     The following portions of the patient's history were reviewed and updated as appropriate: allergies, current medications, past family history, past medical history, past social history, past surgical history and problem list.  Review of Systems  ROS negative except as noted above. Information obtained from patient.   Objective:   BP (!) 89/64   Pulse 88   Ht 4\' 11"  (1.499 m)   Wt 119 lb 12.8 oz (54.3 kg)   LMP 11/19/2018 (Exact Date)   Breastfeeding Yes   BMI 24.20 kg/m    CONSTITUTIONAL: Well-developed, well-nourished female in no acute distress.   Recent Results (from the past 2160 hour(s))  Rapid Strep Screen ([facility] ONLY)     Status: None   Collection Time: 12/19/18  8:29 AM  Result Value Ref Range   Streptococcus, Group A Screen (Direct) NEGATIVE NEGATIVE    Comment: (NOTE) A Rapid Antigen test may result negative if the antigen level in the sample is below the detection level of this test. The FDA has not cleared this test as a stand-alone test therefore the rapid antigen negative result has reflexed to a Group A Strep culture. Performed at [facility], [address] [place], [place] [address]   Culture, group A strep     Status: None   Collection Time: 12/19/18  8:29 AM  Result Value Ref Range   Specimen Description      THROAT Performed at [facility], [address] [place], [place] [address]    Special Requests      NONE Reflexed from [id number] Performed at [facility], [address], [place], [place] [address]    Culture      NO GROUP A STREP (S.PYOGENES) ISOLATED Performed at [facility], [address] [place], [place] [address]    Report Status 12/22/2018 FINAL   Rapid Influenza A&B Antigens ([facility] only)     Status: Abnormal   Collection Time: 01/04/19  6:43 PM  Result Value Ref Range   Influenza A ([facility]) NEGATIVE NEGATIVE   Influenza B ([facility]) POSITIVE (A) NEGATIVE    Comment:  Performed at [facility], [address] [place], [place] [address]  POCT urine pregnancy     Status: Abnormal   Collection Time: 01/06/19 11:07 AM  Result Value Ref Range   Preg Test, Ur Positive (A) Negative       Assessment:  1. Amenorrhea  - POCT urine pregnancy - US OB Transvaginal; Future  2. History of rupture of uterus  - US OB Transvaginal; Future    Plan:   First trimester education, see AVS.  Operative noted reviewed with Dr. Valentino Saxonherry and patient. At this time, we advise a repeat cesarean section as mode of birth due to history of uterine rupture.   Discussed home treatment measures for management of flu symptoms; continue Tamiflu.  Reviewed red flag symptoms and when to call.   RTC x 2-3 weeks for dating/viability ultrasound and nurse intake or sooner if needed.    Gunnar BullaJenkins Michelle Akil Hoos, CNM [facility] 01/06/19 12:07 PM

## 2019-01-06 NOTE — Progress Notes (Signed)
Patient here for pregnancy confirmation.  Patient went to urgent care 2 days ago and was diagnosed with the flu, currently taking Tamiflu and feeling "a lot better".   Temp today 98.0

## 2019-01-06 NOTE — Patient Instructions (Addendum)
WE WOULD LOVE TO HEAR FROM YOU!!!!   Thank you Tammy Ward for visiting [facility].  Providing our patients with the best experience possible is really important to [place], and we hope that you felt that on your recent visit. The most valuable feedback we get comes from [place]!!    If you receive a survey please take a couple of minutes to let us know how we did.Thank you for continuing to trust [place] with your care.   [facility]   WHAT OB PATIENTS CAN EXPECT   Confirmation of pregnancy and ultrasound ordered if medically indicated-[redacted] weeks gestation  New OB (NOB) intake with nurse and New OB (NOB) labs- [redacted] weeks gestation  New OB (NOB) physical examination with provider- 11/[redacted] weeks gestation  Flu vaccine-[redacted] weeks gestation  Anatomy scan-[redacted] weeks gestation  Glucose tolerance test, blood work to test for anemia, T-dap vaccine-[redacted] weeks gestation  Vaginal swabs/cultures-STD/Group B strep-[redacted] weeks gestation  Appointments every 4 weeks until 28 weeks  Every 2 weeks from 28 weeks until 36 weeks  Weekly visits from 36 weeks until delivery  Eating Plan for Pregnant Women While you are pregnant, your body requires additional nutrition to help support your growing baby. You also have a higher need for some vitamins and minerals, such as folic acid, calcium, iron, and vitamin D. Eating a healthy, well-balanced diet is very important for your health and your baby's health. Your need for extra calories varies for the three 2-monthsegments of your pregnancy (trimesters). For most women, it is recommended to consume:  150 extra calories a day during the first trimester.  300 extra calories a day during the second trimester.  300 extra calories a day during the third trimester. What are tips for following this plan?   Do not try to lose weight or go on a diet during pregnancy.  Limit your overall intake of foods that have "empty calories." These are foods that  have little nutritional value, such as sweets, desserts, candies, and sugar-sweetened beverages.  Eat a variety of foods (especially fruits and vegetables) to get a full range of vitamins and minerals.  Take a prenatal vitamin to help meet your additional vitamin and mineral needs during pregnancy, specifically for folic acid, iron, calcium, and vitamin D.  Remember to stay active. Ask your health care provider what types of exercise and activities are safe for you.  Practice good food safety and cleanliness. Wash your hands before you eat and after you prepare raw meat. Wash all fruits and vegetables well before peeling or eating. Taking these actions can help to prevent food-borne illnesses that can be very dangerous to your baby, such as listeriosis. Ask your health care provider for more information about listeriosis. What does 150 extra calories look like? Healthy options that provide 150 extra calories each day could be any of the following:  6-8 oz (170-230 g) of plain low-fat yogurt with  cup of berries.  1 apple with 2 teaspoons (11 g) of peanut butter.  Cut-up vegetables with  cup (60 g) of hummus.  8 oz (230 mL) or 1 cup of low-fat chocolate milk.  1 stick of string cheese with 1 medium orange.  1 peanut butter and jelly sandwich that is made with one slice of whole-wheat bread and 1 tsp (5 g) of peanut butter. For 300 extra calories, you could eat two of those healthy options each day. What is a healthy amount of weight to gain? The  right amount of weight gain for you is based on your BMI before you became pregnant. If your BMI:  Was less than 18 (underweight), you should gain 28-40 lb (13-18 kg).  Was 18-24.9 (normal), you should gain 25-35 lb (11-16 kg).  Was 25-29.9 (overweight), you should gain 15-25 lb (7-11 kg).  Was 30 or greater (obese), you should gain 11-20 lb (5-9 kg). What if I am having twins or multiples? Generally, if you are carrying twins or  multiples:  You may need to eat 300-600 extra calories a day.  The recommended range for total weight gain is 25-54 lb (11-25 kg), depending on your BMI before pregnancy.  Talk with your health care provider to find out about nutritional needs, weight gain, and exercise that is right for you. What foods can I eat?  Grains All grains. Choose whole grains, such as whole-wheat bread, oatmeal, or brown rice. Vegetables All vegetables. Eat a variety of colors and types of vegetables. Remember to wash your vegetables well before peeling or eating. Fruits All fruits. Eat a variety of colors and types of fruit. Remember to wash your fruits well before peeling or eating. Meats and other protein foods Lean meats, including chicken, [place], fish, and lean cuts of beef, veal, or pork. If you eat fish or seafood, choose options that are higher in omega-3 fatty acids and lower in mercury, such as salmon, herring, mussels, trout, sardines, pollock, shrimp, crab, and lobster. Tofu. Tempeh. Beans. Eggs. Peanut butter and other nut butters. Make sure that all meats, poultry, and eggs are cooked to food-safe temperatures or "well-done." Two or more servings of fish are recommended each week in order to get the most benefits from omega-3 fatty acids that are found in seafood. Choose fish that are lower in mercury. You can find more information online:  [place]Gaming.ch Dairy Pasteurized milk and milk alternatives (such as almond milk). Pasteurized yogurt and pasteurized cheese. Cottage cheese. Sour cream. Beverages Water. Juices that contain 100% fruit juice or vegetable juice. Caffeine-free teas and decaffeinated coffee. Drinks that contain caffeine are okay to drink, but it is better to avoid caffeine. Keep your total caffeine intake to less than 200 mg each day (which is 12 oz or 355 mL of coffee, tea, or soda) or the limit as told by your health care provider. Fats and oils Fats and oils are okay to include in  moderation. Sweets and desserts Sweets and desserts are okay to include in moderation. Seasoning and other foods All pasteurized condiments. The items listed above may not be a complete list of recommended foods and beverages. Contact your dietitian for more options. What foods are not recommended? Vegetables Raw (unpasteurized) vegetable juices. Fruits Unpasteurized fruit juices. Meats and other protein foods Lunch meats, bologna, hot dogs, or other deli meats. (If you must eat those meats, reheat them until they are steaming hot.) Refrigerated pat, meat spreads from a meat counter, smoked seafood that is found in the refrigerated section of a store. Raw or undercooked meats, poultry, and eggs. Raw fish, such as sushi or sashimi. Fish that have high mercury content, such as tilefish, shark, swordfish, and king mackerel. To learn more about mercury in fish, talk with your health care provider or look for online resources, such as:  [place]Gaming.ch Dairy Raw (unpasteurized) milk and any foods that have raw milk in them. Soft cheeses, such as feta, queso blanco, queso fresco, Brie, Camembert cheeses, blue-veined cheeses, and Panela cheese (unless it is made with pasteurized  milk, which must be stated on the label). Beverages Alcohol. Sugar-sweetened beverages, such as sodas, teas, or energy drinks. Seasoning and other foods Homemade fermented foods and drinks, such as pickles, sauerkraut, or kombucha drinks. (Store-bought pasteurized versions of these are okay.) Salads that are made in a store or deli, such as ham salad, chicken salad, egg salad, tuna salad, and seafood salad. The items listed above may not be a complete list of foods and beverages to avoid. Contact your dietitian for more information. Where to find more information To calculate the number of calories you need based on your height, weight, and activity level, you can use an online calculator such  as:  MobileTransition.ch To calculate how much weight you should gain during pregnancy, you can use an online pregnancy weight gain calculator such as:  StreamingFood.com.cy Summary  While you are pregnant, your body requires additional nutrition to help support your growing baby.  Eat a variety of foods, especially fruits and vegetables to get a full range of vitamins and minerals.  Practice good food safety and cleanliness. Wash your hands before you eat and after you prepare raw meat. Wash all fruits and vegetables well before peeling or eating. Taking these actions can help to prevent food-borne illnesses, such as listeriosis, that can be very dangerous to your baby.  Do not eat raw meat or fish. Do not eat fish that have high mercury content, such as tilefish, shark, swordfish, and king mackerel. Do not eat unpasteurized (raw) dairy.  Take a prenatal vitamin to help meet your additional vitamin and mineral needs during pregnancy, specifically for folic acid, iron, calcium, and vitamin D. This information is not intended to replace advice given to you by your health care provider. Make sure you discuss any questions you have with your health care provider. Document Released: 09/22/2014 Document Revised: 09/04/2017 Document Reviewed: 09/04/2017 Elsevier Interactive Patient Education  2019 [employer]. Common Medications Safe in Pregnancy  Acne:      Constipation:  Benzoyl Peroxide     Colace  Clindamycin      Dulcolax Suppository  Topica Erythromycin     Fibercon  Salicylic Acid      Metamucil         Miralax AVOID:        Senakot   Accutane    Cough:  Retin-A       Cough Drops  Tetracycline      Phenergan w/ Codeine if Rx  Minocycline      Robitussin (Plain &  DM)  Antibiotics:     Crabs/Lice:  Ceclor       RID  Cephalosporins    AVOID:  E-Mycins      Kwell  Keflex  Macrobid/Macrodantin   Diarrhea:  Penicillin      Kao-Pectate  Zithromax      Imodium AD         PUSH FLUIDS AVOID:       Cipro     Fever:  Tetracycline      Tylenol (Regular or Extra  Minocycline       Strength)  Levaquin      Extra Strength-Do not          Exceed 8 tabs/24 hrs Caffeine:        <261m/day (equiv. To 1 cup of coffee or  approx. 3 12 oz sodas)         Gas: Cold/Hayfever:       Gas-X  Benadryl  Mylicon  Claritin       Phazyme  **Claritin-D        Chlor-Trimeton    Headaches:  Dimetapp      ASA-Free Excedrin  Drixoral-Non-Drowsy     Cold Compress  Mucinex (Guaifenasin)     Tylenol (Regular or Extra  Sudafed/Sudafed-12 Hour     Strength)  **Sudafed PE Pseudoephedrine   Tylenol Cold & Sinus     Vicks Vapor Rub  Zyrtec  **AVOID if Problems With Blood Pressure         Heartburn: Avoid lying down for at least 1 hour after meals  Aciphex      Maalox     Rash:  Milk of Magnesia     Benadryl    Mylanta       1% Hydrocortisone Cream  Pepcid  Pepcid Complete   Sleep Aids:  Prevacid      Ambien   Prilosec       Benadryl  Rolaids       Chamomile Tea  Tums (Limit 4/day)     Unisom  Zantac       Tylenol PM         Warm milk-add vanilla or  Hemorrhoids:       Sugar for taste  Anusol/Anusol H.C.  (RX: Analapram 2.5%)  Sugar Substitutes:  Hydrocortisone OTC     Ok in moderation  Preparation H      Tucks        Vaseline lotion applied to tissue with wiping    Herpes:     Throat:  Acyclovir      Oragel  Famvir  Valtrex     Vaccines:         Flu Shot Leg Cramps:       *Gardasil  Benadryl      Hepatitis A         Hepatitis B Nasal Spray:       Pneumovax  Saline Nasal Spray     Polio Booster         Tetanus Nausea:       Tuberculosis test or PPD  Vitamin B6 25 mg TID   AVOID:    Dramamine      *Gardasil  Emetrol       Live  Poliovirus  Ginger Root 250 mg QID    MMR (measles, mumps &  High Complex Carbs @ Bedtime    rebella)  Sea Bands-Accupressure    Varicella (Chickenpox)  Unisom 1/2 tab TID     *No known complications           If received before Pain:         Known pregnancy;   Darvocet       Resume series after  Lortab        Delivery  Percocet    Yeast:   Tramadol      Femstat  Tylenol 3      Gyne-lotrimin  Ultram       Monistat  Vicodin           MISC:         All Sunscreens           Hair Coloring/highlights          Insect Repellant's          (Including DEET)         Mystic Tans    Breastfeeding During Pregnancy Deciding whether to continue breastfeeding during a pregnancy is an  individual choice. Breastfeeding during pregnancy is generally not risky. Your nursing child may naturally stop breastfeeding (wean) during your pregnancy. If you have problems during pregnancy, you may be advised to stop breastfeeding. Work with your health care provider to help decide if breastfeeding during pregnancy is right for you. What should I consider when deciding whether to breastfeed during pregnancy? When deciding whether to continue breastfeeding while you are pregnant, you may want to consider:  The age of your nursing child and his or her physical and emotional needs.  Any health concerns related to your pregnancy. It may not be safe to continue breastfeeding if you have certain problems, such as: ? Uterine pain or bleeding. ? A history of preterm labor and delivery. ? Problems gaining weight or losing weight during pregnancy. ? A history of cervical insufficiency. This is a condition in which the cervix begins to thin and soften before your due date.  Whether you have any problems associated with breastfeeding. Some common problems experienced when breastfeeding during pregnancy include: ? Nipple tenderness and breast soreness. ? Nausea. ? Discomfort while breastfeeding due to the growing  belly. ? Fatigue. ? Reduced milk supply. This may mean having fewer feedings a day. ? Changes in how your milk tastes. ? Uterine contractions. Follow these instructions at home:   Keep an open mind about how your breastfeeding experience will be. Avoid setting rigid expectations for yourself. Your needs and your nursing child's needs are likely to change as your pregnancy progresses.  Make sure that you are gaining a healthy amount of weight and eating enough calories.  Eat a healthy diet that includes fresh fruits and vegetables, whole grains, lean meat, fish, eggs, beans, nuts, and seeds, and low-fat dairy products.  Drink plenty of fluids so your urine is clear or pale yellow.  Work with your health care provider and your child's health care provider as needed.  Keep track of your nursing child's weight. If your nursing baby is younger than twelve months, the normal decrease of your milk supply that happens during pregnancy could keep your baby from getting all the milk he or she needs.  Keep track of your nursing child's daily wet diapers and bowel movements to make sure he or she is staying hydrated. Your baby should have 6-8 wet diapers and at least 3 stools each day.  Talk to your health care provider or lactation specialist before starting any new medications or supplements. This is to make sure that they are safe for both pregnancy and breastfeeding. Where to find more information  [employer]: http://www.llli.org Contact a health care provider if:  Your breasts become large and painful (engorged).  Your nursing child is urinating or having bowel movements less often than normal. Summary  Breastfeeding during pregnancy is generally not risky. However, if you have problems during pregnancy, you may be advised to stop breastfeeding.  During pregnancy, your milk supply naturally decreases. Your nursing child may wean himself or herself naturally during your  pregnancy.  Keep track of your nursing child's weight, wet diapers, and stools to make sure that he or she is getting enough milk.  Keep an open mind about how your breastfeeding experience will be. Avoid setting rigid expectations for yourself. This information is not intended to replace advice given to you by your health care provider. Make sure you discuss any questions you have with your health care provider. Document Released: 04/07/2005 Document Revised: 12/09/2016 Document Reviewed: 12/09/2016 Elsevier Interactive Patient  Education  2019 [employer].   Preparing for Vaginal Birth After Cesarean Delivery Vaginal birth after cesarean delivery (VBAC) is giving birth vaginally after previously delivering a baby through a cesarean section (C-section). You and your health are provider will discuss your options and whether you may be a good candidate for VBAC. What are my options? After a cesarean delivery, your options for future deliveries may include:  Scheduled repeat cesarean delivery. This is done in a hospital with an operating room.  Trial of labor after cesarean (TOLAC). A successful TOLAC results in a vaginal delivery. If it is not successful, you will need to have a cesarean delivery. TOLAC should be attempted in facilities where an emergency cesarean delivery can be performed. It should not be done as a home birth. Talk with your health care provider about the risks and benefits of each option early in your pregnancy. The best option for you will depend on your preferences and your overall health as well as your baby's. What should I know about my past cesarean delivery? It is important to know what type of incision was made in your uterus in a past cesarean delivery. The type of incision can affect the success of your TOLAC. Types of incisions include:  Low transverse. This is a side-to-side cut low on your uterus. The scar on your skin looks like a horizontal line just above  your pubic area. This type of cut is the most common and makes you a good candidate for TOLAC.  Low vertical. This is an up-and-down cut low on your uterus. The scar on your skin looks like a vertical line between your pubic area and belly button. This type of cut puts you at higher risk for problems during TOLAC.  High vertical or classical. This is an up-and-down cut high on your uterus. The scar on your skin looks like a vertical line that runs over the top of your belly button. This type of cut has the highest risk for problems and usually means that TOLAC is not an option. When is VBAC not an option? As you progress through your pregnancy, circumstances may change and you may need to reconsider your options. Your situation may also change even as you begin TOLAC. Your health care provider may not want you to attempt a VBAC if you:  Need to have labor started (induced) because your cervix is not ready for labor.  Have never had a vaginal delivery.  Have had more than two cesarean deliveries.  Are overdue.  Are pregnant with a very large baby.  Have a condition that causes high blood pressure (preeclampsia). Questions to ask your health care provider  Am I a good candidate for TOLAC?  What are my chances of a successful vaginal delivery?  Is my preferred birth location equipped for a TOLAC?  What are my pain management options during a TOLAC? Where to find more information  [employer] of [employer] and [employer]: www.acog.[place]: www.midwife.org Summary  Vaginal birth after cesarean delivery (VBAC) is giving birth vaginally after previously delivering a baby through a cesarean section (C-section).  VBAC may be a safe and appropriate option for you depending on your medical history and other risk factors. Talk with your health care provider about the options available to you, and the risks and benefits of each early in your  pregnancy.  TOLAC should be attempted in facilities where emergency cesarean section procedures can be performed. This information is not  intended to replace advice given to you by your health care provider. Make sure you discuss any questions you have with your health care provider. Document Released: 08/26/2011 Document Revised: 03/19/2017 Document Reviewed: 03/19/2017 Elsevier Interactive Patient Education  2019 [place] of Pregnancy  The first trimester of pregnancy is from week 1 until the end of week 13 (months 1 through 3). During this time, your baby will begin to develop inside you. At 6-8 weeks, the eyes and face are formed, and the heartbeat can be seen on ultrasound. At the end of 12 weeks, all the baby's organs are formed. Prenatal care is all the medical care you receive before the birth of your baby. Make sure you get good prenatal care and follow all of your doctor's instructions. Follow these instructions at home: Medicines  Take over-the-counter and prescription medicines only as told by your doctor. Some medicines are safe and some medicines are not safe during pregnancy.  Take a prenatal vitamin that contains at least 600 micrograms (mcg) of folic acid.  If you have trouble pooping (constipation), take medicine that will make your stool soft (stool softener) if your doctor approves. Eating and drinking   Eat regular, healthy meals.  Your doctor will tell you the amount of weight gain that is right for you.  Avoid raw meat and uncooked cheese.  If you feel sick to your stomach (nauseous) or throw up (vomit): ? Eat 4 or 5 small meals a day instead of 3 large meals. ? Try eating a few soda crackers. ? Drink liquids between meals instead of during meals.  To prevent constipation: ? Eat foods that are high in fiber, like fresh fruits and vegetables, whole grains, and beans. ? Drink enough fluids to keep your pee (urine) clear or pale  yellow. Activity  Exercise only as told by your doctor. Stop exercising if you have cramps or pain in your lower belly (abdomen) or low back.  Do not exercise if it is too hot, too humid, or if you are in a place of great height (high altitude).  Try to avoid standing for long periods of time. Move your legs often if you must stand in one place for a long time.  Avoid heavy lifting.  Wear low-heeled shoes. Sit and stand up straight.  You can have sex unless your doctor tells you not to. Relieving pain and discomfort  Wear a good support bra if your breasts are sore.  Take warm water baths (sitz baths) to soothe pain or discomfort caused by hemorrhoids. Use hemorrhoid cream if your doctor says it is okay.  Rest with your legs raised if you have leg cramps or low back pain.  If you have puffy, bulging veins (varicose veins) in your legs: ? Wear support hose or compression stockings as told by your doctor. ? Raise (elevate) your feet for 15 minutes, 3-4 times a day. ? Limit salt in your food. Prenatal care  Schedule your prenatal visits by the twelfth week of pregnancy.  Write down your questions. Take them to your prenatal visits.  Keep all your prenatal visits as told by your doctor. This is important. Safety  Wear your seat belt at all times when driving.  Make a list of emergency phone numbers. The list should include numbers for family, friends, the hospital, and police and fire departments. General instructions  Ask your doctor for a referral to a local prenatal class. Begin classes no later than at  the start of month 6 of your pregnancy.  Ask for help if you need counseling or if you need help with nutrition. Your doctor can give you advice or tell you where to go for help.  Do not use hot tubs, steam rooms, or saunas.  Do not douche or use tampons or scented sanitary pads.  Do not cross your legs for long periods of time.  Avoid all herbs and alcohol. Avoid  drugs that are not approved by your doctor.  Do not use any tobacco products, including cigarettes, chewing tobacco, and electronic cigarettes. If you need help quitting, ask your doctor. You may get counseling or other support to help you quit.  Avoid cat litter boxes and soil used by cats. These carry germs that can cause birth defects in the baby and can cause a loss of your baby (miscarriage) or stillbirth.  Visit your dentist. At home, brush your teeth with a soft toothbrush. Be gentle when you floss. Contact a doctor if:  You are dizzy.  You have mild cramps or pressure in your lower belly.  You have a nagging pain in your belly area.  You continue to feel sick to your stomach, you throw up, or you have watery poop (diarrhea).  You have a bad smelling fluid coming from your vagina.  You have pain when you pee (urinate).  You have increased puffiness (swelling) in your face, hands, legs, or ankles. Get help right away if:  You have a fever.  You are leaking fluid from your vagina.  You have spotting or bleeding from your vagina.  You have very bad belly cramping or pain.  You gain or lose weight rapidly.  You throw up blood. It may look like coffee grounds.  You are around people who have [place] measles, fifth disease, or chickenpox.  You have a very bad headache.  You have shortness of breath.  You have any kind of trauma, such as from a fall or a car accident. Summary  The first trimester of pregnancy is from week 1 until the end of week 13 (months 1 through 3).  To take care of yourself and your unborn baby, you will need to eat healthy meals, take medicines only if your doctor tells you to do so, and do activities that are safe for you and your baby.  Keep all follow-up visits as told by your doctor. This is important as your doctor will have to ensure that your baby is healthy and growing well. This information is not intended to replace advice given to you  by your health care provider. Make sure you discuss any questions you have with your health care provider. Document Released: 05/26/2008 Document Revised: 12/16/2016 Document Reviewed: 12/16/2016 Elsevier Interactive Patient Education  2019 [employer].

## 2019-01-19 ENCOUNTER — Ambulatory Visit: Payer: Self-pay | Admitting: Certified Nurse Midwife

## 2019-01-19 ENCOUNTER — Ambulatory Visit (INDEPENDENT_AMBULATORY_CARE_PROVIDER_SITE_OTHER): Payer: Self-pay

## 2019-01-19 VITALS — BP 99/67 | HR 76 | Ht 59.0 in | Wt 118.1 lb

## 2019-01-19 DIAGNOSIS — Z0283 Encounter for blood-alcohol and blood-drug test: Secondary | ICD-10-CM

## 2019-01-19 DIAGNOSIS — N912 Amenorrhea, unspecified: Secondary | ICD-10-CM

## 2019-01-19 DIAGNOSIS — Z113 Encounter for screening for infections with a predominantly sexual mode of transmission: Secondary | ICD-10-CM

## 2019-01-19 DIAGNOSIS — Z8759 Personal history of other complications of pregnancy, childbirth and the puerperium: Secondary | ICD-10-CM

## 2019-01-19 DIAGNOSIS — Z3687 Encounter for antenatal screening for uncertain dates: Secondary | ICD-10-CM

## 2019-01-19 DIAGNOSIS — Z3481 Encounter for supervision of other normal pregnancy, first trimester: Secondary | ICD-10-CM

## 2019-01-19 DIAGNOSIS — Z87828 Personal history of other (healed) physical injury and trauma: Secondary | ICD-10-CM

## 2019-01-19 NOTE — Progress Notes (Signed)
Tammy Ward presents for NOB nurse interview visit.  Pregnancy confirmation done at [facility].  G-2.  P-1.  LMP 11/19/2018.  EDD 08/26/2019. Dating scan done 01/19/2019 [facility] [redacted]w[redacted]d.  Pregnancy education material explained and given.  No cats in the home.  NOB labs ordered.  TSH/HgA1C not ordered.Body mass index is 23.85 kg/m.  Sickle cell not ordered. HIV and drug screen were explained and ordered.  PNV encouraged.  Genetic screening options discussed.  Genetic testing: Unsure.  Financial policy reviewed and FMLA form signed.  Patient to [facility] in 3 weeks for NOB physical.

## 2019-01-19 NOTE — Patient Instructions (Signed)
 How a Baby Grows During Pregnancy  Pregnancy begins when a female's sperm enters a female's egg (fertilization). Fertilization usually happens in one of the tubes (fallopian tubes) that connect the ovaries to the womb (uterus). The fertilized egg moves down the fallopian tube to the uterus. Once it reaches the uterus, it implants into the lining of the uterus and begins to grow. For the first 10 weeks, the fertilized egg is called an embryo. After 10 weeks, it is called a fetus. As the fetus continues to grow, it receives oxygen and nutrients through tissue (placenta) that grows to support the developing baby. The placenta is the life support system for the baby. It provides oxygen and nutrition and removes waste. Learning as much as you can about your pregnancy and how your baby is developing can help you enjoy the experience. It can also make you aware of when there might be a problem and when to ask questions. How long does a typical pregnancy last? A pregnancy usually lasts 280 days, or about 40 weeks. Pregnancy is divided into three periods of growth, also called trimesters:  First trimester: 0-12 weeks.  Second trimester: 13-27 weeks.  Third trimester: 28-40 weeks. The day when your baby is ready to be born (full term) is your estimated date of delivery. How does my baby develop month by month? First month  The fertilized egg attaches to the inside of the uterus.  Some cells will form the placenta. Others will form the fetus.  The arms, legs, brain, spinal cord, lungs, and heart begin to develop.  At the end of the first month, the heart begins to beat. Second month  The bones, inner ear, eyelids, hands, and feet form.  The genitals develop.  By the end of 8 weeks, all major organs are developing. Third month  All of the internal organs are forming.  Teeth develop below the gums.  Bones and muscles begin to grow. The spine can flex.  The skin is  transparent.  Fingernails and toenails begin to form.  Arms and legs continue to grow longer, and hands and feet develop.  The fetus is about 3 inches (7.6 cm) long. Fourth month  The placenta is completely formed.  The external sex organs, neck, outer ear, eyebrows, eyelids, and fingernails are formed.  The fetus can hear, swallow, and move its arms and legs.  The kidneys begin to produce urine.  The skin is covered with a white, waxy coating (vernix) and very fine hair (lanugo). Fifth month  The fetus moves around more and can be felt for the first time (quickening).  The fetus starts to sleep and wake up and may begin to suck its finger.  The nails grow to the end of the fingers.  The organ in the digestive system that makes bile (gallbladder) functions and helps to digest nutrients.  If your baby is a girl, eggs are present in her ovaries. If your baby is a boy, testicles start to move down into his scrotum. Sixth month  The lungs are formed.  The eyes open. The brain continues to develop.  Your baby has fingerprints and toe prints. Your baby's hair grows thicker.  At the end of the second trimester, the fetus is about 9 inches (22.9 cm) long. Seventh month  The fetus kicks and stretches.  The eyes are developed enough to sense changes in light.  The hands can make a grasping motion.  The fetus responds to sound. Eighth month    All organs and body systems are fully developed and functioning.  Bones harden, and taste buds develop. The fetus may hiccup.  Certain areas of the brain are still developing. The skull remains soft. Ninth month  The fetus gains about  lb (0.23 kg) each week.  The lungs are fully developed.  Patterns of sleep develop.  The fetus's head typically moves into a head-down position (vertex) in the uterus to prepare for birth.  The fetus weighs 6-9 lb (2.72-4.08 kg) and is 19-20 inches (48.26-50.8 cm) long. What can I do to have a  healthy pregnancy and help my baby develop? General instructions  Take prenatal vitamins as directed by your health care provider. These include vitamins such as folic acid, iron, calcium, and vitamin D. They are important for healthy development.  Take medicines only as directed by your health care provider. Read labels and ask a pharmacist or your health care provider whether over-the-counter medicines, supplements, and prescription drugs are safe to take during pregnancy.  Keep all follow-up visits as directed by your health care provider. This is important. Follow-up visits include prenatal care and screening tests. How do I know if my baby is developing well? At each prenatal visit, your health care provider will do several different tests to check on your health and keep track of your baby's development. These include:  Fundal height and position. ? Your health care provider will measure your growing belly from your pubic bone to the top of the uterus using a tape measure. ? Your health care provider will also feel your belly to determine your baby's position.  Heartbeat. ? An ultrasound in the first trimester can confirm pregnancy and show a heartbeat, depending on how far along you are. ? Your health care provider will check your baby's heart rate at every prenatal visit.  Second trimester ultrasound. ? This ultrasound checks your baby's development. It also may show your baby's gender. What should I do if I have concerns about my baby's development? Always talk with your health care provider about any concerns that you may have about your pregnancy and your baby. Summary  A pregnancy usually lasts 280 days, or about 40 weeks. Pregnancy is divided into three periods of growth, also called trimesters.  Your health care provider will monitor your baby's growth and development throughout your pregnancy.  Follow your health care provider's recommendations about taking prenatal  vitamins and medicines during your pregnancy.  Talk with your health care provider if you have any concerns about your pregnancy or your developing baby. This information is not intended to replace advice given to you by your health care provider. Make sure you discuss any questions you have with your health care provider. Document Released: 05/26/2008 Document Revised: 10/21/2017 Document Reviewed: 10/21/2017 Elsevier Interactive Patient Education  2019 [employer]  First Trimester of Pregnancy The first trimester of pregnancy is from week 1 until the end of week 13 (months 1 through 3). A week after a sperm fertilizes an egg, the egg will implant on the wall of the uterus. This embryo will begin to develop into a baby. Genes from you and your partner will form the baby. The female genes will determine whether the baby will be a boy or a girl. At 6-8 weeks, the eyes and face will be formed, and the heartbeat can be seen on ultrasound. At the end of 12 weeks, all the baby's organs will be formed. Now that you are pregnant, you will want to   do everything you can to have a healthy baby. Two of the most important things are to get good prenatal care and to follow your health care provider's instructions. Prenatal care is all the medical care you receive before the baby's birth. This care will help prevent, find, and treat any problems during the pregnancy and childbirth. Body changes during your first trimester Your body goes through many changes during pregnancy. The changes vary from woman to woman.  You may gain or lose a couple of pounds at first.  You may feel sick to your stomach (nauseous) and you may throw up (vomit). If the vomiting is uncontrollable, call your health care provider.  You may tire easily.  You may develop headaches that can be relieved by medicines. All medicines should be approved by your health care provider.  You may urinate more often. Painful urination may mean you  have a bladder infection.  You may develop heartburn as a result of your pregnancy.  You may develop constipation because certain hormones are causing the muscles that push stool through your intestines to slow down.  You may develop hemorrhoids or swollen veins (varicose veins).  Your breasts may begin to grow larger and become tender. Your nipples may stick out more, and the tissue that surrounds them (areola) may become darker.  Your gums may bleed and may be sensitive to brushing and flossing.  Dark spots or blotches (chloasma, mask of pregnancy) may develop on your face. This will likely fade after the baby is born.  Your menstrual periods will stop.  You may have a loss of appetite.  You may develop cravings for certain kinds of food.  You may have changes in your emotions from day to day, such as being excited to be pregnant or being concerned that something may go wrong with the pregnancy and baby.  You may have more vivid and strange dreams.  You may have changes in your hair. These can include thickening of your hair, rapid growth, and changes in texture. Some women also have hair loss during or after pregnancy, or hair that feels dry or thin. Your hair will most likely return to normal after your baby is born. What to expect at prenatal visits During a routine prenatal visit:  You will be weighed to make sure you and the baby are growing normally.  Your blood pressure will be taken.  Your abdomen will be measured to track your baby's growth.  The fetal heartbeat will be listened to between weeks 10 and 14 of your pregnancy.  Test results from any previous visits will be discussed. Your health care provider may ask you:  How you are feeling.  If you are feeling the baby move.  If you have had any abnormal symptoms, such as leaking fluid, bleeding, severe headaches, or abdominal cramping.  If you are using any tobacco products, including cigarettes, chewing  tobacco, and electronic cigarettes.  If you have any questions. Other tests that may be performed during your first trimester include:  Blood tests to find your blood type and to check for the presence of any previous infections. The tests will also be used to check for low iron levels (anemia) and protein on red blood cells (Rh antibodies). Depending on your risk factors, or if you previously had diabetes during pregnancy, you may have tests to check for high blood sugar that affects pregnant women (gestational diabetes).  Urine tests to check for infections, diabetes, or protein in the urine.    An ultrasound to confirm the proper growth and development of the baby.  Fetal screens for spinal cord problems (spina bifida) and Down syndrome.  HIV (human immunodeficiency virus) testing. Routine prenatal testing includes screening for HIV, unless you choose not to have this test.  You may need other tests to make sure you and the baby are doing well. Follow these instructions at home: Medicines  Follow your health care provider's instructions regarding medicine use. Specific medicines may be either safe or unsafe to take during pregnancy.  Take a prenatal vitamin that contains at least 600 micrograms (mcg) of folic acid.  If you develop constipation, try taking a stool softener if your health care provider approves. Eating and drinking   Eat a balanced diet that includes fresh fruits and vegetables, whole grains, good sources of protein such as meat, eggs, or tofu, and low-fat dairy. Your health care provider will help you determine the amount of weight gain that is right for you.  Avoid raw meat and uncooked cheese. These carry germs that can cause birth defects in the baby.  Eating four or five small meals rather than three large meals a day may help relieve nausea and vomiting. If you start to feel nauseous, eating a few soda crackers can be helpful. Drinking liquids between meals,  instead of during meals, also seems to help ease nausea and vomiting.  Limit foods that are high in fat and processed sugars, such as fried and sweet foods.  To prevent constipation: ? Eat foods that are high in fiber, such as fresh fruits and vegetables, whole grains, and beans. ? Drink enough fluid to keep your urine clear or pale yellow. Activity  Exercise only as directed by your health care provider. Most women can continue their usual exercise routine during pregnancy. Try to exercise for 30 minutes at least 5 days a week. Exercising will help you: ? Control your weight. ? Stay in shape. ? Be prepared for labor and delivery.  Experiencing pain or cramping in the lower abdomen or lower back is a good sign that you should stop exercising. Check with your health care provider before continuing with normal exercises.  Try to avoid standing for long periods of time. Move your legs often if you must stand in one place for a long time.  Avoid heavy lifting.  Wear low-heeled shoes and practice good posture.  You may continue to have sex unless your health care provider tells you not to. Relieving pain and discomfort  Wear a good support bra to relieve breast tenderness.  Take warm sitz baths to soothe any pain or discomfort caused by hemorrhoids. Use hemorrhoid cream if your health care provider approves.  Rest with your legs elevated if you have leg cramps or low back pain.  If you develop varicose veins in your legs, wear support hose. Elevate your feet for 15 minutes, 3-4 times a day. Limit salt in your diet. Prenatal care  Schedule your prenatal visits by the twelfth week of pregnancy. They are usually scheduled monthly at first, then more often in the last 2 months before delivery.  Write down your questions. Take them to your prenatal visits.  Keep all your prenatal visits as told by your health care provider. This is important. Safety  Wear your seat belt at all times  when driving.  Make a list of emergency phone numbers, including numbers for family, friends, the hospital, and police and fire departments. General instructions  Ask your health care   provider for a referral to a local prenatal education class. Begin classes no later than the beginning of month 6 of your pregnancy.  Ask for help if you have counseling or nutritional needs during pregnancy. Your health care provider can offer advice or refer you to specialists for help with various needs.  Do not use hot tubs, steam rooms, or saunas.  Do not douche or use tampons or scented sanitary pads.  Do not cross your legs for long periods of time.  Avoid cat litter boxes and soil used by cats. These carry germs that can cause birth defects in the baby and possibly loss of the fetus by miscarriage or stillbirth.  Avoid all smoking, herbs, alcohol, and medicines not prescribed by your health care provider. Chemicals in these products affect the formation and growth of the baby.  Do not use any products that contain nicotine or tobacco, such as cigarettes and e-cigarettes. If you need help quitting, ask your health care provider. You may receive counseling support and other resources to help you quit.  Schedule a dentist appointment. At home, brush your teeth with a soft toothbrush and be gentle when you floss. Contact a health care provider if:  You have dizziness.  You have mild pelvic cramps, pelvic pressure, or nagging pain in the abdominal area.  You have persistent nausea, vomiting, or diarrhea.  You have a bad smelling vaginal discharge.  You have pain when you urinate.  You notice increased swelling in your face, hands, legs, or ankles.  You are exposed to fifth disease or chickenpox.  You are exposed to German measles (rubella) and have never had it. Get help right away if:  You have a fever.  You are leaking fluid from your vagina.  You have spotting or bleeding from your  vagina.  You have severe abdominal cramping or pain.  You have rapid weight gain or loss.  You vomit blood or material that looks like coffee grounds.  You develop a severe headache.  You have shortness of breath.  You have any kind of trauma, such as from a fall or a car accident. Summary  The first trimester of pregnancy is from week 1 until the end of week 13 (months 1 through 3).  Your body goes through many changes during pregnancy. The changes vary from woman to woman.  You will have routine prenatal visits. During those visits, your health care provider will examine you, discuss any test results you may have, and talk with you about how you are feeling. This information is not intended to replace advice given to you by your health care provider. Make sure you discuss any questions you have with your health care provider. Document Released: 12/02/2001 Document Revised: 11/19/2016 Document Reviewed: 11/19/2016 Elsevier Interactive Patient Education  2019 [employer]   Commonly Asked Questions During Pregnancy  Cats: A parasite can be excreted in cat feces.  To avoid exposure you need to have another person empty the little box.  If you must empty the litter box you will need to wear gloves.  Wash your hands after handling your cat.  This parasite can also be found in raw or undercooked meat so this should also be avoided.  Colds, Sore Throats, Flu: Please check your medication sheet to see what you can take for symptoms.  If your symptoms are unrelieved by these medications please call the office.  Dental Work: Most any dental work your dentist recommends is permitted.  X-rays should only   be taken during the first trimester if absolutely necessary.  Your abdomen should be shielded with a lead apron during all x-rays.  Please notify your provider prior to receiving any x-rays.  Novocaine is fine; gas is not recommended.  If your dentist requires a note from us prior to dental  work please call the office and we will provide one for you.  Exercise: Exercise is an important part of staying healthy during your pregnancy.  You may continue most exercises you were accustomed to prior to pregnancy.  Later in your pregnancy you will most likely notice you have difficulty with activities requiring balance like riding a bicycle.  It is important that you listen to your body and avoid activities that put you at a higher risk of falling.  Adequate rest and staying well hydrated are a must!  If you have questions about the safety of specific activities ask your provider.    Exposure to Children with illness: Try to avoid obvious exposure; report any symptoms to us when noted,  If you have chicken pos, red measles or mumps, you should be immune to these diseases.   Please do not take any vaccines while pregnant unless you have checked with your OB provider.  Fetal Movement: After 28 weeks we recommend you do "kick counts" twice daily.  Lie or sit down in a calm quiet environment and count your baby movements "kicks".  You should feel your baby at least 10 times per hour.  If you have not felt 10 kicks within the first hour get up, walk around and have something sweet to eat or drink then repeat for an additional hour.  If count remains less than 10 per hour notify your provider.  Fumigating: Follow your pest control agent's advice as to how long to stay out of your home.  Ventilate the area well before re-entering.  Hemorrhoids:   Most over-the-counter preparations can be used during pregnancy.  Check your medication to see what is safe to use.  It is important to use a stool softener or fiber in your diet and to drink lots of liquids.  If hemorrhoids seem to be getting worse please call the office.   Hot Tubs:  Hot tubs Jacuzzis and saunas are not recommended while pregnant.  These increase your internal body temperature and should be avoided.  Intercourse:  Sexual intercourse is safe  during pregnancy as long as you are comfortable, unless otherwise advised by your provider.  Spotting may occur after intercourse; report any bright red bleeding that is heavier than spotting.  Labor:  If you know that you are in labor, please go to the hospital.  If you are unsure, please call the office and let us help you decide what to do.  Lifting, straining, etc:  If your job requires heavy lifting or straining please check with your provider for any limitations.  Generally, you should not lift items heavier than that you can lift simply with your hands and arms (no back muscles)  Painting:  Paint fumes do not harm your pregnancy, but may make you ill and should be avoided if possible.  Latex or water based paints have less odor than oils.  Use adequate ventilation while painting.  Permanents & Hair Color:  Chemicals in hair dyes are not recommended as they cause increase hair dryness which can increase hair loss during pregnancy.  " Highlighting" and permanents are allowed.  Dye may be absorbed differently and permanents may not hold   as well during pregnancy.  Sunbathing:  Use a sunscreen, as skin burns easily during pregnancy.  Drink plenty of fluids; avoid over heating.  Tanning Beds:  Because their possible side effects are still unknown, tanning beds are not recommended.  Ultrasound Scans:  Routine ultrasounds are performed at approximately 20 weeks.  You will be able to see your baby's general anatomy an if you would like to know the gender this can usually be determined as well.  If it is questionable when you conceived you may also receive an ultrasound early in your pregnancy for dating purposes.  Otherwise ultrasound exams are not routinely performed unless there is a medical necessity.  Although you can request a scan we ask that you pay for it when conducted because insurance does not cover " patient request" scans.  Work: If your pregnancy proceeds without complications you may  work until your due date, unless your physician or employer advises otherwise.  Round Ligament Pain/Pelvic Discomfort:  Sharp, shooting pains not associated with bleeding are fairly common, usually occurring in the second trimester of pregnancy.  They tend to be worse when standing up or when you remain standing for long periods of time.  These are the result of pressure of certain pelvic ligaments called "round ligaments".  Rest, Tylenol and heat seem to be the most effective relief.  As the womb and fetus grow, they rise out of the pelvis and the discomfort improves.  Please notify the office if your pain seems different than that described.  It may represent a more serious condition.   

## 2019-01-20 LAB — URINALYSIS, ROUTINE W REFLEX MICROSCOPIC
Bilirubin, UA: NEGATIVE
Glucose, UA: NEGATIVE
Ketones, UA: NEGATIVE
Leukocytes, UA: NEGATIVE
Nitrite, UA: NEGATIVE
PH UA: 7.5 (ref 5.0–7.5)
Protein, UA: NEGATIVE
RBC, UA: NEGATIVE
Specific Gravity, UA: 1.028 (ref 1.005–1.030)
Urobilinogen, Ur: 1 mg/dL (ref 0.2–1.0)

## 2019-01-20 LAB — MONITOR DRUG PROFILE 14(MW)
Amphetamine Scrn, Ur: NEGATIVE ng/mL
BARBITURATE SCREEN URINE: NEGATIVE ng/mL
BENZODIAZEPINE SCREEN, URINE: NEGATIVE ng/mL
Buprenorphine, Urine: NEGATIVE ng/mL
CANNABINOIDS UR QL SCN: NEGATIVE ng/mL
Cocaine (Metab) Scrn, Ur: NEGATIVE ng/mL
Creatinine(Crt), U: 141.3 mg/dL (ref 20.0–300.0)
Fentanyl, Urine: NEGATIVE pg/mL
Meperidine Screen, Urine: NEGATIVE ng/mL
Methadone Screen, Urine: NEGATIVE ng/mL
OXYCODONE+OXYMORPHONE UR QL SCN: NEGATIVE ng/mL
Opiate Scrn, Ur: NEGATIVE ng/mL
PHENCYCLIDINE QUANTITATIVE URINE: NEGATIVE ng/mL
Ph of Urine: 7.2 (ref 4.5–8.9)
Propoxyphene Scrn, Ur: NEGATIVE ng/mL
SPECIFIC GRAVITY: 1.027
Tramadol Screen, Urine: NEGATIVE ng/mL

## 2019-01-20 LAB — RUBELLA SCREEN: Rubella Antibodies, IGG: 2.68 index (ref >0.99)

## 2019-01-20 LAB — HIV ANTIBODY (ROUTINE TESTING W REFLEX): HIV Screen 4th Generation wRfx: NONREACTIVE

## 2019-01-20 LAB — ANTIBODY SCREEN: ANTIBODY SCREEN: NEGATIVE

## 2019-01-20 LAB — HGB SOLU + RFLX FRAC: Sickle Solubility Test - HGBRFX: NEGATIVE

## 2019-01-20 LAB — VARICELLA ZOSTER ANTIBODY, IGG: Varicella zoster IgG: 506 index (ref >165)

## 2019-01-20 LAB — RPR: RPR Ser Ql: NONREACTIVE

## 2019-01-20 LAB — ABO AND RH: Rh Factor: POSITIVE

## 2019-01-20 LAB — NICOTINE SCREEN, URINE: Cotinine Ql Scrn, Ur: NEGATIVE ng/mL

## 2019-01-20 LAB — HEPATITIS B SURFACE ANTIGEN: Hepatitis B Surface Ag: NEGATIVE

## 2019-01-21 LAB — GC/CHLAMYDIA PROBE AMP
Chlamydia trachomatis, NAA: NEGATIVE
Neisseria gonorrhoeae by PCR: NEGATIVE

## 2019-01-22 LAB — URINE CULTURE, OB REFLEX

## 2019-01-22 LAB — CULTURE, OB URINE

## 2019-02-07 NOTE — Patient Instructions (Addendum)
WE WOULD LOVE TO HEAR FROM YOU!!!!   Thank you Tammy Ward for visiting [facility].  Providing our patients with the best experience possible is really important to [place], and we hope that you felt that on your recent visit. The most valuable feedback we get comes from YOU!!    If you receive a survey please take a couple of minutes to let us know how we did.Thank you for continuing to trust [place] with your care.   [facility]   WHAT OB PATIENTS CAN EXPECT   Confirmation of pregnancy and ultrasound ordered if medically indicated-[redacted] weeks gestation  New OB (NOB) intake with nurse and New OB (NOB) labs- [redacted] weeks gestation  New OB (NOB) physical examination with provider- 11/[redacted] weeks gestation  Flu vaccine-[redacted] weeks gestation  Anatomy scan-[redacted] weeks gestation  Glucose tolerance test, blood work to test for anemia, T-dap vaccine-[redacted] weeks gestation  Vaginal swabs/cultures-STD/Group B strep-[redacted] weeks gestation  Appointments every 4 weeks until 28 weeks  Every 2 weeks from 28 weeks until 36 weeks  Weekly visits from 36 weeks until delivery    Cesarean Delivery Cesarean birth, or cesarean delivery, is the surgical delivery of a baby through an incision in the abdomen and the uterus. This may be referred to as a C-section. This procedure may be scheduled ahead of time, or it may be done in an emergency situation. Tell a health care provider about:  Any allergies you have.  All medicines you are taking, including vitamins, herbs, eye drops, creams, and over-the-counter medicines.  Any problems you or family members have had with anesthetic medicines.  Any blood disorders you have.  Any surgeries you have had.  Any medical conditions you have.  Whether you or any members of your family have a history of deep vein thrombosis (DVT) or pulmonary embolism (PE). What are the risks? Generally, this is a safe procedure. However, problems may  occur, including:  Infection.  Bleeding.  Allergic reactions to medicines.  Damage to other structures or organs.  Blood clots.  Injury to your baby. What happens before the procedure? General instructions  Follow instructions from your health care provider about eating or drinking restrictions.  If you know that you are going to have a cesarean delivery, do not shave your pubic area. Shaving before the procedure may increase your risk of infection.  Plan to have someone take you home from the hospital.  Ask your health care provider what steps will be taken to prevent infection. These may include: ? Removing hair at the surgery site. ? Washing skin with a germ-killing soap. ? Taking antibiotic medicine.  Depending on the reason for your cesarean delivery, you may have a physical exam or additional testing, such as an ultrasound.  You may have your blood or urine tested. Questions for your health care provider  Ask your health care provider about: ? Changing or stopping your regular medicines. This is especially important if you are taking diabetes medicines or blood thinners. ? Your pain management plan. This is especially important if you plan to breastfeed your baby. ? How long you will be in the hospital after the procedure. ? Any concerns you may have about receiving blood products, if you need them during the procedure. ? Cord blood banking, if you plan to collect your baby's umbilical cord blood.  You may also want to ask your health care provider: ? Whether you will be able to hold or breastfeed your baby  while you are still in the operating room. ? Whether your baby can stay with you immediately after the procedure and during your recovery. ? Whether a family member or a person of your choice can go with you into the operating room and stay with you during the procedure, immediately after the procedure, and during your recovery. What happens during the  procedure?   An IV will be inserted into one of your veins.  Fluid and medicines, such as antibiotics, will be given before the surgery.  Fetal monitors will be placed on your abdomen to check your baby's heart rate.  You may be given a special warming gown to wear to keep your temperature stable.  A catheter may be inserted into your bladder through your urethra. This drains your urine during the procedure.  You may be given one or more of the following: ? A medicine to numb the area (local anesthetic). ? A medicine to make you fall asleep (general anesthetic). ? A medicine (regional anesthetic) that is injected into your back or through a small thin tube placed in your back (spinal anesthetic or epidural anesthetic). This numbs everything below the injection site and allows you to stay awake during your procedure. If this makes you feel nauseous, tell your health care provider. Medicines will be available to help reduce any nausea you may feel.  An incision will be made in your abdomen, and then in your uterus.  If you are awake during your procedure, you may feel tugging and pulling in your abdomen, but you should not feel pain. If you feel pain, tell your health care provider immediately.  Your baby will be removed from your uterus. You may feel more pressure or pushing while this happens.  Immediately after birth, your baby will be dried and kept warm. You may be able to hold and breastfeed your baby.  The umbilical cord may be clamped and cut during this time. This usually occurs after waiting a period of 1-2 minutes after delivery.  Your placenta will be removed from your uterus.  Your incisions will be closed with stitches (sutures). Staples, skin glue, or adhesive strips may also be applied to the incision in your abdomen.  Bandages (dressings) may be placed over the incision in your abdomen. The procedure may vary among health care providers and hospitals. What happens  after the procedure?  Your blood pressure, heart rate, breathing rate, and blood oxygen level will be monitored until you are discharged from the hospital.  You may continue to receive fluids and medicines through an IV.  You will have some pain. Medicines will be available to help control your pain.  To help prevent blood clots: ? You may be given medicines. ? You may have to wear compression stockings or devices. ? You will be encouraged to walk around when you are able.  Hospital staff will encourage and support bonding with your baby. Your hospital may have you and your baby to stay in the same room (rooming in) during your hospital stay to encourage successful bonding and breastfeeding.  You may be encouraged to cough and breathe deeply often. This helps to prevent lung problems.  If you have a catheter draining your urine, it will be removed as soon as possible after your procedure. Summary  Cesarean birth, or cesarean delivery, is the surgical delivery of a baby through an incision in the abdomen and the uterus.  Follow instructions from your health care provider about eating or drinking  restrictions before the procedure.  You will have some pain after the procedure. Medicines will be available to help control your pain.  Hospital staff will encourage and support bonding with your baby after the procedure. Your hospital may have you and your baby to stay in the same room (rooming in) during your hospital stay to encourage successful bonding and breastfeeding. This information is not intended to replace advice given to you by your health care provider. Make sure you discuss any questions you have with your health care provider. Document Released: 12/08/2005 Document Revised: 06/14/2018 Document Reviewed: 06/14/2018 Elsevier Interactive Patient Education  2019 [employer].  How a Baby Grows During Pregnancy  Pregnancy begins when a female's sperm enters a female's egg  (fertilization). Fertilization usually happens in one of the tubes (fallopian tubes) that connect the ovaries to the womb (uterus). The fertilized egg moves down the fallopian tube to the uterus. Once it reaches the uterus, it implants into the lining of the uterus and begins to grow. For the first 10 weeks, the fertilized egg is called an embryo. After 10 weeks, it is called a fetus. As the fetus continues to grow, it receives oxygen and nutrients through tissue (placenta) that grows to support the developing baby. The placenta is the life support system for the baby. It provides oxygen and nutrition and removes waste. Learning as much as you can about your pregnancy and how your baby is developing can help you enjoy the experience. It can also make you aware of when there might be a problem and when to ask questions. How long does a typical pregnancy last? A pregnancy usually lasts 280 days, or about 40 weeks. Pregnancy is divided into three periods of growth, also called trimesters:  First trimester: 0-12 weeks.  Second trimester: 13-27 weeks.  Third trimester: 28-40 weeks. The day when your baby is ready to be born (full term) is your estimated date of delivery. How does my baby develop month by month? First month  The fertilized egg attaches to the inside of the uterus.  Some cells will form the placenta. Others will form the fetus.  The arms, legs, brain, spinal cord, lungs, and heart begin to develop.  At the end of the first month, the heart begins to beat. Second month  The bones, inner ear, eyelids, hands, and feet form.  The genitals develop.  By the end of 8 weeks, all major organs are developing. Third month  All of the internal organs are forming.  Teeth develop below the gums.  Bones and muscles begin to grow. The spine can flex.  The skin is transparent.  Fingernails and toenails begin to form.  Arms and legs continue to grow longer, and hands and feet  develop.  The fetus is about 3 inches (7.6 cm) long. Fourth month  The placenta is completely formed.  The external sex organs, neck, outer ear, eyebrows, eyelids, and fingernails are formed.  The fetus can hear, swallow, and move its arms and legs.  The kidneys begin to produce urine.  The skin is covered with a white, waxy coating (vernix) and very fine hair (lanugo). Fifth month  The fetus moves around more and can be felt for the first time (quickening).  The fetus starts to sleep and wake up and may begin to suck its finger.  The nails grow to the end of the fingers.  The organ in the digestive system that makes bile (gallbladder) functions and helps to digest nutrients.  If your baby is a girl, eggs are present in her ovaries. If your baby is a boy, testicles start to move down into his scrotum. Sixth month  The lungs are formed.  The eyes open. The brain continues to develop.  Your baby has fingerprints and toe prints. Your baby's hair grows thicker.  At the end of the second trimester, the fetus is about 9 inches (22.9 cm) long. Seventh month  The fetus kicks and stretches.  The eyes are developed enough to sense changes in light.  The hands can make a grasping motion.  The fetus responds to sound. Eighth month  All organs and body systems are fully developed and functioning.  Bones harden, and taste buds develop. The fetus may hiccup.  Certain areas of the brain are still developing. The skull remains soft. Ninth month  The fetus gains about  lb (0.23 kg) each week.  The lungs are fully developed.  Patterns of sleep develop.  The fetus's head typically moves into a head-down position (vertex) in the uterus to prepare for birth.  The fetus weighs 6-9 lb (2.72-4.08 kg) and is 19-20 inches (48.26-50.8 cm) long. What can I do to have a healthy pregnancy and help my baby develop? General instructions  Take prenatal vitamins as directed by your  health care provider. These include vitamins such as folic acid, iron, calcium, and vitamin D. They are important for healthy development.  Take medicines only as directed by your health care provider. Read labels and ask a pharmacist or your health care provider whether over-the-counter medicines, supplements, and prescription drugs are safe to take during pregnancy.  Keep all follow-up visits as directed by your health care provider. This is important. Follow-up visits include prenatal care and screening tests. How do I know if my baby is developing well? At each prenatal visit, your health care provider will do several different tests to check on your health and keep track of your baby's development. These include:  Fundal height and position. ? Your health care provider will measure your growing belly from your pubic bone to the top of the uterus using a tape measure. ? Your health care provider will also feel your belly to determine your baby's position.  Heartbeat. ? An ultrasound in the first trimester can confirm pregnancy and show a heartbeat, depending on how far along you are. ? Your health care provider will check your baby's heart rate at every prenatal visit.  Second trimester ultrasound. ? This ultrasound checks your baby's development. It also may show your baby's gender. What should I do if I have concerns about my baby's development? Always talk with your health care provider about any concerns that you may have about your pregnancy and your baby. Summary  A pregnancy usually lasts 280 days, or about 40 weeks. Pregnancy is divided into three periods of growth, also called trimesters.  Your health care provider will monitor your baby's growth and development throughout your pregnancy.  Follow your health care provider's recommendations about taking prenatal vitamins and medicines during your pregnancy.  Talk with your health care provider if you have any concerns about  your pregnancy or your developing baby. This information is not intended to replace advice given to you by your health care provider. Make sure you discuss any questions you have with your health care provider. Document Released: 05/26/2008 Document Revised: 10/21/2017 Document Reviewed: 10/21/2017 Elsevier Interactive Patient Education  2019 [employer]. Second Trimester of Pregnancy  The second trimester  is from week 14 through week 27 (month 4 through 6). This is often the time in pregnancy that you feel your best. Often times, morning sickness has lessened or quit. You may have more energy, and you may get hungry more often. Your unborn baby is growing rapidly. At the end of the sixth month, he or she is about 9 inches long and weighs about 1 pounds. You will likely feel the baby move between 18 and 20 weeks of pregnancy. Follow these instructions at home: Medicines  Take over-the-counter and prescription medicines only as told by your doctor. Some medicines are safe and some medicines are not safe during pregnancy.  Take a prenatal vitamin that contains at least 600 micrograms (mcg) of folic acid.  If you have trouble pooping (constipation), take medicine that will make your stool soft (stool softener) if your doctor approves. Eating and drinking   Eat regular, healthy meals.  Avoid raw meat and uncooked cheese.  If you get low calcium from the food you eat, talk to your doctor about taking a daily calcium supplement.  Avoid foods that are high in fat and sugars, such as fried and sweet foods.  If you feel sick to your stomach (nauseous) or throw up (vomit): ? Eat 4 or 5 small meals a day instead of 3 large meals. ? Try eating a few soda crackers. ? Drink liquids between meals instead of during meals.  To prevent constipation: ? Eat foods that are high in fiber, like fresh fruits and vegetables, whole grains, and beans. ? Drink enough fluids to keep your pee (urine) clear or  pale yellow. Activity  Exercise only as told by your doctor. Stop exercising if you start to have cramps.  Do not exercise if it is too hot, too humid, or if you are in a place of great height (high altitude).  Avoid heavy lifting.  Wear low-heeled shoes. Sit and stand up straight.  You can continue to have sex unless your doctor tells you not to. Relieving pain and discomfort  Wear a good support bra if your breasts are tender.  Take warm water baths (sitz baths) to soothe pain or discomfort caused by hemorrhoids. Use hemorrhoid cream if your doctor approves.  Rest with your legs raised if you have leg cramps or low back pain.  If you develop puffy, bulging veins (varicose veins) in your legs: ? Wear support hose or compression stockings as told by your doctor. ? Raise (elevate) your feet for 15 minutes, 3-4 times a day. ? Limit salt in your food. Prenatal care  Write down your questions. Take them to your prenatal visits.  Keep all your prenatal visits as told by your doctor. This is important. Safety  Wear your seat belt when driving.  Make a list of emergency phone numbers, including numbers for family, friends, the hospital, and police and fire departments. General instructions  Ask your doctor about the right foods to eat or for help finding a counselor, if you need these services.  Ask your doctor about local prenatal classes. Begin classes before month 6 of your pregnancy.  Do not use hot tubs, steam rooms, or saunas.  Do not douche or use tampons or scented sanitary pads.  Do not cross your legs for long periods of time.  Visit your dentist if you have not done so. Use a soft toothbrush to brush your teeth. Floss gently.  Avoid all smoking, herbs, and alcohol. Avoid drugs that are not approved  by your doctor.  Do not use any products that contain nicotine or tobacco, such as cigarettes and e-cigarettes. If you need help quitting, ask your doctor.  Avoid  cat litter boxes and soil used by cats. These carry germs that can cause birth defects in the baby and can cause a loss of your baby (miscarriage) or stillbirth. Contact a doctor if:  You have mild cramps or pressure in your lower belly.  You have pain when you pee (urinate).  You have bad smelling fluid coming from your vagina.  You continue to feel sick to your stomach (nauseous), throw up (vomit), or have watery poop (diarrhea).  You have a nagging pain in your belly area.  You feel dizzy. Get help right away if:  You have a fever.  You are leaking fluid from your vagina.  You have spotting or bleeding from your vagina.  You have severe belly cramping or pain.  You lose or gain weight rapidly.  You have trouble catching your breath and have chest pain.  You notice sudden or extreme puffiness (swelling) of your face, hands, ankles, feet, or legs.  You have not felt the baby move in over an hour.  You have severe headaches that do not go away when you take medicine.  You have trouble seeing. Summary  The second trimester is from week 14 through week 27 (months 4 through 6). This is often the time in pregnancy that you feel your best.  To take care of yourself and your unborn baby, you will need to eat healthy meals, take medicines only if your doctor tells you to do so, and do activities that are safe for you and your baby.  Call your doctor if you get sick or if you notice anything unusual about your pregnancy. Also, call your doctor if you need help with the right food to eat, or if you want to know what activities are safe for you. This information is not intended to replace advice given to you by your health care provider. Make sure you discuss any questions you have with your health care provider. Document Released: 03/04/2010 Document Revised: 01/13/2017 Document Reviewed: 01/13/2017 Elsevier Interactive Patient Education  2019 [employer].  Breastfeeding During  Pregnancy Deciding whether to continue breastfeeding during a pregnancy is an Paramedic. Breastfeeding during pregnancy is generally not risky. Your nursing child may naturally stop breastfeeding (wean) during your pregnancy. If you have problems during pregnancy, you may be advised to stop breastfeeding. Work with your health care provider to help decide if breastfeeding during pregnancy is right for you. What should I consider when deciding whether to breastfeed during pregnancy? When deciding whether to continue breastfeeding while you are pregnant, you may want to consider:  The age of your nursing child and his or her physical and emotional needs.  Any health concerns related to your pregnancy. It may not be safe to continue breastfeeding if you have certain problems, such as: ? Uterine pain or bleeding. ? A history of preterm labor and delivery. ? Problems gaining weight or losing weight during pregnancy. ? A history of cervical insufficiency. This is a condition in which the cervix begins to thin and soften before your due date.  Whether you have any problems associated with breastfeeding. Some common problems experienced when breastfeeding during pregnancy include: ? Nipple tenderness and breast soreness. ? Nausea. ? Discomfort while breastfeeding due to the growing belly. ? Fatigue. ? Reduced milk supply. This may mean having  fewer feedings a day. ? Changes in how your milk tastes. ? Uterine contractions. Follow these instructions at home:   Keep an open mind about how your breastfeeding experience will be. Avoid setting rigid expectations for yourself. Your needs and your nursing child's needs are likely to change as your pregnancy progresses.  Make sure that you are gaining a healthy amount of weight and eating enough calories.  Eat a healthy diet that includes fresh fruits and vegetables, whole grains, lean meat, fish, eggs, beans, nuts, and seeds, and low-fat dairy  products.  Drink plenty of fluids so your urine is clear or pale yellow.  Work with your health care provider and your child's health care provider as needed.  Keep track of your nursing child's weight. If your nursing baby is younger than twelve months, the normal decrease of your milk supply that happens during pregnancy could keep your baby from getting all the milk he or she needs.  Keep track of your nursing child's daily wet diapers and bowel movements to make sure he or she is staying hydrated. Your baby should have 6-8 wet diapers and at least 3 stools each day.  Talk to your health care provider or lactation specialist before starting any new medications or supplements. This is to make sure that they are safe for both pregnancy and breastfeeding. Where to find more information  [employer]: http://www.llli.org Contact a health care provider if:  Your breasts become large and painful (engorged).  Your nursing child is urinating or having bowel movements less often than normal. Summary  Breastfeeding during pregnancy is generally not risky. However, if you have problems during pregnancy, you may be advised to stop breastfeeding.  During pregnancy, your milk supply naturally decreases. Your nursing child may wean himself or herself naturally during your pregnancy.  Keep track of your nursing child's weight, wet diapers, and stools to make sure that he or she is getting enough milk.  Keep an open mind about how your breastfeeding experience will be. Avoid setting rigid expectations for yourself. This information is not intended to replace advice given to you by your health care provider. Make sure you discuss any questions you have with your health care provider. Document Released: 04/07/2005 Document Revised: 12/09/2016 Document Reviewed: 12/09/2016 Elsevier Interactive Patient Education  2019 [employer].

## 2019-02-08 ENCOUNTER — Other Ambulatory Visit (HOSPITAL_COMMUNITY)
Admission: RE | Admit: 2019-02-08 | Discharge: 2019-02-08 | Disposition: A | Discharge status: Home / Self Care | Payer: Self-pay | Source: Ambulatory Visit | Attending: Certified Nurse Midwife | Admitting: Certified Nurse Midwife

## 2019-02-08 ENCOUNTER — Ambulatory Visit (INDEPENDENT_AMBULATORY_CARE_PROVIDER_SITE_OTHER): Payer: Medicaid Other | Admitting: Certified Nurse Midwife

## 2019-02-08 ENCOUNTER — Encounter: Payer: Self-pay | Admitting: Certified Nurse Midwife

## 2019-02-08 VITALS — BP 92/58 | HR 90 | Wt 115.1 lb

## 2019-02-08 DIAGNOSIS — Z3482 Encounter for supervision of other normal pregnancy, second trimester: Secondary | ICD-10-CM

## 2019-02-08 DIAGNOSIS — Z3A11 11 weeks gestation of pregnancy: Secondary | ICD-10-CM

## 2019-02-08 DIAGNOSIS — Z87828 Personal history of other (healed) physical injury and trauma: Secondary | ICD-10-CM

## 2019-02-08 DIAGNOSIS — O34219 Maternal care for unspecified type scar from previous cesarean delivery: Secondary | ICD-10-CM

## 2019-02-08 DIAGNOSIS — O09291 Supervision of pregnancy with other poor reproductive or obstetric history, first trimester: Secondary | ICD-10-CM

## 2019-02-08 DIAGNOSIS — Z124 Encounter for screening for malignant neoplasm of cervix: Secondary | ICD-10-CM

## 2019-02-08 DIAGNOSIS — Z98891 History of uterine scar from previous surgery: Secondary | ICD-10-CM

## 2019-02-08 DIAGNOSIS — Z8759 Personal history of other complications of pregnancy, childbirth and the puerperium: Secondary | ICD-10-CM

## 2019-02-08 LAB — POCT URINALYSIS DIPSTICK OB
Bilirubin, UA: NEGATIVE
Glucose, UA: NEGATIVE
Ketones, UA: NEGATIVE
LEUKOCYTES UA: NEGATIVE
Nitrite, UA: NEGATIVE
POC,PROTEIN,UA: NEGATIVE
RBC UA: NEGATIVE
Spec Grav, UA: 1.01 (ref 1.010–1.025)
Urobilinogen, UA: 0.2 E.U./dL
pH, UA: 8.5 — AB (ref 5.0–8.0)

## 2019-02-08 NOTE — Progress Notes (Signed)
NEW OB HISTORY AND PHYSICAL  SUBJECTIVE:       Tammy Ward is a 23 y.o. G23P1001 female, Patient's last menstrual period was 11/19/2018 (exact date)., Estimated Date of Delivery: 08/26/19, [redacted]w[redacted]d, presents today for establishment of Prenatal Care.  No questions or concerns. Desires genetic screening.   Denies difficulty breathing or respiratory distress, chest pain, abdominal pain, vaginal bleeding, dysuria, and leg pain or swelling.   Gynecologic History  Patient's last menstrual period was 11/19/2018 (exact date).   Contraception: none  Last Pap: due.   Obstetric History  OB History  Gravida Para Term Preterm AB Living  2 1 1  0 0 1  SAB TAB Ectopic Multiple Live Births  0 0 0 0 1    # Outcome Date GA Lbr Len/2nd Weight Sex Delivery Anes PTL Lv  2 Current           1 Term 11/18/17 [redacted]w[redacted]d  7 lb 5.8 oz (3.34 kg) M CS-LTranv Gen  LIV    Obstetric Comments  Menstrual age: 62    Age 1st Pregnancy:NA    Past Medical History:  Diagnosis Date  . Kidney stones    kidney stones, cysts  . Left genital labial abscess     Past Surgical History:  Procedure Laterality Date  . ABSCESS DRAINAGE  02-02-15   Labial   . CESAREAN SECTION N/A 11/18/2017   Procedure: CESAREAN SECTION;  Surgeon: Herold Harms, MD;  Location: [facility] ORS;  Service: Obstetrics;  Laterality: N/A;  . KIDNEY STONE SURGERY  2014    Current Outpatient Medications on File Prior to Visit  Medication Sig Dispense Refill  . Prenatal Vit-Fe Fumarate-FA (PRENATAL VITAMINS) 28-0.8 MG TABS Take 1 tablet by mouth daily. 30 tablet 11   No current facility-administered medications on file prior to visit.     No Known Allergies  Social History   Socioeconomic History  . Marital status: Single    Spouse name: Not on file  . Number of children: Not on file  . Years of education: Not on file  . Highest education level: Not on file  Occupational History  . Not on file  Social Needs  . Financial  resource strain: Not on file  . Food insecurity:    Worry: Not on file    Inability: Not on file  . Transportation needs:    Medical: Not on file    Non-medical: Not on file  Tobacco Use  . Smoking status: Never Smoker  . Smokeless tobacco: Never Used  Substance and Sexual Activity  . Alcohol use: No    Alcohol/week: 0.0 standard drinks  . Drug use: No  . Sexual activity: Yes    Birth control/protection: None  Lifestyle  . Physical activity:    Days per week: Not on file    Minutes per session: Not on file  . Stress: Not on file  Relationships  . Social connections:    Talks on phone: Not on file    Gets together: Not on file    Attends religious service: Not on file    Active member of club or organization: Not on file    Attends meetings of clubs or organizations: Not on file    Relationship status: Not on file  . Intimate partner violence:    Fear of current or ex partner: Not on file    Emotionally abused: Not on file    Physically abused: Not on file    Forced sexual activity: Not  on file  Other Topics Concern  . Not on file  Social History Narrative  . Not on file    Family History  Problem Relation Age of Onset  . Asthma Sister   . Thyroid disease Sister   . Cancer Neg Hx   . Diabetes Neg Hx   . Heart disease Neg Hx   . Breast cancer Neg Hx   . Ovarian cancer Neg Hx   . Colon cancer Neg Hx     The following portions of the patient's history were reviewed and updated as appropriate: allergies, current medications, past OB history, past medical history, past surgical history, past family history, past social history, and problem list.  Review of Systems  ROS negative except as noted above. Information obtained from patient.   OBJECTIVE:  BP (!) 92/58   Pulse 90   Wt 115 lb 1.6 oz (52.2 kg)   LMP 11/19/2018 (Exact Date)   BMI 23.25 kg/m   Initial Physical Exam (New OB)  GENERAL APPEARANCE: alert, well appearing, in no apparent distress  HEAD:  normocephalic, atraumatic  MOUTH: mucous membranes moist, pharynx normal without lesions  THYROID: no thyromegaly or masses present  BREASTS: no masses noted, no significant tenderness, no palpable axillary nodes, no skin changes, lactating  LUNGS: clear to auscultation, no wheezes, rales or rhonchi, symmetric air entry  HEART: regular rate and rhythm, no murmurs  ABDOMEN: soft, nontender, nondistended, no abnormal masses, no epigastric pain and FHT present  EXTREMITIES: no redness or tenderness in the calves or thighs, no edema  SKIN: normal coloration and turgor, no rashes, professional tattoos present  LYMPH NODES: no adenopathy palpable  NEUROLOGIC: alert, oriented, normal speech, no focal findings or movement disorder noted  PELVIC EXAM EXTERNAL GENITALIA: normal appearing vulva with no masses, tenderness or lesions VAGINA: no abnormal discharge or lesions CERVIX: no lesions or cervical motion tenderness and pap collected  ASSESSMENT: Normal pregnancy Rh positive History uterine rupture History cesarean section-plans repeat Desires genetic screening-[place] orders Breastfeeding during Pregnancy Screening cervical cancer-Pap collected  PLAN: Prenatal care New OB counseling: The patient has been given an overview regarding routine prenatal care. Recommendations regarding diet, weight gain, and exercise in pregnancy were given. Prenatal testing, optional genetic testing, and ultrasound use in pregnancy were reviewed.  Benefits of Breast Feeding were discussed. The patient is encouraged to consider nursing her baby post partum.See orders   Gunnar Bulla, CNM [facility], [facility] 02/08/19 10:53 AM

## 2019-02-08 NOTE — Progress Notes (Signed)
Pt is present for NOB PE. Pt stated that she is doing well. No problems.

## 2019-02-09 LAB — CBC
Hematocrit: 36.3 % (ref 34.0–46.6)
Hemoglobin: 12.4 g/dL (ref 11.1–15.9)
MCH: 30.2 pg (ref 26.6–33.0)
MCHC: 34.2 g/dL (ref 31.5–35.7)
MCV: 88 fL (ref 79–97)
Platelets: 236 10*3/uL (ref 150–450)
RBC: 4.11 x10E6/uL (ref 3.77–5.28)
RDW: 12.5 % (ref 11.7–15.4)
WBC: 6.7 10*3/uL (ref 3.4–10.8)

## 2019-02-10 LAB — CYTOLOGY - PAP: Diagnosis: NEGATIVE

## 2019-03-08 ENCOUNTER — Other Ambulatory Visit: Payer: Self-pay

## 2019-03-08 ENCOUNTER — Ambulatory Visit (INDEPENDENT_AMBULATORY_CARE_PROVIDER_SITE_OTHER): Payer: Self-pay | Admitting: Certified Nurse Midwife

## 2019-03-08 VITALS — BP 91/53 | HR 75 | Wt 115.1 lb

## 2019-03-08 DIAGNOSIS — Z3482 Encounter for supervision of other normal pregnancy, second trimester: Secondary | ICD-10-CM

## 2019-03-08 LAB — POCT URINALYSIS DIPSTICK OB
Bilirubin, UA: NEGATIVE
Blood, UA: NEGATIVE
Glucose, UA: NEGATIVE
Ketones, UA: NEGATIVE
Leukocytes, UA: NEGATIVE
Nitrite, UA: NEGATIVE
POC,PROTEIN,UA: NEGATIVE
Spec Grav, UA: 1.02 (ref 1.010–1.025)
Urobilinogen, UA: 0.2 E.U./dL
pH, UA: 7 (ref 5.0–8.0)

## 2019-03-08 NOTE — Addendum Note (Signed)
Addended by: Mechele Claude on: 03/08/2019 09:51 AM   Modules accepted: Orders

## 2019-03-08 NOTE — Progress Notes (Signed)
ROB doing well. No complaints. Discussed anatomy u/s at [facility]. Pt verbalizes and agrees to plan. Follow up 4 wks.   Doreene Burke, CNM

## 2019-03-08 NOTE — Patient Instructions (Signed)

## 2019-04-04 ENCOUNTER — Telehealth: Payer: Self-pay

## 2019-04-04 NOTE — Telephone Encounter (Signed)
Coronavirus (COVID-19) Are you at risk?  Are you at risk for the Coronavirus (COVID-19)?  To be considered HIGH RISK for Coronavirus (COVID-19), you have to meet the following criteria:  . Traveled to [place], [place], [place], [place] or [place]; or in the [place] to [place], [place], [place], or [place]; and have fever, cough, and shortness of breath within the last 2 weeks of travel OR . Been in close contact with a person diagnosed with COVID-19 within the last 2 weeks and have fever, cough, and shortness of breath . IF YOU DO NOT MEET THESE CRITERIA, YOU ARE CONSIDERED LOW RISK FOR COVID-19.  What to do if you are HIGH RISK for COVID-19?  . If you are having a medical emergency, call 911. . Seek medical care right away. Before you go to a doctor's office, urgent care or emergency department, call ahead and tell them about your recent travel, contact with someone diagnosed with COVID-19, and your symptoms. You should receive instructions from your physician's office regarding next steps of care.  . When you arrive at healthcare provider, tell the healthcare staff immediately you have returned from visiting [place], [place], [place], [place] or [place]; or traveled in the [place] to [place], [place], [place], or [place]; in the last two weeks or you have been in close contact with a person diagnosed with COVID-19 in the last 2 weeks.   . Tell the health care staff about your symptoms: fever, cough and shortness of breath. . After you have been seen by a medical provider, you will be either: o Tested for (COVID-19) and discharged home on quarantine except to seek medical care if symptoms worsen, and asked to  - Stay home and avoid contact with others until you get your results (4-5 days)  - Avoid travel on public transportation if possible (such as bus, train, or airplane) or o Sent to the Emergency Department by EMS for evaluation, COVID-19 testing, and possible  admission depending on your condition and test results.  What to do if you are LOW RISK for COVID-19?  Reduce your risk of any infection by using the same precautions used for avoiding the common cold or flu:  . Wash your hands often with soap and warm water for at least 20 seconds.  If soap and water are not readily available, use an alcohol-based hand sanitizer with at least 60% alcohol.  . If coughing or sneezing, cover your mouth and nose by coughing or sneezing into the elbow areas of your shirt or coat, into a tissue or into your sleeve (not your hands). . Avoid shaking hands with others and consider head nods or verbal greetings only. . Avoid touching your eyes, nose, or mouth with unwashed hands.  . Avoid close contact with people who are sick. . Avoid places or events with large numbers of people in one location, like concerts or sporting events. . Carefully consider travel plans you have or are making. . If you are planning any travel outside or inside the [place], visit the CDC's Travelers' Health webpage for the latest health notices. . If you have some symptoms but not all symptoms, continue to monitor at home and seek medical attention if your symptoms worsen. . If you are having a medical emergency, call 911.   ADDITIONAL HEALTHCARE OPTIONS FOR PATIENTS  [place] [facility] / e-Visit: [email or web address]         [facility]: [phone]  [place]   [facility]: [phone]                   [facility]: [phone]   Prescreened- neg. cm  

## 2019-04-05 ENCOUNTER — Telehealth: Payer: Self-pay

## 2019-04-05 ENCOUNTER — Other Ambulatory Visit: Payer: Self-pay

## 2019-04-05 ENCOUNTER — Ambulatory Visit (INDEPENDENT_AMBULATORY_CARE_PROVIDER_SITE_OTHER): Payer: Self-pay

## 2019-04-05 DIAGNOSIS — Z3482 Encounter for supervision of other normal pregnancy, second trimester: Secondary | ICD-10-CM

## 2019-04-05 DIAGNOSIS — Z363 Encounter for antenatal screening for malformations: Secondary | ICD-10-CM

## 2019-04-05 NOTE — Telephone Encounter (Signed)
Coronavirus (COVID-19) Are you at risk?  Are you at risk for the Coronavirus (COVID-19)?  To be considered HIGH RISK for Coronavirus (COVID-19), you have to meet the following criteria:  . Traveled to [place], [place], [place], [place] or [place]; or in the [place] to [place], [place], [place], or [place]; and have fever, cough, and shortness of breath within the last 2 weeks of travel OR . Been in close contact with a person diagnosed with COVID-19 within the last 2 weeks and have fever, cough, and shortness of breath . IF YOU DO NOT MEET THESE CRITERIA, YOU ARE CONSIDERED LOW RISK FOR COVID-19.  What to do if you are HIGH RISK for COVID-19?  . If you are having a medical emergency, call 911. . Seek medical care right away. Before you go to a doctor's office, urgent care or emergency department, call ahead and tell them about your recent travel, contact with someone diagnosed with COVID-19, and your symptoms. You should receive instructions from your physician's office regarding next steps of care.  . When you arrive at healthcare provider, tell the healthcare staff immediately you have returned from visiting [place], [place], [place], [place] or [place]; or traveled in the [place] to [place], [place], [place], or [place]; in the last two weeks or you have been in close contact with a person diagnosed with COVID-19 in the last 2 weeks.   . Tell the health care staff about your symptoms: fever, cough and shortness of breath. . After you have been seen by a medical provider, you will be either: o Tested for (COVID-19) and discharged home on quarantine except to seek medical care if symptoms worsen, and asked to  - Stay home and avoid contact with others until you get your results (4-5 days)  - Avoid travel on public transportation if possible (such as bus, train, or airplane) or o Sent to the Emergency Department by EMS for evaluation, COVID-19 testing, and possible  admission depending on your condition and test results.  What to do if you are LOW RISK for COVID-19?  Reduce your risk of any infection by using the same precautions used for avoiding the common cold or flu:  . Wash your hands often with soap and warm water for at least 20 seconds.  If soap and water are not readily available, use an alcohol-based hand sanitizer with at least 60% alcohol.  . If coughing or sneezing, cover your mouth and nose by coughing or sneezing into the elbow areas of your shirt or coat, into a tissue or into your sleeve (not your hands). . Avoid shaking hands with others and consider head nods or verbal greetings only. . Avoid touching your eyes, nose, or mouth with unwashed hands.  . Avoid close contact with people who are Tammy Ward. . Avoid places or events with large numbers of people in one location, like concerts or sporting events. . Carefully consider travel plans you have or are making. . If you are planning any travel outside or inside the [place], visit the CDC's Travelers' Health webpage for the latest health notices. . If you have some symptoms but not all symptoms, continue to monitor at home and seek medical attention if your symptoms worsen. . If you are having a medical emergency, call 911. 04/05/19 SCREENING NEG SLS  ADDITIONAL HEALTHCARE OPTIONS FOR PATIENTS  [place] [facility] / e-Visit: [email or web address]         [facility]: [phone]    [facility]: [phone]                   [facility]: [phone]  

## 2019-04-06 ENCOUNTER — Ambulatory Visit (INDEPENDENT_AMBULATORY_CARE_PROVIDER_SITE_OTHER): Payer: Medicaid Other | Admitting: Certified Nurse Midwife

## 2019-04-06 VITALS — BP 99/65 | HR 83 | Wt 119.1 lb

## 2019-04-06 DIAGNOSIS — Z3492 Encounter for supervision of normal pregnancy, unspecified, second trimester: Secondary | ICD-10-CM

## 2019-04-06 DIAGNOSIS — Z3A19 19 weeks gestation of pregnancy: Secondary | ICD-10-CM

## 2019-04-06 LAB — POCT URINALYSIS DIPSTICK OB
Bilirubin, UA: NEGATIVE
Blood, UA: NEGATIVE
Glucose, UA: NEGATIVE
Ketones, UA: NEGATIVE
Nitrite, UA: NEGATIVE
POC,PROTEIN,UA: NEGATIVE
Spec Grav, UA: 1.015 (ref 1.010–1.025)
Urobilinogen, UA: 0.2 E.U./dL
pH, UA: 7 (ref 5.0–8.0)

## 2019-04-06 NOTE — Progress Notes (Signed)
ROB- pt is doing well 

## 2019-04-06 NOTE — Progress Notes (Signed)
Doing well. Feels good movement. Has some back pain. Reviewed self help measures. Reviewed u/s results. Discussed glucose screen at next visit. Follow up 5 wks.   Doreene Burke, CNM   Patient Name: Tammy Ward DOB: [birth year] MRN: [id number]  ULTRASOUND REPORT  Location: [facility] Date of Service: 04/05/2019   Indications:Anatomy Ultrasound Findings:  Mason Jim intrauterine pregnancy is visualized with FHR at 152 BPM. Biometrics give an (U/S) Gestational age of [redacted]w[redacted]d and an (U/S) EDD of 08/27/2019; this correlates with the clinically established Estimated Date of Delivery: 08/26/19.   Fetal presentation is Variable.  EFW: 279 g (10 oz). Placenta: posterior. Grade: 0 AFI: subjectively normal.  Anatomic survey is complete and normal; Gender - female.    Right Ovary is normal in appearance. Left Ovary is normal appearance. Survey of the adnexa demonstrates no adnexal masses. There is no free peritoneal fluid in the cul de sac.  Impression: 1. [redacted]w[redacted]d Viable Singleton Intrauterine pregnancy by U/S. 2. (U/S) EDD is consistent with Clinically established Estimated Date of Delivery: 08/26/19 . 3. Normal Anatomy Scan  Recommendations: 1.Clinical correlation with the patient's History and Physical Exam.

## 2019-04-06 NOTE — Patient Instructions (Signed)

## 2019-04-08 ENCOUNTER — Ambulatory Visit: Payer: Self-pay

## 2019-05-11 ENCOUNTER — Telehealth: Payer: Self-pay

## 2019-05-11 NOTE — Telephone Encounter (Signed)
Coronavirus (COVID-19) Are you at risk?  Are you at risk for the Coronavirus (COVID-19)?  To be considered HIGH RISK for Coronavirus (COVID-19), you have to meet the following criteria:  . Traveled to [place], [place], [place], [place] or [place]; or in the [place] to [place], [place], [place], or [place]; and have fever, cough, and shortness of breath within the last 2 weeks of travel OR . Been in close contact with a person diagnosed with COVID-19 within the last 2 weeks and have fever, cough, and shortness of breath . IF YOU DO NOT MEET THESE CRITERIA, YOU ARE CONSIDERED LOW RISK FOR COVID-19.  What to do if you are HIGH RISK for COVID-19?  . If you are having a medical emergency, call 911. . Seek medical care right away. Before you go to a doctor's office, urgent care or emergency department, call ahead and tell them about your recent travel, contact with someone diagnosed with COVID-19, and your symptoms. You should receive instructions from your physician's office regarding next steps of care.  . When you arrive at healthcare provider, tell the healthcare staff immediately you have returned from visiting [place], [place], [place], [place] or [place]; or traveled in the [place] to [place], [place], [place], or [place]; in the last two weeks or you have been in close contact with a person diagnosed with COVID-19 in the last 2 weeks.   . Tell the health care staff about your symptoms: fever, cough and shortness of breath. . After you have been seen by a medical provider, you will be either: o Tested for (COVID-19) and discharged home on quarantine except to seek medical care if symptoms worsen, and asked to  - Stay home and avoid contact with others until you get your results (4-5 days)  - Avoid travel on public transportation if possible (such as bus, train, or airplane) or o Sent to the Emergency Department by EMS for evaluation, COVID-19 testing, and possible  admission depending on your condition and test results.  What to do if you are LOW RISK for COVID-19?  Reduce your risk of any infection by using the same precautions used for avoiding the common cold or flu:  . Wash your hands often with soap and warm water for at least 20 seconds.  If soap and water are not readily available, use an alcohol-based hand sanitizer with at least 60% alcohol.  . If coughing or sneezing, cover your mouth and nose by coughing or sneezing into the elbow areas of your shirt or coat, into a tissue or into your sleeve (not your hands). . Avoid shaking hands with others and consider head nods or verbal greetings only. . Avoid touching your eyes, nose, or mouth with unwashed hands.  . Avoid close contact with people who are sick. . Avoid places or events with large numbers of people in one location, like concerts or sporting events. . Carefully consider travel plans you have or are making. . If you are planning any travel outside or inside the [place], visit the CDC's Travelers' Health webpage for the latest health notices. . If you have some symptoms but not all symptoms, continue to monitor at home and seek medical attention if your symptoms worsen. . If you are having a medical emergency, call 911.   ADDITIONAL HEALTHCARE OPTIONS FOR PATIENTS  [place] [facility] / e-Visit: [email or web address]         [facility]: [phone]  [place]   [facility]: [phone]                   [facility]: [phone]   Pre-screen negative, DM.   

## 2019-05-12 ENCOUNTER — Other Ambulatory Visit: Payer: Self-pay

## 2019-05-12 ENCOUNTER — Ambulatory Visit (INDEPENDENT_AMBULATORY_CARE_PROVIDER_SITE_OTHER): Payer: Medicaid Other | Admitting: Certified Nurse Midwife

## 2019-05-12 VITALS — BP 101/65 | HR 101 | Wt 130.4 lb

## 2019-05-12 DIAGNOSIS — Z3492 Encounter for supervision of normal pregnancy, unspecified, second trimester: Secondary | ICD-10-CM

## 2019-05-12 LAB — POCT URINALYSIS DIPSTICK OB
Bilirubin, UA: NEGATIVE
Blood, UA: NEGATIVE
Glucose, UA: NEGATIVE
Ketones, UA: NEGATIVE
Nitrite, UA: NEGATIVE
Spec Grav, UA: 1.01 (ref 1.010–1.025)
Urobilinogen, UA: 0.2 E.U./dL
pH, UA: 8 (ref 5.0–8.0)

## 2019-05-12 NOTE — Progress Notes (Signed)
ROB-Patient c/o intermittent back pain and lower pelvic pressure when laying down, using support pillow with relief.

## 2019-05-12 NOTE — Patient Instructions (Signed)
Back Pain in Pregnancy Back pain during pregnancy is common. Back pain may be caused by several factors that are related to changes during your pregnancy. Follow these instructions at home: Managing pain, stiffness, and swelling      If directed, for sudden (acute) back pain, put ice on the painful area. ? Put ice in a plastic bag. ? Place a towel between your skin and the bag. ? Leave the ice on for 20 minutes, 2-3 times per day.  If directed, apply heat to the affected area before you exercise. Use the heat source that your health care provider recommends, such as a moist heat pack or a heating pad. ? Place a towel between your skin and the heat source. ? Leave the heat on for 20-30 minutes. ? Remove the heat if your skin turns bright red. This is especially important if you are unable to feel pain, heat, or cold. You may have a greater risk of getting burned.  If directed, massage the affected area. Activity  Exercise as told by your health care provider. Gentle exercise is the best way to prevent or manage back pain.  Listen to your body when lifting. If lifting hurts, ask for help or bend your knees. This uses your leg muscles instead of your back muscles.  Squat down when picking up something from the floor. Do not bend over.  Only use bed rest for short periods as told by your health care provider. Bed rest should only be used for the most severe episodes of back pain. Standing, sitting, and lying down  Do not stand in one place for long periods of time.  Use good posture when sitting. Make sure your head rests over your shoulders and is not hanging forward. Use a pillow on your lower back if necessary.  Try sleeping on your side, preferably the left side, with a pregnancy support pillow or 1-2 regular pillows between your legs. ? If you have back pain after a night's rest, your bed may be too soft. ? A firm mattress may provide more support for your back during  pregnancy. General instructions  Do not wear high heels.  Eat a healthy diet. Try to gain weight within your health care provider's recommendations.  Use a maternity girdle, elastic sling, or back brace as told by your health care provider.  Take over-the-counter and prescription medicines only as told by your health care provider.  Work with a physical therapist or massage therapist to find ways to manage back pain. Acupuncture or massage therapy may be helpful.  Keep all follow-up visits as told by your health care provider. This is important. Contact a health care provider if:  Your back pain interferes with your daily activities.  You have increasing pain in other parts of your body. Get help right away if:  You develop numbness, tingling, weakness, or problems with the use of your arms or legs.  You develop severe back pain that is not controlled with medicine.  You have a change in bowel or bladder control.  You develop shortness of breath, dizziness, or you faint.  You develop nausea, vomiting, or sweating.  You have back pain that is a rhythmic, cramping pain similar to labor pains. Labor pain is usually 1-2 minutes apart, lasts for about 1 minute, and involves a bearing down feeling or pressure in your pelvis.  You have back pain and your water breaks or you have vaginal bleeding.  You have back pain or numbness   that travels down your leg.  Your back pain developed after you fell.  You develop pain on one side of your back.  You see blood in your urine.  You develop skin blisters in the area of your back pain. Summary  Back pain may be caused by several factors that are related to changes during your pregnancy.  Follow instructions as told by your health care provider for managing pain, stiffness, and swelling.  Exercise as told by your health care provider. Gentle exercise is the best way to prevent or manage back pain.  Take over-the-counter and  prescription medicines only as told by your health care provider.  Keep all follow-up visits as told by your health care provider. This is important. This information is not intended to replace advice given to you by your health care provider. Make sure you discuss any questions you have with your health care provider. Document Released: 03/18/2006 Document Revised: 05/26/2018 Document Reviewed: 05/26/2018 Elsevier Interactive Patient Education  2019 [employer] Round Ligament Pain  The round ligament is a cord of muscle and tissue that helps support the uterus. It can become a source of pain during pregnancy if it becomes stretched or twisted as the baby grows. The pain usually begins in the second trimester (13-28 weeks) of pregnancy, and it can come and go until the baby is delivered. It is not a serious problem, and it does not cause harm to the baby. Round ligament pain is usually a short, sharp, and pinching pain, but it can also be a dull, lingering, and aching pain. The pain is felt in the lower side of the abdomen or in the groin. It usually starts deep in the groin and moves up to the outside of the hip area. The pain may occur when you:  Suddenly change position, such as quickly going from a sitting to standing position.  Roll over in bed.  Cough or sneeze.  Do physical activity. Follow these instructions at home:   Watch your condition for any changes.  When the pain starts, relax. Then try any of these methods to help with the pain: ? Sitting down. ? Flexing your knees up to your abdomen. ? Lying on your side with one pillow under your abdomen and another pillow between your legs. ? Sitting in a warm bath for 15-20 minutes or until the pain goes away.  Take over-the-counter and prescription medicines only as told by your health care provider.  Move slowly when you sit down or stand up.  Avoid long walks if they cause pain.  Stop or reduce your physical activities if  they cause pain.  Keep all follow-up visits as told by your health care provider. This is important. Contact a health care provider if:  Your pain does not go away with treatment.  You feel pain in your back that you did not have before.  Your medicine is not helping. Get help right away if:  You have a fever or chills.  You develop uterine contractions.  You have vaginal bleeding.  You have nausea or vomiting.  You have diarrhea.  You have pain when you urinate. Summary  Round ligament pain is felt in the lower abdomen or groin. It is usually a short, sharp, and pinching pain. It can also be a dull, lingering, and aching pain.  This pain usually begins in the second trimester (13-28 weeks). It occurs because the uterus is stretching with the growing baby, and it is not harmful   to the baby.  You may notice the pain when you suddenly change position, when you cough or sneeze, or during physical activity.  Relaxing, flexing your knees to your abdomen, lying on one side, or taking a warm bath may help to get rid of the pain.  Get help from your health care provider if the pain does not go away or if you have vaginal bleeding, nausea, vomiting, diarrhea, or painful urination. This information is not intended to replace advice given to you by your health care provider. Make sure you discuss any questions you have with your health care provider. Document Released: 09/16/2008 Document Revised: 05/26/2018 Document Reviewed: 05/26/2018 Elsevier Interactive Patient Education  2019 [employer] Inc[employer]  

## 2019-05-14 NOTE — Progress Notes (Signed)
ROB-Reports intermittent back and lower pelvic pressure. Discussed home treatment measures including use of abdominal support. Encouraged masking, social distancing, and hand hygiene with return to work. Advised repeat surgical birth should occur at 37 weeks due to history of uterine rupture with last labor, verbalized understanding. Prefers Friday, August 14. Anticipatory guidance regarding course of prenatal care. Reviewed red flag symptoms and when to call. RTC x 4 weeks for 28 week labs and ROB or sooner if needed.

## 2019-06-08 ENCOUNTER — Other Ambulatory Visit: Payer: Medicaid Other

## 2019-06-08 ENCOUNTER — Ambulatory Visit (INDEPENDENT_AMBULATORY_CARE_PROVIDER_SITE_OTHER): Payer: Medicaid Other | Admitting: Certified Nurse Midwife

## 2019-06-08 ENCOUNTER — Other Ambulatory Visit: Payer: Self-pay

## 2019-06-08 ENCOUNTER — Encounter: Payer: Self-pay | Admitting: Certified Nurse Midwife

## 2019-06-08 VITALS — BP 97/65 | HR 96 | Wt 138.0 lb

## 2019-06-08 DIAGNOSIS — Z131 Encounter for screening for diabetes mellitus: Secondary | ICD-10-CM

## 2019-06-08 DIAGNOSIS — Z3493 Encounter for supervision of normal pregnancy, unspecified, third trimester: Secondary | ICD-10-CM

## 2019-06-08 DIAGNOSIS — Z130 Encounter for screening for diseases of the blood and blood-forming organs and certain disorders involving the immune mechanism: Secondary | ICD-10-CM

## 2019-06-08 DIAGNOSIS — Z3492 Encounter for supervision of normal pregnancy, unspecified, second trimester: Secondary | ICD-10-CM

## 2019-06-08 DIAGNOSIS — Z23 Encounter for immunization: Secondary | ICD-10-CM

## 2019-06-08 LAB — POCT URINALYSIS DIPSTICK OB
Bilirubin, UA: NEGATIVE
Blood, UA: NEGATIVE
Glucose, UA: NEGATIVE
Ketones, UA: NEGATIVE
Leukocytes, UA: NEGATIVE
Nitrite, UA: NEGATIVE
POC,PROTEIN,UA: NEGATIVE
Spec Grav, UA: 1.01 (ref 1.010–1.025)
Urobilinogen, UA: 0.2 E.U./dL
pH, UA: 6.5 (ref 5.0–8.0)

## 2019-06-08 MED ORDER — TETANUS-DIPHTH-ACELL PERTUSSIS 5-2.5-18.5 LF-MCG/0.5 IM SUSP
0.5000 mL | Freq: Once | INTRAMUSCULAR | Status: AC
  Administered 2019-06-08: 0.5 mL via INTRAMUSCULAR

## 2019-06-08 NOTE — Progress Notes (Signed)
ROB doing well. Glucose screen/Tdap/RPR/CBC /BTC today. Pt request letter for work to be out due to risks in pregnancy with Covid . Discussed PTO and potential to be without pay once PTO runs out. She verbalizes and agrees to plan. Information given on [facility] after baby, plans pill. Discussed visit with MD at 32 wks for c/s . She verbalizes and agrees to Plan of care. Follow 2 wks with Melody. 4 wks with MD for repeat c/s counseling/scheduling @ 37 wks.   Philip Aspen, CNM

## 2019-06-08 NOTE — Patient Instructions (Signed)
Glucose Tolerance Test During Pregnancy Why am I having this test? The glucose tolerance test (GTT) is done to check how your body processes sugar (glucose). This is one of several tests used to diagnose diabetes that develops during pregnancy (gestational diabetes mellitus). Gestational diabetes is a temporary form of diabetes that some women develop during pregnancy. It usually occurs during the second trimester of pregnancy and goes away after delivery. Testing (screening) for gestational diabetes usually occurs between 24 and 28 weeks of pregnancy. You may have the GTT test after having a 1-hour glucose screening test if the results from that test indicate that you may have gestational diabetes. You may also have this test if:  You have a history of gestational diabetes.  You have a history of giving birth to very large babies or have experienced repeated fetal loss (stillbirth).  You have signs and symptoms of diabetes, such as: ? Changes in your vision. ? Tingling or numbness in your hands or feet. ? Changes in hunger, thirst, and urination that are not otherwise explained by your pregnancy. What is being tested? This test measures the amount of glucose in your blood at different times during a period of 3 hours. This indicates how well your body is able to process glucose. What kind of sample is taken?  Blood samples are required for this test. They are usually collected by inserting a needle into a blood vessel. How do I prepare for this test?  For 3 days before your test, eat normally. Have plenty of carbohydrate-rich foods.  Follow instructions from your health care provider about: ? Eating or drinking restrictions on the day of the test. You may be asked to not eat or drink anything other than water (fast) starting 8-10 hours before the test. ? Changing or stopping your regular medicines. Some medicines may interfere with this test. Tell a health care provider about:  All  medicines you are taking, including vitamins, herbs, eye drops, creams, and over-the-counter medicines.  Any blood disorders you have.  Any surgeries you have had.  Any medical conditions you have. What happens during the test? First, your blood glucose will be measured. This is referred to as your fasting blood glucose, since you fasted before the test. Then, you will drink a glucose solution that contains a certain amount of glucose. Your blood glucose will be measured again 1, 2, and 3 hours after drinking the solution. This test takes about 3 hours to complete. You will need to stay at the testing location during this time. During the testing period:  Do not eat or drink anything other than the glucose solution.  Do not exercise.  Do not use any products that contain nicotine or tobacco, such as cigarettes and e-cigarettes. If you need help stopping, ask your health care provider. The testing procedure may vary among health care providers and hospitals. How are the results reported? Your results will be reported as milligrams of glucose per deciliter of blood (mg/dL) or millimoles per liter (mmol/L). Your health care provider will compare your results to normal ranges that were established after testing a large group of people (reference ranges). Reference ranges may vary among labs and hospitals. For this test, common reference ranges are:  Fasting: less than 95-105 mg/dL (5.3-5.8 mmol/L).  1 hour after drinking glucose: less than 180-190 mg/dL (10.0-10.5 mmol/L).  2 hours after drinking glucose: less than 155-165 mg/dL (8.6-9.2 mmol/L).  3 hours after drinking glucose: 140-145 mg/dL (7.8-8.1 mmol/L). What do the   results mean? Results within reference ranges are considered normal, meaning that your glucose levels are well-controlled. If two or more of your blood glucose levels are high, you may be diagnosed with gestational diabetes. If only one level is high, your health care  provider may suggest repeat testing or other tests to confirm a diagnosis. Talk with your health care provider about what your results mean. Questions to ask your health care provider Ask your health care provider, or the department that is doing the test:  When will my results be ready?  How will I get my results?  What are my treatment options?  What other tests do I need?  What are my next steps? Summary  The glucose tolerance test (GTT) is one of several tests used to diagnose diabetes that develops during pregnancy (gestational diabetes mellitus). Gestational diabetes is a temporary form of diabetes that some women develop during pregnancy.  You may have the GTT test after having a 1-hour glucose screening test if the results from that test indicate that you may have gestational diabetes. You may also have this test if you have any symptoms or risk factors for gestational diabetes.  Talk with your health care provider about what your results mean. This information is not intended to replace advice given to you by your health care provider. Make sure you discuss any questions you have with your health care provider. Document Released: 06/08/2012 Document Revised: 07/20/2017 Document Reviewed: 07/20/2017 Elsevier Interactive Patient Education  2019 Elsevier Inc.  

## 2019-06-09 LAB — CBC
Hematocrit: 29.2 % — ABNORMAL LOW (ref 34.0–46.6)
Hemoglobin: 9.7 g/dL — ABNORMAL LOW (ref 11.1–15.9)
MCH: 30.3 pg (ref 26.6–33.0)
MCHC: 33.2 g/dL (ref 31.5–35.7)
MCV: 91 fL (ref 79–97)
Platelets: 195 10*3/uL (ref 150–450)
RBC: 3.2 x10E6/uL — ABNORMAL LOW (ref 3.77–5.28)
RDW: 12.2 % (ref 11.7–15.4)
WBC: 14.9 10*3/uL — ABNORMAL HIGH (ref 3.4–10.8)

## 2019-06-09 LAB — GLUCOSE, 1 HOUR GESTATIONAL: Gestational Diabetes Screen: 85 mg/dL (ref 65–139)

## 2019-06-09 LAB — RPR: RPR Ser Ql: NONREACTIVE

## 2019-06-10 ENCOUNTER — Encounter: Payer: Medicaid Other | Admitting: Certified Nurse Midwife

## 2019-06-10 ENCOUNTER — Encounter: Payer: Self-pay | Admitting: Certified Nurse Midwife

## 2019-06-10 ENCOUNTER — Other Ambulatory Visit: Payer: Medicaid Other

## 2019-06-10 DIAGNOSIS — O99019 Anemia complicating pregnancy, unspecified trimester: Secondary | ICD-10-CM | POA: Insufficient documentation

## 2019-06-21 ENCOUNTER — Telehealth: Payer: Self-pay

## 2019-06-21 NOTE — Telephone Encounter (Signed)
Coronavirus (COVID-19) Are you at risk?  Are you at risk for the Coronavirus (COVID-19)?  To be considered HIGH RISK for Coronavirus (COVID-19), you have to meet the following criteria:  . Traveled to [place], [place], [place], [place] or [place]; or in the [place] to [place], [place], [place], or [place]; and have fever, cough, and shortness of breath within the last 2 weeks of travel OR . Been in close contact with a person diagnosed with COVID-19 within the last 2 weeks and have fever, cough, and shortness of breath . IF YOU DO NOT MEET THESE CRITERIA, YOU ARE CONSIDERED LOW RISK FOR COVID-19.  What to do if you are HIGH RISK for COVID-19?  . If you are having a medical emergency, call 911. . Seek medical care right away. Before you go to a doctor's office, urgent care or emergency department, call ahead and tell them about your recent travel, contact with someone diagnosed with COVID-19, and your symptoms. You should receive instructions from your physician's office regarding next steps of care.  . When you arrive at healthcare provider, tell the healthcare staff immediately you have returned from visiting [place], [place], [place], [place] or [place]; or traveled in the [place] to [place], [place], [place], or [place]; in the last two weeks or you have been in close contact with a person diagnosed with COVID-19 in the last 2 weeks.   . Tell the health care staff about your symptoms: fever, cough and shortness of breath. . After you have been seen by a medical provider, you will be either: o Tested for (COVID-19) and discharged home on quarantine except to seek medical care if symptoms worsen, and asked to  - Stay home and avoid contact with others until you get your results (4-5 days)  - Avoid travel on public transportation if possible (such as bus, train, or airplane) or o Sent to the Emergency Department by EMS for evaluation, COVID-19 testing, and possible  admission depending on your condition and test results.  What to do if you are LOW RISK for COVID-19?  Reduce your risk of any infection by using the same precautions used for avoiding the common cold or flu:  . Wash your hands often with soap and warm water for at least 20 seconds.  If soap and water are not readily available, use an alcohol-based hand sanitizer with at least 60% alcohol.  . If coughing or sneezing, cover your mouth and nose by coughing or sneezing into the elbow areas of your shirt or coat, into a tissue or into your sleeve (not your hands). . Avoid shaking hands with others and consider head nods or verbal greetings only. . Avoid touching your eyes, nose, or mouth with unwashed hands.  . Avoid close contact with people who are sick. . Avoid places or events with large numbers of people in one location, like concerts or sporting events. . Carefully consider travel plans you have or are making. . If you are planning any travel outside or inside the [place], visit the CDC's Travelers' Health webpage for the latest health notices. . If you have some symptoms but not all symptoms, continue to monitor at home and seek medical attention if your symptoms worsen. . If you are having a medical emergency, call 911.   ADDITIONAL HEALTHCARE OPTIONS FOR PATIENTS  [facility] / e-Visit: [email or web address]         [facility]: [phone]  [facility]: [phone]                   [facility]: [phone]   Pre-screen negative, DM.   

## 2019-06-22 ENCOUNTER — Other Ambulatory Visit: Payer: Self-pay

## 2019-06-22 ENCOUNTER — Ambulatory Visit (INDEPENDENT_AMBULATORY_CARE_PROVIDER_SITE_OTHER): Payer: Medicaid Other | Admitting: Obstetrics and Gynecology

## 2019-06-22 VITALS — BP 100/62 | HR 98 | Wt 140.6 lb

## 2019-06-22 DIAGNOSIS — Z3493 Encounter for supervision of normal pregnancy, unspecified, third trimester: Secondary | ICD-10-CM

## 2019-06-22 LAB — POCT URINALYSIS DIPSTICK OB
Bilirubin, UA: NEGATIVE
Blood, UA: NEGATIVE
Glucose, UA: NEGATIVE
Ketones, UA: NEGATIVE
Leukocytes, UA: NEGATIVE
Nitrite, UA: NEGATIVE
POC,PROTEIN,UA: NEGATIVE
Spec Grav, UA: 1.01 (ref 1.010–1.025)
Urobilinogen, UA: 0.2 E.U./dL
pH, UA: 6 (ref 5.0–8.0)

## 2019-06-22 MED ORDER — CITRANATAL BLOOM 90-1 MG PO TABS: 1.0000 | ORAL_TABLET | Freq: Every day | ORAL | 11 refills | Status: DC

## 2019-06-22 NOTE — Progress Notes (Signed)
ROB- pt is doing well 

## 2019-06-22 NOTE — Progress Notes (Signed)
ROB- discussed iron supplementation-prescribed Citranatal bloom, will see physician at next visit to schedule repeat c/s

## 2019-06-27 ENCOUNTER — Telehealth: Payer: Self-pay | Admitting: Physician Assistant

## 2019-06-27 ENCOUNTER — Encounter: Payer: Self-pay | Admitting: Physician Assistant

## 2019-06-27 DIAGNOSIS — R197 Diarrhea, unspecified: Secondary | ICD-10-CM

## 2019-06-27 DIAGNOSIS — R112 Nausea with vomiting, unspecified: Secondary | ICD-10-CM

## 2019-06-27 NOTE — Progress Notes (Signed)
We are sorry that you are not feeling well.  Here is how we plan to help!  Based on what you have shared with me it looks like you have Acute Infectious Diarrhea.  Most cases of acute diarrhea are due to infections with virus and bacteria and are self-limited conditions lasting less than 14 days.  Due to pregnancy, avoid taking any medications including OTC medications. If your symptoms continue, you would be at risk of becoming hydrated. Signs of dehydration include weakness, fatigue, headache, dizziness, palpitations. If you suspect that you may be dehydrated, go to an urgent care or the ED.  I have provided a work note  HOME CARE  We recommend changing your diet to help with your symptoms for the next few days.  Drink plenty of fluids that contain water salt and sugar. Sports drinks such as Gatorade may help.   You may try broths, soups, bananas, applesauce, soft breads, mashed potatoes or crackers.   You are considered infectious for as long as the diarrhea continues. Hand washing or use of alcohol based hand sanitizers is recommend.  It is best to stay out of work or school until your symptoms stop.   GET HELP RIGHT AWAY  If you have dark yellow colored urine or do not pass urine frequently you should drink more fluids.    If your symptoms worsen   If you feel like you are going to pass out (faint)  You have a new problem  MAKE SURE YOU   Understand these instructions.  Will watch your condition.  Will get help right away if you are not doing well or get worse.  Your e-visit answers were reviewed by a board certified advanced clinical practitioner to complete your personal care plan.  Depending on the condition, your plan could have included both over the counter or prescription medications.  If there is a problem please reply  once you have received a response from your provider.  Your safety is important to [place].  If you have drug allergies check your prescription  carefully.    You can use MyChart to ask questions about today's visit, request a non-urgent call back, or ask for a work or school excuse for 24 hours related to this e-Visit. If it has been greater than 24 hours you will need to follow up with your provider, or enter a new e-Visit to address those concerns.   You will get an e-mail in the next two days asking about your experience.  I hope that your e-visit has been valuable and will speed your recovery. Thank you for using e-visits.  I spent 5-10 minutes on review and completion of this note- Lacy Duverney [facility]

## 2019-07-06 ENCOUNTER — Other Ambulatory Visit: Payer: Self-pay

## 2019-07-06 ENCOUNTER — Encounter: Payer: Medicaid Other | Admitting: Obstetrics and Gynecology

## 2019-07-06 ENCOUNTER — Ambulatory Visit (INDEPENDENT_AMBULATORY_CARE_PROVIDER_SITE_OTHER): Payer: Medicaid Other | Admitting: Obstetrics and Gynecology

## 2019-07-06 VITALS — BP 102/67 | HR 98 | Wt 142.9 lb

## 2019-07-06 DIAGNOSIS — O09293 Supervision of pregnancy with other poor reproductive or obstetric history, third trimester: Secondary | ICD-10-CM

## 2019-07-06 DIAGNOSIS — Z3493 Encounter for supervision of normal pregnancy, unspecified, third trimester: Secondary | ICD-10-CM

## 2019-07-06 DIAGNOSIS — Z87828 Personal history of other (healed) physical injury and trauma: Secondary | ICD-10-CM

## 2019-07-06 LAB — POCT URINALYSIS DIPSTICK OB
Bilirubin, UA: NEGATIVE
Blood, UA: NEGATIVE
Glucose, UA: NEGATIVE
Ketones, UA: NEGATIVE
Nitrite, UA: NEGATIVE
Spec Grav, UA: 1.015 (ref 1.010–1.025)
Urobilinogen, UA: 0.2 E.U./dL
pH, UA: 6.5 (ref 5.0–8.0)

## 2019-07-06 NOTE — Progress Notes (Signed)
ROB and consult about c-section.  Patient c/o intermittent pelvic pressure x2 weeks.

## 2019-07-06 NOTE — Progress Notes (Signed)
ROB: Discussed C-section in light of previous uterine rupture.  Risks and benefits discussed.  Risks and benefits of early delivery discussed.  Signs and symptoms of labor and contractions discussed and warnings given regarding premature labor and uterine rupture.  Patient inquired about permanent sterilization we have discussed this in detail.  She does not want this at this time and she is not completely sure she has finished childbearing.  Cesarean delivery formula scheduled for August 14.

## 2019-07-20 ENCOUNTER — Encounter: Payer: Self-pay | Admitting: Certified Nurse Midwife

## 2019-07-20 ENCOUNTER — Other Ambulatory Visit: Payer: Self-pay

## 2019-07-20 ENCOUNTER — Ambulatory Visit (INDEPENDENT_AMBULATORY_CARE_PROVIDER_SITE_OTHER): Payer: Self-pay | Admitting: Certified Nurse Midwife

## 2019-07-20 VITALS — BP 102/60 | HR 112 | Wt 144.1 lb

## 2019-07-20 DIAGNOSIS — Z3493 Encounter for supervision of normal pregnancy, unspecified, third trimester: Secondary | ICD-10-CM

## 2019-07-20 LAB — POCT URINALYSIS DIPSTICK OB
Bilirubin, UA: NEGATIVE
Blood, UA: NEGATIVE
Glucose, UA: NEGATIVE
Leukocytes, UA: NEGATIVE
Nitrite, UA: NEGATIVE
Spec Grav, UA: 1.02 (ref 1.010–1.025)
Urobilinogen, UA: 0.2 E.U./dL
pH, UA: 6 (ref 5.0–8.0)

## 2019-07-20 NOTE — Patient Instructions (Signed)
Braxton Hicks Contractions Contractions of the uterus can occur throughout pregnancy, but they are not always a sign that you are in labor. You may have practice contractions called Braxton Hicks contractions. These false labor contractions are sometimes confused with true labor. What are Braxton Hicks contractions? Braxton Hicks contractions are tightening movements that occur in the muscles of the uterus before labor. Unlike true labor contractions, these contractions do not result in opening (dilation) and thinning of the cervix. Toward the end of pregnancy (32-34 weeks), Braxton Hicks contractions can happen more often and may become stronger. These contractions are sometimes difficult to tell apart from true labor because they can be very uncomfortable. You should not feel embarrassed if you go to the hospital with false labor. Sometimes, the only way to tell if you are in true labor is for your health care provider to look for changes in the cervix. The health care provider will do a physical exam and may monitor your contractions. If you are not in true labor, the exam should show that your cervix is not dilating and your water has not broken. If there are no other health problems associated with your pregnancy, it is completely safe for you to be sent home with false labor. You may continue to have Braxton Hicks contractions until you go into true labor. How to tell the difference between true labor and false labor True labor  Contractions last 30-70 seconds.  Contractions become very regular.  Discomfort is usually felt in the top of the uterus, and it spreads to the lower abdomen and low back.  Contractions do not go away with walking.  Contractions usually become more intense and increase in frequency.  The cervix dilates and gets thinner. False labor  Contractions are usually shorter and not as strong as true labor contractions.  Contractions are usually irregular.  Contractions  are often felt in the front of the lower abdomen and in the groin.  Contractions may go away when you walk around or change positions while lying down.  Contractions get weaker and are shorter-lasting as time goes on.  The cervix usually does not dilate or become thin. Follow these instructions at home:   Take over-the-counter and prescription medicines only as told by your health care provider.  Keep up with your usual exercises and follow other instructions from your health care provider.  Eat and drink lightly if you think you are going into labor.  If Braxton Hicks contractions are making you uncomfortable: ? Change your position from lying down or resting to walking, or change from walking to resting. ? Sit and rest in a tub of warm water. ? Drink enough fluid to keep your urine pale yellow. Dehydration may cause these contractions. ? Do slow and deep breathing several times an hour.  Keep all follow-up prenatal visits as told by your health care provider. This is important. Contact a health care provider if:  You have a fever.  You have continuous pain in your abdomen. Get help right away if:  Your contractions become stronger, more regular, and closer together.  You have fluid leaking or gushing from your vagina.  You pass blood-tinged mucus (bloody show).  You have bleeding from your vagina.  You have low back pain that you never had before.  You feel your baby's head pushing down and causing pelvic pressure.  Your baby is not moving inside you as much as it used to. Summary  Contractions that occur before labor are   called Braxton Hicks contractions, false labor, or practice contractions.  Braxton Hicks contractions are usually shorter, weaker, farther apart, and less regular than true labor contractions. True labor contractions usually become progressively stronger and regular, and they become more frequent.  Manage discomfort from Braxton Hicks contractions  by changing position, resting in a warm bath, drinking plenty of water, or practicing deep breathing. This information is not intended to replace advice given to you by your health care provider. Make sure you discuss any questions you have with your health care provider. Document Released: 04/23/2017 Document Revised: 11/20/2017 Document Reviewed: 04/23/2017 Elsevier Patient Education  2020 [employer]  

## 2019-07-20 NOTE — Progress Notes (Signed)
ROB doing well. Feels good movement. Discussed GBS at next visit. She will see MD for pre op next week. Scheduled for repeat c/s on 8/14.   Philip Aspen, CNM

## 2019-07-20 NOTE — Progress Notes (Signed)
ROB-No complaints.  

## 2019-07-26 ENCOUNTER — Telehealth: Payer: Self-pay

## 2019-07-26 NOTE — Telephone Encounter (Signed)
Coronavirus (COVID-19) Are you at risk?  Are you at risk for the Coronavirus (COVID-19)?  To be considered HIGH RISK for Coronavirus (COVID-19), you have to meet the following criteria:  . Traveled to [place], [place], [place], [place] or [place]; or in the [place] to [place], [place], [place], or [place]; and have fever, cough, and shortness of breath within the last 2 weeks of travel OR . Been in close contact with a person diagnosed with COVID-19 within the last 2 weeks and have fever, cough, and shortness of breath . IF YOU DO NOT MEET THESE CRITERIA, YOU ARE CONSIDERED LOW RISK FOR COVID-19.  What to do if you are HIGH RISK for COVID-19?  . If you are having a medical emergency, call 911. . Seek medical care right away. Before you go to a doctor's office, urgent care or emergency department, call ahead and tell them about your recent travel, contact with someone diagnosed with COVID-19, and your symptoms. You should receive instructions from your physician's office regarding next steps of care.  . When you arrive at healthcare provider, tell the healthcare staff immediately you have returned from visiting [place], [place], [place], [place] or [place]; or traveled in the [place] to [place], [place], [place], or [place]; in the last two weeks or you have been in close contact with a person diagnosed with COVID-19 in the last 2 weeks.   . Tell the health care staff about your symptoms: fever, cough and shortness of breath. . After you have been seen by a medical provider, you will be either: o Tested for (COVID-19) and discharged home on quarantine except to seek medical care if symptoms worsen, and asked to  - Stay home and avoid contact with others until you get your results (4-5 days)  - Avoid travel on public transportation if possible (such as bus, train, or airplane) or o Sent to the Emergency Department by EMS for evaluation, COVID-19 testing, and possible  admission depending on your condition and test results.  What to do if you are LOW RISK for COVID-19?  Reduce your risk of any infection by using the same precautions used for avoiding the common cold or flu:  . Wash your hands often with soap and warm water for at least 20 seconds.  If soap and water are not readily available, use an alcohol-based hand sanitizer with at least 60% alcohol.  . If coughing or sneezing, cover your mouth and nose by coughing or sneezing into the elbow areas of your shirt or coat, into a tissue or into your sleeve (not your hands). . Avoid shaking hands with others and consider head nods or verbal greetings only. . Avoid touching your eyes, nose, or mouth with unwashed hands.  . Avoid close contact with people who are Rockelle Heuerman. . Avoid places or events with large numbers of people in one location, like concerts or sporting events. . Carefully consider travel plans you have or are making. . If you are planning any travel outside or inside the [place], visit the CDC's Travelers' Health webpage for the latest health notices. . If you have some symptoms but not all symptoms, continue to monitor at home and seek medical attention if your symptoms worsen. . If you are having a medical emergency, call 911.  07/26/19 SCREENING NEG SLS ADDITIONAL HEALTHCARE OPTIONS FOR PATIENTS  [facility] / e-Visit: [email or web address]         [facility]: [phone]    [facility]: [phone]                   [facility]: [phone]  

## 2019-07-27 ENCOUNTER — Ambulatory Visit (INDEPENDENT_AMBULATORY_CARE_PROVIDER_SITE_OTHER): Payer: Self-pay | Admitting: Obstetrics and Gynecology

## 2019-07-27 ENCOUNTER — Encounter: Payer: Self-pay | Admitting: Obstetrics and Gynecology

## 2019-07-27 ENCOUNTER — Other Ambulatory Visit: Payer: Self-pay

## 2019-07-27 VITALS — BP 112/72 | HR 88 | Ht 59.0 in | Wt 146.1 lb

## 2019-07-27 DIAGNOSIS — N762 Acute vulvitis: Secondary | ICD-10-CM

## 2019-07-27 MED ORDER — AMOXICILLIN-POT CLAVULANATE 875-125 MG PO TABS: 1.0000 | ORAL_TABLET | Freq: Two times a day (BID) | ORAL | 0 refills | Status: AC

## 2019-07-27 NOTE — Progress Notes (Signed)
HPI:      Ms. Tammy Ward is a 23 y.o. G2P1001 who LMP was Patient's last menstrual period was 11/19/2018 (exact date).  Subjective:   She presents today with complaint of a vulvar cyst which is very painful.  She says it started on Sunday and has gotten worse each day since then.  She reports a history of Bartholin gland cyst and she says it seems like that only lower.  She denies fever. She reports no problems with her pregnancy.    Hx: The following portions of the patient's history were reviewed and updated as appropriate:             She  has a past medical history of Kidney stones and Left genital labial abscess. She does not have any pertinent problems on file. She  has a past surgical history that includes Kidney stone surgery (2014); Abscess drainage (02-02-15); and Cesarean section (N/A, 11/18/2017). Her family history includes Asthma in her sister; Thyroid disease in her sister. She  reports that she has never smoked. She has never used smokeless tobacco. She reports that she does not drink alcohol or use drugs. She has a current medication list which includes the following prescription(s): amoxicillin-clavulanate and citranatal bloom. She has No Known Allergies.       Review of Systems:  Review of Systems  Constitutional: Denied constitutional symptoms, night sweats, recent illness, fatigue, fever, insomnia and weight loss.  Eyes: Denied eye symptoms, eye pain, photophobia, vision change and visual disturbance.  Ears/Nose/Throat/Neck: Denied ear, nose, throat or neck symptoms, hearing loss, nasal discharge, sinus congestion and sore throat.  Cardiovascular: Denied cardiovascular symptoms, arrhythmia, chest pain/pressure, edema, exercise intolerance, orthopnea and palpitations.  Respiratory: Denied pulmonary symptoms, asthma, pleuritic pain, productive sputum, cough, dyspnea and wheezing.  Gastrointestinal: Denied, gastro-esophageal reflux, melena, nausea and vomiting.   Genitourinary: See HPI for additional information.  Musculoskeletal: Denied musculoskeletal symptoms, stiffness, swelling, muscle weakness and myalgia.  Dermatologic: Denied dermatology symptoms, rash and scar.  Neurologic: Denied neurology symptoms, dizziness, headache, neck pain and syncope.  Psychiatric: Denied psychiatric symptoms, anxiety and depression.  Endocrine: Denied endocrine symptoms including hot flashes and night sweats.   Meds:   Current Outpatient Medications on File Prior to Visit  Medication Sig Dispense Refill  . Prenatal-DSS-FeCb-FeGl-FA (CITRANATAL BLOOM) 90-1 MG TABS Take 1 tablet by mouth daily. 30 tablet 11   No current facility-administered medications on file prior to visit.     Objective:     Vitals:   07/27/19 1104  BP: 112/72  Pulse: 88              Physical examination   Pelvic:   Vulva:  Right-sided cellulitis/abscess formation below her right labia.   Not Bartholin gland cyst  Vagina: No lesions or abnormalities noted.  Support: Normal pelvic support.  Urethra No masses tenderness or scarring.  Meatus Normal size without lesions or prolapse.  Cervix: Normal appearance.  No lesions.  Anus: Normal exam.  No lesions.  Perineum: Normal exam.  No lesions.   Fetal heart tones 146.  Procedure: The area was cleaned with Betadine and injected with a small amount of lidocaine with epinephrine.  Stab incision was made through the thin skin layer and the cystic structure was drained of a purulent drainage.  A single silver nitrate stick was used to provide excellent hemostasis.  Patient tolerated this well.  Assessment:    G2P1001 Patient Active Problem List   Diagnosis Date Noted  . Anemia  in pregnancy 06/10/2019  . History of rupture of uterus 01/06/2019  . History of low transverse cesarean section 01/06/2019     1. Vulvar cellulitis     Labial infection likely secondary to shaving.  Not a Bartholin gland cyst.     Plan:             1.  I&D performed.  Patient to begin short course of antibiotics.  Daily twice daily sitz bath's. Orders No orders of the defined types were placed in this encounter.    Meds ordered this encounter  Medications  . amoxicillin-clavulanate (AUGMENTIN) 875-125 MG tablet    Sig: Take 1 tablet by mouth 2 (two) times daily for 5 days.    Dispense:  10 tablet    Refill:  0      F/U  Return in about 1 week (around 08/03/2019).  Finis Bud, M.D. 07/27/2019 3:21 PM

## 2019-07-27 NOTE — Progress Notes (Signed)
Patient has a cyst on vaginal area.

## 2019-08-02 ENCOUNTER — Ambulatory Visit (INDEPENDENT_AMBULATORY_CARE_PROVIDER_SITE_OTHER): Payer: Medicaid Other | Admitting: Obstetrics and Gynecology

## 2019-08-02 ENCOUNTER — Encounter: Payer: Self-pay | Admitting: Obstetrics and Gynecology

## 2019-08-02 ENCOUNTER — Ambulatory Visit
Admission: RE | Admit: 2019-08-02 | Discharge: 2019-08-02 | Disposition: A | Discharge status: Home / Self Care | Payer: HRSA Program | Source: Ambulatory Visit | Attending: Obstetrics and Gynecology | Admitting: Obstetrics and Gynecology

## 2019-08-02 ENCOUNTER — Other Ambulatory Visit: Payer: Self-pay

## 2019-08-02 ENCOUNTER — Encounter
Admission: RE | Admit: 2019-08-02 | Discharge: 2019-08-02 | Disposition: A | Discharge status: Home / Self Care | Payer: HRSA Program | Source: Ambulatory Visit | Attending: Obstetrics and Gynecology | Admitting: Obstetrics and Gynecology

## 2019-08-02 VITALS — BP 96/60 | HR 90 | Ht 59.0 in | Wt 147.3 lb

## 2019-08-02 DIAGNOSIS — Z01812 Encounter for preprocedural laboratory examination: Secondary | ICD-10-CM | POA: Diagnosis present

## 2019-08-02 DIAGNOSIS — Z87828 Personal history of other (healed) physical injury and trauma: Secondary | ICD-10-CM

## 2019-08-02 DIAGNOSIS — Z8759 Personal history of other complications of pregnancy, childbirth and the puerperium: Secondary | ICD-10-CM | POA: Insufficient documentation

## 2019-08-02 DIAGNOSIS — Z20828 Contact with and (suspected) exposure to other viral communicable diseases: Secondary | ICD-10-CM | POA: Diagnosis not present

## 2019-08-02 DIAGNOSIS — Z3493 Encounter for supervision of normal pregnancy, unspecified, third trimester: Secondary | ICD-10-CM

## 2019-08-02 HISTORY — DX: Anemia, unspecified: D64.9

## 2019-08-02 HISTORY — DX: Personal history of urinary calculi: Z87.442

## 2019-08-02 LAB — POCT URINALYSIS DIPSTICK OB
Bilirubin, UA: NEGATIVE
Blood, UA: NEGATIVE
Glucose, UA: NEGATIVE
Leukocytes, UA: NEGATIVE
Nitrite, UA: NEGATIVE
Spec Grav, UA: 1.01 (ref 1.010–1.025)
Urobilinogen, UA: 0.2 E.U./dL
pH, UA: 6.5 (ref 5.0–8.0)

## 2019-08-02 LAB — SARS CORONAVIRUS 2 (TAT 6-24 HRS): SARS Coronavirus 2: NEGATIVE

## 2019-08-02 NOTE — Progress Notes (Signed)
ROB: She has no complaints today.  She reports that her abscess is almost completely resolved.  It is not tender any longer.  Reports normal daily movement.  All questions regarding C-section answered.

## 2019-08-02 NOTE — Addendum Note (Signed)
Addended by: Durwin Glaze on: 08/02/2019 03:45 PM   Modules accepted: Orders

## 2019-08-02 NOTE — Progress Notes (Signed)
Patient comes in today for ROB visit. No concerns today. 

## 2019-08-02 NOTE — Patient Instructions (Signed)
Your procedure is scheduled on: 08/05/19 ARRIVE AT EMERGENCY ROOM ENTRANCE AT 0530 AM   Remember: Instructions that are not followed completely may result in serious medical risk,  up to and including death, or upon the discretion of your surgeon and anesthesiologist your  surgery may need to be rescheduled.     _X__ 1. Do not eat food after midnight the night before your procedure.                 No gum chewing or hard candies. You may drink clear liquids up to 2 hours                 before you are scheduled to arrive for your surgery- DO not drink clear                 liquids within 2 hours of the start of your surgery.                 Clear Liquids include:  water, apple juice without pulp, clear carbohydrate                 drink such as Clearfast of Gatorade, Black Coffee or Tea (Do not add                 anything to coffee or tea). FINISH CARB DRINK BY 4:30 AM MORNING OF SURGERY  __X__2.  On the morning of surgery brush your teeth with toothpaste and water, you                may rinse your mouth with mouthwash if you wish.  Do not swallow any toothpaste of mouthwash.     _X__ 3.  No Alcohol for 24 hours before or after surgery.   _X__ 4.  Do Not Smoke or use e-cigarettes For 24 Hours Prior to Your Surgery.                 Do not use any chewable tobacco products for at least 6 hours prior to                 surgery.  ____  5.  Bring all medications with you on the day of surgery if instructed.   _X___  6.  Notify your doctor if there is any change in your medical condition      (cold, fever, infections).     Do not wear jewelry, make-up, hairpins, clips or nail polish. Do not wear lotions, powders, or perfumes. You may wear deodorant. Do not shave 48 hours prior to surgery. Men may shave face and neck. Do not bring valuables to the hospital.    [facility] is not responsible for any belongings or valuables.  Contacts, dentures or bridgework may  not be worn into surgery. Leave your suitcase in the car. After surgery it may be brought to your room. For patients admitted to the hospital, discharge time is determined by your treatment team.   Patients discharged the day of surgery will not be allowed to drive home.   Please read over the following fact sheets that you were given:   Surgical Site Infection Prevention          ____ Take these medicines the morning of surgery with A SIP OF WATER:    1. NONE  2.   3.   4.  5.  6.  ____ Fleet Enema (as directed)   __X__ Use CHG Soap as directed  DO NOT PUT ON BREASTS  ____ Use inhalers on the day of surgery  ____ Stop metformin 2 days prior to surgery    ____ Take 1/2 of usual insulin dose the night before surgery. No insulin the morning          of surgery.   ____ Stop Coumadin/Plavix/aspirin on   ____ Stop Anti-inflammatories on    ____ Stop supplements until after surgery.    ____ Bring C-Pap to the hospital.

## 2019-08-04 LAB — STREP GP B NAA: Strep Gp B NAA: NEGATIVE

## 2019-08-04 MED ORDER — DEXTROSE 5 % IV SOLN
3.0000 g | INTRAVENOUS | Status: DC
  Filled 2019-08-04 (×2): qty 3000

## 2019-08-05 ENCOUNTER — Inpatient Hospital Stay: Payer: Medicaid Other | Admitting: Anesthesiology

## 2019-08-05 ENCOUNTER — Inpatient Hospital Stay
Admission: RE | Admit: 2019-08-05 | Discharge: 2019-08-07 | DRG: 788 | Disposition: A | Discharge status: Home / Self Care | Payer: Medicaid Other | Attending: Obstetrics and Gynecology | Admitting: Obstetrics and Gynecology

## 2019-08-05 ENCOUNTER — Encounter
Admission: RE | Disposition: A | Discharge status: Home / Self Care | Payer: Self-pay | Source: Home / Self Care | Attending: Obstetrics and Gynecology

## 2019-08-05 ENCOUNTER — Other Ambulatory Visit: Payer: Self-pay

## 2019-08-05 ENCOUNTER — Encounter: Payer: Self-pay | Admitting: *Deleted

## 2019-08-05 DIAGNOSIS — O34211 Maternal care for low transverse scar from previous cesarean delivery: Secondary | ICD-10-CM

## 2019-08-05 DIAGNOSIS — O9902 Anemia complicating childbirth: Secondary | ICD-10-CM | POA: Diagnosis present

## 2019-08-05 DIAGNOSIS — D649 Anemia, unspecified: Secondary | ICD-10-CM | POA: Diagnosis present

## 2019-08-05 DIAGNOSIS — Z3A37 37 weeks gestation of pregnancy: Secondary | ICD-10-CM

## 2019-08-05 DIAGNOSIS — O99019 Anemia complicating pregnancy, unspecified trimester: Secondary | ICD-10-CM

## 2019-08-05 LAB — CBC
HCT: 31.5 % — ABNORMAL LOW (ref 36.0–46.0)
Hemoglobin: 10.1 g/dL — ABNORMAL LOW (ref 12.0–15.0)
MCH: 29.8 pg (ref 26.0–34.0)
MCHC: 32.1 g/dL (ref 30.0–36.0)
MCV: 92.9 fL (ref 80.0–100.0)
Platelets: 186 10*3/uL (ref 150–400)
RBC: 3.39 MIL/uL — ABNORMAL LOW (ref 3.87–5.11)
RDW: 15.1 % (ref 11.5–15.5)
WBC: 10.8 10*3/uL — ABNORMAL HIGH (ref 4.0–10.5)
nRBC: 0 % (ref 0.0–0.2)

## 2019-08-05 LAB — TYPE AND SCREEN
ABO/RH(D): O POS
Antibody Screen: NEGATIVE

## 2019-08-05 SURGERY — Surgical Case
Anesthesia: Spinal

## 2019-08-05 MED ORDER — KETOROLAC TROMETHAMINE 30 MG/ML IJ SOLN: 30.0000 mg | Freq: Four times a day (QID) | INTRAMUSCULAR | Status: AC

## 2019-08-05 MED ORDER — OXYTOCIN 40 UNITS IN NORMAL SALINE INFUSION - SIMPLE MED
2.5000 [IU]/h | INTRAVENOUS | Status: DC
  Administered 2019-08-05: 2.5 [IU]/h via INTRAVENOUS
  Filled 2019-08-05: qty 1000

## 2019-08-05 MED ORDER — LIDOCAINE 5 % EX PTCH
MEDICATED_PATCH | CUTANEOUS | Status: DC | PRN
  Administered 2019-08-05: 1 via TRANSDERMAL

## 2019-08-05 MED ORDER — OXYTOCIN 40 UNITS IN NORMAL SALINE INFUSION - SIMPLE MED
INTRAVENOUS | Status: DC | PRN
  Administered 2019-08-05: 1000 mL via INTRAVENOUS

## 2019-08-05 MED ORDER — NALBUPHINE HCL 10 MG/ML IJ SOLN: 5.0000 mg | Freq: Once | INTRAMUSCULAR | Status: DC | PRN

## 2019-08-05 MED ORDER — LACTATED RINGERS IV SOLN: INTRAVENOUS | Status: DC

## 2019-08-05 MED ORDER — NALBUPHINE HCL 10 MG/ML IJ SOLN: 5.0000 mg | INTRAMUSCULAR | Status: DC | PRN

## 2019-08-05 MED ORDER — ACETAMINOPHEN 325 MG PO TABS
650.0000 mg | ORAL_TABLET | Freq: Four times a day (QID) | ORAL | Status: AC
  Administered 2019-08-05 – 2019-08-06 (×4): 650 mg via ORAL
  Filled 2019-08-05 (×4): qty 2

## 2019-08-05 MED ORDER — FENTANYL CITRATE (PF) 100 MCG/2ML IJ SOLN
INTRAMUSCULAR | Status: AC
  Filled 2019-08-05: qty 2

## 2019-08-05 MED ORDER — FENTANYL CITRATE (PF) 100 MCG/2ML IJ SOLN
INTRAMUSCULAR | Status: DC | PRN
  Administered 2019-08-05: 15 ug via INTRATHECAL

## 2019-08-05 MED ORDER — SIMETHICONE 80 MG PO CHEW
80.0000 mg | CHEWABLE_TABLET | Freq: Four times a day (QID) | ORAL | Status: DC
  Administered 2019-08-05 – 2019-08-07 (×7): 80 mg via ORAL
  Filled 2019-08-05 (×7): qty 1

## 2019-08-05 MED ORDER — LACTATED RINGERS IV SOLN
INTRAVENOUS | Status: DC
  Administered 2019-08-05 (×2): via INTRAVENOUS

## 2019-08-05 MED ORDER — OXYCODONE HCL 5 MG PO TABS: 10.0000 mg | ORAL_TABLET | ORAL | Status: DC | PRN

## 2019-08-05 MED ORDER — DIPHENHYDRAMINE HCL 25 MG PO CAPS: 25.0000 mg | ORAL_CAPSULE | Freq: Four times a day (QID) | ORAL | Status: DC | PRN

## 2019-08-05 MED ORDER — MEPERIDINE HCL 25 MG/ML IJ SOLN: 6.2500 mg | INTRAMUSCULAR | Status: DC | PRN

## 2019-08-05 MED ORDER — IBUPROFEN 800 MG PO TABS
800.0000 mg | ORAL_TABLET | Freq: Three times a day (TID) | ORAL | Status: DC
  Administered 2019-08-05 – 2019-08-07 (×5): 800 mg via ORAL
  Filled 2019-08-05 (×5): qty 1

## 2019-08-05 MED ORDER — SODIUM CHLORIDE 0.9% FLUSH: 3.0000 mL | INTRAVENOUS | Status: DC | PRN

## 2019-08-05 MED ORDER — EPHEDRINE SULFATE 50 MG/ML IJ SOLN
INTRAMUSCULAR | Status: DC | PRN
  Administered 2019-08-05 (×2): 15 mg via INTRAVENOUS

## 2019-08-05 MED ORDER — DEXAMETHASONE SODIUM PHOSPHATE 10 MG/ML IJ SOLN
INTRAMUSCULAR | Status: DC | PRN
  Administered 2019-08-05: 10 mg via INTRAVENOUS

## 2019-08-05 MED ORDER — OXYCODONE HCL 5 MG PO TABS: 5.0000 mg | ORAL_TABLET | ORAL | Status: DC | PRN

## 2019-08-05 MED ORDER — MENTHOL 3 MG MT LOZG
1.0000 | LOZENGE | OROMUCOSAL | Status: DC | PRN
  Filled 2019-08-05: qty 9

## 2019-08-05 MED ORDER — LIDOCAINE 5 % EX PTCH
MEDICATED_PATCH | CUTANEOUS | Status: AC
  Filled 2019-08-05: qty 1

## 2019-08-05 MED ORDER — ONDANSETRON HCL 4 MG/2ML IJ SOLN: 4.0000 mg | Freq: Three times a day (TID) | INTRAMUSCULAR | Status: DC | PRN

## 2019-08-05 MED ORDER — ONDANSETRON HCL 4 MG/2ML IJ SOLN
INTRAMUSCULAR | Status: DC | PRN
  Administered 2019-08-05: 4 mg via INTRAVENOUS

## 2019-08-05 MED ORDER — SODIUM CHLORIDE 0.9 % IV SOLN
INTRAVENOUS | Status: DC | PRN
  Administered 2019-08-05: 50 ug/min via INTRAVENOUS

## 2019-08-05 MED ORDER — OXYTOCIN 40 UNITS IN NORMAL SALINE INFUSION - SIMPLE MED
INTRAVENOUS | Status: AC
  Filled 2019-08-05: qty 1000

## 2019-08-05 MED ORDER — NALOXONE HCL 0.4 MG/ML IJ SOLN: 0.4000 mg | INTRAMUSCULAR | Status: DC | PRN

## 2019-08-05 MED ORDER — PHENYLEPHRINE 40 MCG/ML (10ML) SYRINGE FOR IV PUSH (FOR BLOOD PRESSURE SUPPORT)
PREFILLED_SYRINGE | INTRAVENOUS | Status: DC | PRN
  Administered 2019-08-05 (×2): 100 ug via INTRAVENOUS

## 2019-08-05 MED ORDER — ZOLPIDEM TARTRATE 5 MG PO TABS: 5.0000 mg | ORAL_TABLET | Freq: Every evening | ORAL | Status: DC | PRN

## 2019-08-05 MED ORDER — OXYTOCIN 40 UNITS IN NORMAL SALINE INFUSION - SIMPLE MED: 2.5000 [IU]/h | INTRAVENOUS | Status: AC

## 2019-08-05 MED ORDER — BUPIVACAINE IN DEXTROSE 0.75-8.25 % IT SOLN
INTRATHECAL | Status: DC | PRN
  Administered 2019-08-05: 1.6 mL via INTRATHECAL

## 2019-08-05 MED ORDER — DIPHENHYDRAMINE HCL 50 MG/ML IJ SOLN: 12.5000 mg | INTRAMUSCULAR | Status: DC | PRN

## 2019-08-05 MED ORDER — CEFAZOLIN SODIUM-DEXTROSE 2-4 GM/100ML-% IV SOLN
2.0000 g | INTRAVENOUS | Status: DC
  Filled 2019-08-05 (×2): qty 100

## 2019-08-05 MED ORDER — OXYCODONE-ACETAMINOPHEN 5-325 MG PO TABS
1.0000 | ORAL_TABLET | ORAL | Status: DC | PRN
  Administered 2019-08-06: 1 via ORAL
  Filled 2019-08-05: qty 1

## 2019-08-05 MED ORDER — DIPHENHYDRAMINE HCL 25 MG PO CAPS: 25.0000 mg | ORAL_CAPSULE | ORAL | Status: DC | PRN

## 2019-08-05 MED ORDER — PRENATAL MULTIVITAMIN CH
1.0000 | ORAL_TABLET | Freq: Every day | ORAL | Status: DC
  Administered 2019-08-05 – 2019-08-07 (×3): 1 via ORAL
  Filled 2019-08-05 (×3): qty 1

## 2019-08-05 MED ORDER — KETOROLAC TROMETHAMINE 30 MG/ML IJ SOLN
INTRAMUSCULAR | Status: DC | PRN
  Administered 2019-08-05: 30 mg via INTRAVENOUS

## 2019-08-05 MED ORDER — LACTATED RINGERS IV BOLUS
1000.0000 mL | Freq: Once | INTRAVENOUS | Status: AC
  Administered 2019-08-05: 1000 mL via INTRAVENOUS

## 2019-08-05 MED ORDER — SENNOSIDES-DOCUSATE SODIUM 8.6-50 MG PO TABS
2.0000 | ORAL_TABLET | ORAL | Status: DC
  Administered 2019-08-06 (×2): 2 via ORAL
  Filled 2019-08-05 (×2): qty 2

## 2019-08-05 MED ORDER — KETOROLAC TROMETHAMINE 30 MG/ML IJ SOLN
30.0000 mg | Freq: Four times a day (QID) | INTRAMUSCULAR | Status: AC
  Administered 2019-08-05 – 2019-08-06 (×3): 30 mg via INTRAVENOUS
  Filled 2019-08-05 (×3): qty 1

## 2019-08-05 MED ORDER — SOD CITRATE-CITRIC ACID 500-334 MG/5ML PO SOLN
ORAL | Status: AC
  Administered 2019-08-05: 30 mL
  Filled 2019-08-05: qty 30

## 2019-08-05 MED ORDER — MORPHINE SULFATE (PF) 0.5 MG/ML IJ SOLN
INTRAMUSCULAR | Status: DC | PRN
  Administered 2019-08-05: .1 mg via INTRATHECAL

## 2019-08-05 MED ORDER — MORPHINE SULFATE (PF) 0.5 MG/ML IJ SOLN
INTRAMUSCULAR | Status: AC
  Filled 2019-08-05: qty 10

## 2019-08-05 SURGICAL SUPPLY — 26 items
ADHESIVE MASTISOL STRL (MISCELLANEOUS) ×3 IMPLANT
BAG COUNTER SPONGE EZ (MISCELLANEOUS) ×2 IMPLANT
CANISTER SUCT 3000ML PPV (MISCELLANEOUS) ×3 IMPLANT
CHLORAPREP W/TINT 26 (MISCELLANEOUS) ×6 IMPLANT
COUNTER SPONGE BAG EZ (MISCELLANEOUS) ×1
COVER WAND RF STERILE (DRAPES) ×3 IMPLANT
DRSG TELFA 3X8 NADH (GAUZE/BANDAGES/DRESSINGS) ×3 IMPLANT
GAUZE SPONGE 4X4 12PLY STRL (GAUZE/BANDAGES/DRESSINGS) ×3 IMPLANT
GLOVE BIOGEL PI ORTHO PRO 7.5 (GLOVE) ×2
GLOVE PI ORTHO PRO STRL 7.5 (GLOVE) ×1 IMPLANT
GOWN STRL REUS W/ TWL LRG LVL3 (GOWN DISPOSABLE) ×2 IMPLANT
GOWN STRL REUS W/TWL LRG LVL3 (GOWN DISPOSABLE) ×4
KIT TURNOVER KIT A (KITS) ×3 IMPLANT
NS IRRIG 1000ML POUR BTL (IV SOLUTION) ×3 IMPLANT
PACK C SECTION AR (MISCELLANEOUS) ×3 IMPLANT
PAD DRESSING TELFA 3X8 NADH (GAUZE/BANDAGES/DRESSINGS) ×1 IMPLANT
PAD OB MATERNITY 4.3X12.25 (PERSONAL CARE ITEMS) ×3 IMPLANT
PAD PREP 24X41 OB/GYN DISP (PERSONAL CARE ITEMS) ×3 IMPLANT
PENCIL SMOKE ULTRAEVAC 22 CON (MISCELLANEOUS) ×3 IMPLANT
RETRACTOR WND ALEXIS-O 25 LRG (MISCELLANEOUS) ×1 IMPLANT
RTRCTR WOUND ALEXIS O 25CM LRG (MISCELLANEOUS) ×3
SPONGE LAP 18X18 RF (DISPOSABLE) ×3 IMPLANT
SUT VIC AB 0 CTX 36 (SUTURE) ×4
SUT VIC AB 0 CTX36XBRD ANBCTRL (SUTURE) ×2 IMPLANT
SUT VIC AB 1 CT1 36 (SUTURE) ×6 IMPLANT
SUT VICRYL+ 3-0 36IN CT-1 (SUTURE) ×6 IMPLANT

## 2019-08-05 NOTE — Anesthesia Preprocedure Evaluation (Signed)
Anesthesia Evaluation  Patient identified by MRN, date of birth, ID band Patient awake    Reviewed: Allergy & Precautions, NPO status , Patient's Chart, lab work & pertinent test results  History of Anesthesia Complications Negative for: history of anesthetic complications  Airway Mallampati: II  TM Distance: >3 FB Neck ROM: Full    Dental no notable dental hx.    Pulmonary asthma (childhood) , neg sleep apnea, neg COPD,    breath sounds clear to auscultation- rhonchi (-) wheezing      Cardiovascular Exercise Tolerance: Good (-) hypertension(-) CAD, (-) Past MI, (-) Cardiac Stents and (-) CABG  Rhythm:Regular Rate:Normal - Systolic murmurs and - Diastolic murmurs    Neuro/Psych neg Seizures negative neurological ROS  negative psych ROS   GI/Hepatic negative GI ROS, Neg liver ROS,   Endo/Other  negative endocrine ROSneg diabetes  Renal/GU Renal disease: hx of nephrolithiasis.     Musculoskeletal negative musculoskeletal ROS (+)   Abdominal (+) - obese, Gravid abdomen   Peds  Hematology  (+) anemia ,   Anesthesia Other Findings Past Medical History: No date: Anemia No date: History of kidney stones No date: Kidney stones     Comment:  kidney stones, cysts No date: Left genital labial abscess   Reproductive/Obstetrics (+) Pregnancy                             Lab Results  Component Value Date   WBC 10.8 (H) 08/05/2019   HGB 10.1 (L) 08/05/2019   HCT 31.5 (L) 08/05/2019   MCV 92.9 08/05/2019   PLT 186 08/05/2019    Anesthesia Physical Anesthesia Plan  ASA: II  Anesthesia Plan: Spinal   Post-op Pain Management:    Induction:   PONV Risk Score and Plan: 2 and Ondansetron  Airway Management Planned: Natural Airway  Additional Equipment:   Intra-op Plan:   Post-operative Plan:   Informed Consent: I have reviewed the patients History and Physical, chart, labs and  discussed the procedure including the risks, benefits and alternatives for the proposed anesthesia with the patient or authorized representative who has indicated his/her understanding and acceptance.     Dental advisory given  Plan Discussed with: CRNA and Anesthesiologist  Anesthesia Plan Comments:         Anesthesia Quick Evaluation

## 2019-08-05 NOTE — Anesthesia Procedure Notes (Deleted)
Spinal  Patient location during procedure: OR Start time: 08/05/2019 7:50 AM End time: 08/05/2019 7:54 AM Staffing Anesthesiologist: Emmie Niemann, MD Resident/CRNA: Justus Memory, CRNA Performed: resident/CRNA  Preanesthetic Checklist Completed: patient identified, site marked, surgical consent, pre-op evaluation, timeout performed, IV checked, risks and benefits discussed and monitors and equipment checked Spinal Block Patient position: sitting Prep: ChloraPrep Patient monitoring: heart rate, continuous pulse ox, blood pressure and cardiac monitor Approach: midline Location: L4-5 Injection technique: single-shot Needle Needle type: Introducer and Pencil-Tip  Needle gauge: 24 G Needle length: 9 cm Additional Notes Negative paresthesia. Negative blood return. Positive free-flowing CSF. Expiration date of kit checked and confirmed. Patient tolerated procedure well, without complications.

## 2019-08-05 NOTE — Interval H&P Note (Signed)
History and Physical Interval Note:  08/05/2019 7:44 AM  Tammy Ward  has presented today for surgery, with the diagnosis of HISTORY OF UTERINE RUPTURE.  The various methods of treatment have been discussed with the patient and family. After consideration of risks, benefits and other options for treatment, the patient has consented to  Procedure(s): REPEAT CESAREAN SECTION (N/A) as a surgical intervention.  The patient's history has been reviewed, patient examined, no change in status, stable for surgery.  I have reviewed the patient's chart and labs.  Questions were answered to the patient's satisfaction.     Jeannie Fend

## 2019-08-05 NOTE — Op Note (Signed)
° ° °    OP NOTE  Date: 08/05/2019   9:27 AM Name Tammy Ward MR# [id number]  Preoperative Diagnosis: 1. Intrauterine pregnancy at [redacted]w[redacted]d 2.  Previous uterine incision.  History of ruptured uterus and prior cesarean.  Postoperative Diagnosis: 1. Intrauterine pregnancy at [redacted]w[redacted]d, delivered 2. Viable infant 3. Remainder same as pre-op   Procedure: 1.  Repeat low-Transverse Cesarean Section  Surgeon: Finis Bud, MD  Assistant: Philip Aspen, CNM  Anesthesia: Spinal    EBL: 400 ml     Findings: 1) female infant, Apgar scores of 9   at 1 minute and 10   at 5 minutes and a birthweight of 119.58  ounces.    2) Normal uterus, tubes and ovaries.    Procedure:  The patient was prepped and draped in the supine position and placed under spinal anesthesia.  A transverse incision was made across the abdomen in a Pfannenstiel manner. If indicated the old scar was systematically removed with sharp dissection.  We carried the dissection down to the level of the fascia.  The fascia was incised in a curvilinear manner.  The fascia was then elevated from the rectus muscles with blunt and sharp dissection.  The rectus muscles were separated laterally exposing the peritoneum.  The peritoneum was carefully entered with care being taken to avoid bowel and bladder.  A self-retaining retractor was placed.  The visceral peritoneum was incised in a curvilinear fashion across the lower uterine segment creating a bladder flap. A transverse incision was made across the lower uterine segment and extended laterally and superiorly using the bandage scissors.  Artificial rupture membranes was performed and Clear fluid was noted.  The infant was delivered from the cephalic position.  A nuchal cord was present. After an appropriate time interval, the cord was doubly clamped and cut. Cord blood was obtained if required.  The infant was handed to the pediatric personnel  who then placed the infant under heat  lamps where it was cleaned dried and suctioned as needed. The placenta was delivered. The hysterotomy incision was then identified on ring forceps.  The uterine cavity was cleaned with a moist lap sponge.  The hysterotomy incision was closed with a running interlocking suture of Vicryl.  Hemostasis was excellent.  Pitocin was run in the IV and the uterus was found to be firm. The posterior cul-de-sac and gutters were cleaned and inspected.  Hemostasis was noted.  The fascia was then closed with a running suture of #1 Vicryl.  Hemostasis of the subcutaneous tissues was obtained using the Bovie.  The subcutaneous tissues were closed with a running suture of 000 Vicryl.  A subcuticular suture was placed.  Steri-Strips were applied in the usual manner.  A pressure dressing was placed.  The patient went to the recovery room in stable condition.   Finis Bud, M.D. 08/05/2019 9:27 AM

## 2019-08-05 NOTE — H&P (Signed)
History and Physical   HPI  Tammy Ward is a 23 y.o. G2P1001 at [redacted]w[redacted]d Estimated Date of Delivery: 08/26/19 who is being admitted for repeat cesarean delivery - h/o uterine rupture.    OB History  OB History  Gravida Para Term Preterm AB Living  2 1 1  0 0 1  SAB TAB Ectopic Multiple Live Births  0 0 0 0 1    # Outcome Date GA Lbr Len/2nd Weight Sex Delivery Anes PTL Lv  2 Current           1 Term 11/18/17 [redacted]w[redacted]d  3340 g M CS-LTranv Gen  LIV     Name: Tammy Ward     Apgar1: 7  Apgar5: 9    Obstetric Comments  Menstrual age: 45    Age 1st Pregnancy:NA    PROBLEM LIST  Pregnancy complications or risks: Patient Active Problem List   Diagnosis Date Noted  . Anemia in pregnancy 06/10/2019  . History of rupture of uterus 01/06/2019  . History of low transverse cesarean section 01/06/2019     Prenatal labs and studies: ABO, Rh: --/--/PENDING (08/14 0737) Antibody: PENDING (08/14 0654) Rubella: 2.68 (01/29 1046) RPR: Non Reactive (06/17 0937)  HBsAg: Negative (01/29 1046)  HIV: Non Reactive (01/29 1046)  TGG:YIRSWNIO (08/11 1500)   Past Medical History:  Diagnosis Date  . Anemia   . History of kidney stones   . Kidney stones    kidney stones, cysts  . Left genital labial abscess      Past Surgical History:  Procedure Laterality Date  . ABSCESS DRAINAGE  02-02-15   Labial   . CESAREAN SECTION N/A 11/18/2017   Procedure: CESAREAN SECTION;  Surgeon: Brayton Mars, MD;  Location: [facility] ORS;  Service: Obstetrics;  Laterality: N/A;  . KIDNEY STONE SURGERY  2014     Medications    Current Discharge Medication List    CONTINUE these medications which have NOT CHANGED   Details  Prenatal-DSS-FeCb-FeGl-FA (CITRANATAL BLOOM) 90-1 MG TABS Take 1 tablet by mouth daily. Qty: 30 tablet, Refills: 11         Allergies  Patient has no known allergies.  Review of Systems  Pertinent items are noted in HPI.  Physical Exam  BP (!) 110/53  (BP Location: Left Arm)   Pulse 83   Temp 98.5 F (36.9 C) (Oral)   Resp 16   Ht 4\' 11"  (1.499 m)   Wt 66.7 kg   LMP 11/19/2018 (Exact Date)   BMI 29.69 kg/m   Lungs:  CTA B Cardio: RRR without M/R/G Abd: Soft, gravid, NT Presentation: cephalic EXT: No C/C/ 1+ Edema DTRs: 2+ B CERVIX:  NA  See Prenatal records for more detailed PE.    FHR:  Variability: Good {> 6 bpm)   Test Results  Results for orders placed or performed during the hospital encounter of 08/05/19 (from the past 24 hour(s))  CBC     Status: Abnormal   Collection Time: 08/05/19  6:54 AM  Result Value Ref Range   WBC 10.8 (H) 4.0 - 10.5 K/uL   RBC 3.39 (L) 3.87 - 5.11 MIL/uL   Hemoglobin 10.1 (L) 12.0 - 15.0 g/dL   HCT 31.5 (L) 36.0 - 46.0 %   MCV 92.9 80.0 - 100.0 fL   MCH 29.8 26.0 - 34.0 pg   MCHC 32.1 30.0 - 36.0 g/dL   RDW 15.1 11.5 - 15.5 %   Platelets 186 150 - 400 K/uL  nRBC 0.0 0.0 - 0.2 %  Type and screen     Status: None (Preliminary result)   Collection Time: 08/05/19  6:54 AM  Result Value Ref Range   ABO/RH(D) PENDING    Antibody Screen PENDING    Sample Expiration      08/08/2019,2359 Performed at [facility], [address] [place], [place] [address]      Assessment   G2P1001 at 9245w0d Estimated Date of Delivery: 08/26/19  The fetus is reassuring.    Patient Active Problem List   Diagnosis Date Noted  . Anemia in pregnancy 06/10/2019  . History of rupture of uterus 01/06/2019  . History of low transverse cesarean section 01/06/2019    Plan  1. Admit to L&D :    2. EFM: -- Category 1 3. Spinal anes 4. Admission labs  5.  Repeat CD  Tammy Ward, M.D. 08/05/2019 7:40 AM

## 2019-08-05 NOTE — Lactation Note (Signed)
This note was copied from a baby's chart. Lactation Consultation Note  Patient Name: Tammy Ward MVHQI'[id number] Date: 08/05/2019 Reason for consult: Initial assessment;1st time breastfeeding;Early term 37-38.6wks   Maternal Data Formula Feeding for Exclusion: No Does the patient have breastfeeding experience prior to this delivery?: Yes  Feeding Feeding Type: Breast Fed First feeding, already latched and nursing well when viewed, mom just stopped nursing first child in March  LATCH Score Latch: Grasps breast easily, tongue down, lips flanged, rhythmical sucking.  Audible Swallowing: A few with stimulation  Type of Nipple: Everted at rest and after stimulation  Comfort (Breast/Nipple): Soft / non-tender  Hold (Positioning): No assistance needed to correctly position infant at breast.  LATCH Score: 9  Interventions Interventions: Breast feeding basics reviewed;Skin to skin  Lactation Tools Discussed/Used  Encouraged skin to skin to encourage milk production and observation of feeding cues   Consult Status Consult Status: PRN    Ferol Luz 08/05/2019, 10:10 AM

## 2019-08-05 NOTE — Anesthesia Post-op Follow-up Note (Signed)
Anesthesia QCDR form completed.        

## 2019-08-05 NOTE — Lactation Note (Signed)
This note was copied from a baby's chart. Lactation Consultation Note  Patient Name: Tammy Ward [id number] Date: 08/05/2019 Reason for consult: Follow-up assessment BAby has not fed since 1340, attempt by mom at 63, baby placed skin to skin with mom, no feeding cues noted at present, left baby skin to skin with mom upright and baby on chest with head turned to side, mom to look for cues   Maternal Data Formula Feeding for Exclusion: No  Feeding Feeding Type: Breast Fed  LATCH Score Latch: Too sleepy or reluctant, no latch achieved, no sucking elicited.                 Interventions Interventions: Skin to skin  Lactation Tools Discussed/Used     Consult Status Consult Status: Follow-up Date: 08/06/19 Follow-up type: In-patient    Tammy Ward 08/05/2019, 6:41 PM

## 2019-08-05 NOTE — Transfer of Care (Signed)
Immediate Anesthesia Transfer of Care Note  Patient: Tammy Ward  Procedure(s) Performed: REPEAT CESAREAN SECTION (N/A )  Patient Location: PACU  Anesthesia Type:Spinal  Level of Consciousness: awake, alert  and oriented  Airway & Oxygen Therapy: Patient Spontanous Breathing  Post-op Assessment: Report given to RN and Post -op Vital signs reviewed and stable  Post vital signs: Reviewed and stable  Last Vitals:  Vitals Value Taken Time  BP 116/104 08/05/19 0920  Temp    Pulse 67 08/05/19 0922  Resp 21 08/05/19 0922  SpO2 100 % 08/05/19 0922  Vitals shown include unvalidated device data.  Last Pain:  Vitals:   08/05/19 0707  TempSrc:   PainSc: 0-No pain         Complications: No apparent anesthesia complications

## 2019-08-05 NOTE — Anesthesia Procedure Notes (Addendum)
Spinal  Patient location during procedure: OR Start time: 08/05/2019 7:50 AM End time: 08/05/2019 7:55 AM Staffing Resident/CRNA: Justus Memory, CRNA Performed: resident/CRNA  Preanesthetic Checklist Completed: patient identified, site marked, surgical consent, pre-op evaluation, timeout performed, IV checked, risks and benefits discussed and monitors and equipment checked Spinal Block Patient position: sitting Prep: Betadine Patient monitoring: heart rate, continuous pulse ox, blood pressure and cardiac monitor Approach: midline Location: L4-5 Injection technique: single-shot Needle Needle type: Introducer and Pencan  Needle gauge: 24 G Needle length: 9 cm Additional Notes Negative paresthesia. Negative blood return. Positive free-flowing CSF. Expiration date of kit checked and confirmed. Patient tolerated procedure well, without complications.

## 2019-08-06 MED ORDER — COCONUT OIL OIL
1.0000 "application " | TOPICAL_OIL | Status: DC | PRN
  Filled 2019-08-06: qty 120

## 2019-08-06 NOTE — Lactation Note (Signed)
This note was copied from a baby's chart. Lactation Consultation Note  Patient Name: Tammy Ward [id number] Date: 08/06/2019   Observed mom breast feeding.  Mom latches DJ independently and he begins strong rhythmic sucking.  Mom breast fed last baby for 1 1/2 years.  She stopped when her breasts and nipples became so sore from this present pregnancy.  Mom requested Symphony set up in room.  Encouraged mom to breast feed first.  Reviewed pumping, cleaning, collection, storage, labeling and handling of breast milk.  Reviewed feeding cues, supply and demand, newborn stomach size, normal course of lactation and routine newborn feeding patterns.  Lactation name and number written on white board and encouraged to call with any questions, concerns or assistance.  Maternal Data    Feeding Feeding Type: Breast Fed  LATCH Score                   Interventions    Lactation Tools Discussed/Used     Consult Status      Jarold Motto 08/06/2019, 3:30 PM

## 2019-08-06 NOTE — Progress Notes (Signed)
Patient ID: Tammy Ward, female   DOB: February 08, 1996, 23 y.o.   MRN: [id number]    Progress Note - Cesarean Delivery  Tammy Ward is a 23 y.o. [phone] now PP day 1 s/p C-Section, Low Transverse .   Subjective:  Patient reports no problems with eating, bowel movements, voiding, or their wound  Pain controlled.  OOB without problem.    Objective:  Vital signs in last 24 hours: Temp:  [97.8 F (36.6 C)-98.6 F (37 C)] 97.8 F (36.6 C) (08/15 0804) Pulse Rate:  [55-94] 69 (08/15 0804) Resp:  [15-26] 18 (08/15 0804) BP: (93-121)/(51-76) 108/76 (08/15 0804) SpO2:  [94 %-100 %] 100 % (08/15 0804)  Physical Exam:  General: alert, cooperative and no distress Lochia: appropriate Uterine Fundus: firm Incision: dressing intact     Data Review Recent Labs    08/05/19 0654  HGB 10.1*  HCT 31.5*    Assessment:  Active Problems:   Cesarean delivery delivered   Status post Cesarean section. Doing well postoperatively.   Plan:       Continue current care.  Plan for DC tomorrow  Finis Bud, M.D. 08/06/2019 9:55 AM

## 2019-08-07 MED ORDER — NORETHINDRONE 0.35 MG PO TABS: 1.0000 | ORAL_TABLET | Freq: Every day | ORAL | 11 refills | Status: DC

## 2019-08-07 MED ORDER — DOCUSATE SODIUM 100 MG PO CAPS: 100.0000 mg | ORAL_CAPSULE | Freq: Two times a day (BID) | ORAL | 2 refills | Status: DC

## 2019-08-07 MED ORDER — IBUPROFEN 800 MG PO TABS: 800.0000 mg | ORAL_TABLET | Freq: Three times a day (TID) | ORAL | 0 refills | Status: DC

## 2019-08-07 MED ORDER — OXYCODONE-ACETAMINOPHEN 5-325 MG PO TABS: 1.0000 | ORAL_TABLET | ORAL | 0 refills | Status: DC | PRN

## 2019-08-07 NOTE — Progress Notes (Signed)
Pt discharged with infant.  Discharge instructions, prescriptions and follow up appointment given to and reviewed with pt. Pt verbalized understanding. Escorted out by staff. 

## 2019-08-07 NOTE — Final Progress Note (Signed)
Discharge Day SOAP Note:  Subjective:  The patient has no complaints.  She is ambulating well. She is taking PO well. Pain is well controlled with current medications. Patient is urinating without difficulty.  She is passing flatus.    Objective  Vital signs in last 24 hours: BP 112/73 (BP Location: Left Arm)   Pulse 67   Temp 98.7 F (37.1 C) (Oral)   Resp 18   Ht 4\' 11"  (1.499 m)   Wt 66.7 kg   LMP 11/19/2018 (Exact Date)   SpO2 98% Comment: Room Air  Breastfeeding Unknown   BMI 29.69 kg/m   Physical Exam: Gen: NAD Lungs clear bilaterally Heart: RRR Abdomen:  clean, dry, no drainage Fundus Fundal Tone: Firm  Lochia Amount: Scant     Data Review Labs: CBC Latest Ref Rng & Units 08/05/2019 06/08/2019 02/08/2019  WBC 4.0 - 10.5 K/uL 10.8(H) 14.9(H) 6.7  Hemoglobin 12.0 - 15.0 g/dL 10.1(L) 9.7(L) 12.4  Hematocrit 36.0 - 46.0 % 31.5(L) 29.2(L) 36.3  Platelets 150 - 400 K/uL 186 195 236   O POS  Assessment:  Active Problems:   Cesarean delivery delivered   Doing well.  Normal progress as expected.    Plan:  Discharge to home  Modified rest as directed - may slowly resume normal activities with restrictions  as discussed.  Medications as written.  See below for additional.       Discharge Instructions: Per After Visit Summary. Activity: Advance as tolerated. Pelvic rest for 6 weeks.  Also refer to After Visit Summary.  Wound care discussed. Diet: Regular Medications: Allergies as of 08/07/2019   No Known Allergies     Medication List    TAKE these medications   CitraNatal Bloom 90-1 MG Tabs Take 1 tablet by mouth daily.   docusate sodium 100 MG capsule Commonly known as: Colace Take 1 capsule (100 mg total) by mouth 2 (two) times daily.   ibuprofen 800 MG tablet Commonly known as: ADVIL Take 1 tablet (800 mg total) by mouth every 8 (eight) hours.   norethindrone 0.35 MG tablet Commonly known as: MICRONOR Take 1 tablet (0.35 mg total) by mouth  daily. Start 4 weeks postpartum   oxyCODONE-acetaminophen 5-325 MG tablet Commonly known as: PERCOCET/ROXICET Take 1-2 tablets by mouth every 4 (four) hours as needed for moderate pain.      Outpatient follow up: 1 week in office for incision check with Philip Aspen, CNM  Postpartum contraception: POP start 4 wks pp.  Discharged Condition: good  Discharged to: home  Newborn Data: Disposition:home with mother  Apgars: APGAR (1 MIN): 9   APGAR (5 MINS): 10   APGAR (10 MINS):    Baby Feeding: Breast  Philip Aspen, CNM  08/07/2019 9:55 AM

## 2019-08-07 NOTE — Discharge Summary (Signed)
   Obstetric Discharge Summary  Patient ID: Tammy Ward MRN: [id number] DOB/AGE: 1996-05-26 23 y.o.   Date of Admission: 08/05/2019  Date of Discharge: 08/07/2019  Admitting Diagnosis:Repeat Scheduled cesarean section at [redacted]w[redacted]d  Mode of Delivery:        low uterine, transverse     Discharge Diagnosis: No other diagnosis and Reasons for cesarean section  repeat.   Intrapartum Procedures: spinal   Post partum procedures: none  Complications: none                        Discharge Day SOAP Note:  Subjective:  The patient has no complaints.  She is ambulating well. She is taking PO well. Pain is well controlled with current medications. Patient is urinating without difficulty.  She is passing flatus.    Objective  Vital signs in last 24 hours: BP 112/73 (BP Location: Left Arm)   Pulse 67   Temp 98.7 F (37.1 C) (Oral)   Resp 18   Ht 4\' 11"  (1.499 m)   Wt 66.7 kg   LMP 11/19/2018 (Exact Date)   SpO2 98% Comment: Room Air  Breastfeeding Unknown   BMI 29.69 kg/m   Physical Exam: Gen: NAD Lungs clear bilaterally Heart: RRR Abdomen:  clean, dry, no drainage Fundus Fundal Tone: Firm  Lochia Amount: Scant     Data Review Labs: CBC Latest Ref Rng & Units 08/05/2019 06/08/2019 02/08/2019  WBC 4.0 - 10.5 K/uL 10.8(H) 14.9(H) 6.7  Hemoglobin 12.0 - 15.0 g/dL 10.1(L) 9.7(L) 12.4  Hematocrit 36.0 - 46.0 % 31.5(L) 29.2(L) 36.3  Platelets 150 - 400 K/uL 186 195 236   O POS  Assessment:  Active Problems:   Cesarean delivery delivered   Doing well.  Normal progress as expected.    Plan:  Discharge to home  Modified rest as directed - may slowly resume normal activities with restrictions  as discussed.  Medications as written.  See below for additional.       Discharge Instructions: Per After Visit Summary. Activity: Advance as tolerated. Pelvic rest for 6 weeks.  Also refer to After Visit Summary.  Wound care discussed. Diet:  Regular Medications: Allergies as of 08/07/2019   No Known Allergies     Medication List    TAKE these medications   CitraNatal Bloom 90-1 MG Tabs Take 1 tablet by mouth daily.   docusate sodium 100 MG capsule Commonly known as: Colace Take 1 capsule (100 mg total) by mouth 2 (two) times daily.   ibuprofen 800 MG tablet Commonly known as: ADVIL Take 1 tablet (800 mg total) by mouth every 8 (eight) hours.   norethindrone 0.35 MG tablet Commonly known as: MICRONOR Take 1 tablet (0.35 mg total) by mouth daily. Start 4 weeks postpartum   oxyCODONE-acetaminophen 5-325 MG tablet Commonly known as: PERCOCET/ROXICET Take 1-2 tablets by mouth every 4 (four) hours as needed for moderate pain.      Outpatient follow up: 1 week in office for incision check with Philip Aspen, CNM  Postpartum contraception: POP start 4 wks pp.  Discharged Condition: good  Discharged to: home  Newborn Data: Disposition:home with mother  Apgars: APGAR (1 MIN): 9   APGAR (5 MINS): 10   APGAR (10 MINS):    Baby Feeding: Breast  Philip Aspen, CNM  08/07/2019 9:55 AM

## 2019-08-07 NOTE — Discharge Instructions (Signed)
Breastfeeding Tips for a Good Latch Latching is how your baby's mouth attaches to your nipple to breastfeed. It is an important part of breastfeeding. Your baby may have trouble latching for a number of reasons. A poor latch may cause you to have cracked or sore nipples or other problems. Follow these instructions at home: How to position your baby  Find a comfortable place to sit or lie down. Your neck and back should be well supported.  If you are seated, place a pillow or rolled-up blanket under your baby. This will bring him or her to the level of your breast.  Make sure that your baby's belly (abdomen) is facing your belly.  Try different positions to find one that works best for you and your baby. How to help your baby latch   To start, gently rub your breast. Move your fingertips in a circle as you massage from your chest wall toward your nipple. This helps milk flow. Keep doing this during feeding if needed.  Position your breast. Hold your breast with four fingers underneath and your thumb above your nipple. Keep your fingers away from your nipple and your baby's mouth. Follow these steps to help your baby latch: 1. Rub your baby's lips gently with your finger or nipple. 2. When your baby's mouth is open wide enough, quickly bring your baby to your breast and place your whole nipple into your baby's mouth. Place as much of the colored area around your nipple (areola)as possible into your baby's mouth. 3. Your baby's tongue should be between his or her lower gum and your breast. 4. You should be able to see more areola above your baby's upper lip than below the lower lip. 5. When your baby starts sucking, you will feel a gentle pull on your nipple. You should not feel any pain. Be patient. It is common for a baby to suck for about 2-3 minutes to start the flow of breast milk. 6. Make sure that your baby's mouth is in the right position around your nipple. Your baby's lips should make  a seal on your breast and be turned outward.  General instructions  Look for these signs that your baby has latched on to your nipple: ? The baby is quietly tugging or sucking without causing you pain. ? You hear the baby swallow after every 3 or 4 sucks. ? You see movement above and in front of the baby's ears while he or she is sucking.  Be aware of these signs that your baby has not latched on to your nipple: ? The baby makes sucking sounds or smacking sounds while feeding. ? You have nipple pain.  If your baby is not latched well, put your little finger between your baby's gums and your nipple. This will break the seal. Then try to help your baby latch again.  If you keep having problems, get help from a breastfeeding specialist (lactation consultant). Contact a doctor if:  You have cracking or soreness in your nipples that lasts longer than 1 week.  You have nipple pain.  Your breasts are filled with too much milk (engorgement), and this does not improve after 48-72 hours.  You have a plugged milk duct and a fever.  You follow the tips for a good latch but you keep having problems or concerns.  You have a pus-like fluid coming from your breast.  Your baby is not gaining weight.  Your baby loses weight. Summary  Latching is how your   baby's mouth attaches to your nipple to breastfeed.  Try different positions for breastfeeding to find one that works best for you and your baby.  A poor latch may cause you to have cracked or sore nipples or other problems. This information is not intended to replace advice given to you by your health care provider. Make sure you discuss any questions you have with your health care provider. Document Released: 07/15/2017 Document Revised: 03/30/2019 Document Reviewed: 07/15/2017 Elsevier Patient Education  2020 [place]. Postpartum Care After Cesarean Delivery This sheet gives you information about how to care for yourself from the  time you deliver your baby to up to 6-12 weeks after delivery (postpartum period). Your health care provider may also give you more specific instructions. If you have problems or questions, contact your health care provider. Follow these instructions at home: Medicines  Take over-the-counter and prescription medicines only as told by your health care provider.  If you were prescribed an antibiotic medicine, take it as told by your health care provider. Do not stop taking the antibiotic even if you start to feel better.  Ask your health care provider if the medicine prescribed to you: ? Requires you to avoid driving or using heavy machinery. ? Can cause constipation. You may need to take actions to prevent or treat constipation, such as:  Drink enough fluid to keep your urine pale yellow.  Take over-the-counter or prescription medicines.  Eat foods that are high in fiber, such as beans, whole grains, and fresh fruits and vegetables.  Limit foods that are high in fat and processed sugars, such as fried or sweet foods. Activity  Gradually return to your normal activities as told by your health care provider.  Avoid activities that take a lot of effort and energy (are strenuous) until approved by your health care provider. Walking at a slow to moderate pace is usually safe. Ask your health care provider what activities are safe for you. ? Do not lift anything that is heavier than your baby or 10 lb (4.5 kg) as told by your health care provider. ? Do not vacuum, climb stairs, or drive a car for as long as told by your health care provider.  If possible, have someone help you at home until you are able to do your usual activities yourself.  Rest as much as possible. Try to rest or take naps while your baby is sleeping. Vaginal bleeding  It is normal to have vaginal bleeding (lochia) after delivery. Wear a sanitary pad to absorb vaginal bleeding and discharge. ? During the first week after  delivery, the amount and appearance of lochia is often similar to a menstrual period. ? Over the next few weeks, it will gradually decrease to a dry, yellow-brown discharge. ? For most women, lochia stops completely by 4-6 weeks after delivery. Vaginal bleeding can vary from woman to woman.  Change your sanitary pads frequently. Watch for any changes in your flow, such as: ? A sudden increase in volume. ? A change in color. ? Large blood clots.  If you pass a blood clot, save it and call your health care provider to discuss. Do not flush blood clots down the toilet before you get instructions from your health care provider.  Do not use tampons or douches until your health care provider says this is safe.  If you are not breastfeeding, your period should return 6-8 weeks after delivery. If you are breastfeeding, your period may return anytime between 8  weeks after delivery and the time that you stop breastfeeding. Perineal care   If your C-section (Cesarean section) was unplanned, and you were allowed to labor and push before delivery, you may have pain, swelling, and discomfort of the tissue between your vaginal opening and your anus (perineum). You may also have an incision in the tissue (episiotomy) or the tissue may have torn during delivery. Follow these instructions as told by your health care provider: ? Keep your perineum clean and dry as told by your health care provider. Use medicated pads and pain-relieving sprays and creams as directed. ? If you have an episiotomy or vaginal tear, check the area every day for signs of infection. Check for:  Redness, swelling, or pain.  Fluid or blood.  Warmth.  Pus or a bad smell. ? You may be given a squirt bottle to use instead of wiping to clean the perineum area after you go to the bathroom. As you start healing, you may use the squirt bottle before wiping yourself. Make sure to wipe gently. ? To relieve pain caused by an episiotomy,  vaginal tear, or hemorrhoids, try taking a warm sitz bath 2-3 times a day. A sitz bath is a warm water bath that is taken while you are sitting down. The water should only come up to your hips and should cover your buttocks. Breast care  Within the first few days after delivery, your breasts may feel heavy, full, and uncomfortable (breast engorgement). You may also have milk leaking from your breasts. Your health care provider can suggest ways to help relieve breast discomfort. Breast engorgement should go away within a few days.  If you are breastfeeding: ? Wear a bra that supports your breasts and fits you well. ? Keep your nipples clean and dry. Apply creams and ointments as told by your health care provider. ? You may need to use breast pads to absorb milk leakage. ? You may have uterine contractions every time you breastfeed for several weeks after delivery. Uterine contractions help your uterus return to its normal size. ? If you have any problems with breastfeeding, work with your health care provider or a Science writer.  If you are not breastfeeding: ? Avoid touching your breasts as this can make your breasts produce more milk. ? Wear a well-fitting bra and use cold packs to help with swelling. ? Do not squeeze out (express) milk. This causes you to make more milk. Intimacy and sexuality  Ask your health care provider when you can engage in sexual activity. This may depend on your: ? Risk of infection. ? Healing rate. ? Comfort and desire to engage in sexual activity.  You are able to get pregnant after delivery, even if you have not had your period. If desired, talk with your health care provider about methods of family planning or birth control (contraception). Lifestyle  Do not use any products that contain nicotine or tobacco, such as cigarettes, e-cigarettes, and chewing tobacco. If you need help quitting, ask your health care provider.  Do not drink alcohol,  especially if you are breastfeeding. Eating and drinking   Drink enough fluid to keep your urine pale yellow.  Eat high-fiber foods every day. These may help prevent or relieve constipation. High-fiber foods include: ? Whole grain cereals and breads. ? Brown rice. ? Beans. ? Fresh fruits and vegetables.  Take your prenatal vitamins until your postpartum checkup or until your health care provider tells you it is okay to stop. General  instructions  Keep all follow-up visits for you and your baby as told by your health care provider. Most women visit their health care provider for a postpartum checkup within the first 3-6 weeks after delivery. Contact a health care provider if you:  Feel unable to cope with the changes that a new baby brings to your life, and these feelings do not go away.  Feel unusually sad or worried.  Have breasts that are painful, hard, or turn red.  Have a fever.  Have trouble holding urine or keeping urine from leaking.  Have little or no interest in activities you used to enjoy.  Have not breastfed at all and you have not had a menstrual period for 12 weeks after delivery.  Have stopped breastfeeding and you have not had a menstrual period for 12 weeks after you stopped breastfeeding.  Have questions about caring for yourself or your baby.  Pass a blood clot from your vagina. Get help right away if you:  Have chest pain.  Have difficulty breathing.  Have sudden, severe leg pain.  Have severe pain or cramping in your abdomen.  Bleed from your vagina so much that you fill more than one sanitary pad in one hour. Bleeding should not be heavier than your heaviest period.  Develop a severe headache.  Faint.  Have blurred vision or spots in your vision.  Have a bad-smelling vaginal discharge.  Have thoughts about hurting yourself or your baby. If you ever feel like you may hurt yourself or others, or have thoughts about taking your own life,  get help right away. You can go to your nearest emergency department or call:  Your local emergency services (911 in the [place]  A suicide crisis helpline, such as the National Suicide Prevention Lifeline at 762-690-03891-8011028336. This is open 24 hours a day. Summary  The period of time from when you deliver your baby to up to 6-12 weeks after delivery is called the postpartum period.  Gradually return to your normal activities as told by your health care provider.  Keep all follow-up visits for you and your baby as told by your health care provider. This information is not intended to replace advice given to you by your health care provider. Make sure you discuss any questions you have with your health care provider. Document Released: 12/05/2000 Document Revised: 07/28/2018 Document Reviewed: 07/28/2018 Elsevier Patient Education  2020 [employer]

## 2019-08-08 LAB — GC/CHLAMYDIA PROBE AMP
Chlamydia trachomatis, NAA: NEGATIVE
Neisseria Gonorrhoeae by PCR: NEGATIVE

## 2019-08-08 NOTE — Anesthesia Postprocedure Evaluation (Signed)
Anesthesia Post Note  Patient: Tammy Ward  Procedure(s) Performed: REPEAT CESAREAN SECTION (N/A )  Patient location during evaluation: PACU Anesthesia Type: Spinal Level of consciousness: oriented and awake and alert Pain management: pain level controlled Vital Signs Assessment: post-procedure vital signs reviewed and stable Respiratory status: spontaneous breathing, respiratory function stable and nonlabored ventilation Cardiovascular status: blood pressure returned to baseline and stable Postop Assessment: no headache, no backache and spinal receding Anesthetic complications: no     Last Vitals:  Vitals:   08/07/19 0833 08/07/19 0837  BP:  112/73  Pulse:  67  Resp: 17 18  Temp:  37.1 C  SpO2:  98%    Last Pain:  Vitals:   08/07/19 0837  TempSrc: Oral  PainSc:                  Jamair Cato

## 2019-08-11 ENCOUNTER — Encounter: Payer: Self-pay | Admitting: Obstetrics and Gynecology

## 2019-08-11 ENCOUNTER — Other Ambulatory Visit: Payer: Self-pay

## 2019-08-11 ENCOUNTER — Ambulatory Visit (INDEPENDENT_AMBULATORY_CARE_PROVIDER_SITE_OTHER): Payer: Medicaid Other | Admitting: Obstetrics and Gynecology

## 2019-08-11 VITALS — BP 118/82 | HR 86 | Ht 59.0 in | Wt 138.0 lb

## 2019-08-11 DIAGNOSIS — Z9889 Other specified postprocedural states: Secondary | ICD-10-CM

## 2019-08-11 NOTE — Progress Notes (Signed)
HPI:      Ms. Tammy Ward is a 23 y.o. [phone] who LMP was No LMP recorded.  Subjective:   She presents today just over a week from cesarean delivery.  She reports no issues.  She is breast-feeding full-time.  Ambulating voiding bowel movements without difficulty.  Patient denies pain.  She says "this was so much easier than my last cesarean".    Hx: The following portions of the patient's history were reviewed and updated as appropriate:             She  has a past medical history of Anemia, History of kidney stones, Kidney stones, and Left genital labial abscess. She does not have any pertinent problems on file. She  has a past surgical history that includes Kidney stone surgery (2014); Abscess drainage (02-02-15); Cesarean section (N/A, 11/18/2017); and Cesarean section (N/A, 08/05/2019). Her family history includes Asthma in her sister; Thyroid disease in her sister. She  reports that she has never smoked. She has never used smokeless tobacco. She reports that she does not drink alcohol or use drugs. She has a current medication list which includes the following prescription(s): docusate sodium, ibuprofen, norethindrone, and citranatal bloom. She has No Known Allergies.       Review of Systems:  Review of Systems  Constitutional: Denied constitutional symptoms, night sweats, recent illness, fatigue, fever, insomnia and weight loss.  Eyes: Denied eye symptoms, eye pain, photophobia, vision change and visual disturbance.  Ears/Nose/Throat/Neck: Denied ear, nose, throat or neck symptoms, hearing loss, nasal discharge, sinus congestion and sore throat.  Cardiovascular: Denied cardiovascular symptoms, arrhythmia, chest pain/pressure, edema, exercise intolerance, orthopnea and palpitations.  Respiratory: Denied pulmonary symptoms, asthma, pleuritic pain, productive sputum, cough, dyspnea and wheezing.  Gastrointestinal: Denied, gastro-esophageal reflux, melena, nausea and vomiting.   Genitourinary: Denied genitourinary symptoms including symptomatic vaginal discharge, pelvic relaxation issues, and urinary complaints.  Musculoskeletal: Denied musculoskeletal symptoms, stiffness, swelling, muscle weakness and myalgia.  Dermatologic: Denied dermatology symptoms, rash and scar.  Neurologic: Denied neurology symptoms, dizziness, headache, neck pain and syncope.  Psychiatric: Denied psychiatric symptoms, anxiety and depression.  Endocrine: Denied endocrine symptoms including hot flashes and night sweats.   Meds:   Current Outpatient Medications on File Prior to Visit  Medication Sig Dispense Refill  . docusate sodium (COLACE) 100 MG capsule Take 1 capsule (100 mg total) by mouth 2 (two) times daily. 60 capsule 2  . ibuprofen (ADVIL) 800 MG tablet Take 1 tablet (800 mg total) by mouth every 8 (eight) hours. 30 tablet 0  . norethindrone (MICRONOR) 0.35 MG tablet Take 1 tablet (0.35 mg total) by mouth daily. Start 4 weeks postpartum 1 Package 11  . Prenatal-DSS-FeCb-FeGl-FA (CITRANATAL BLOOM) 90-1 MG TABS Take 1 tablet by mouth daily. 30 tablet 11   No current facility-administered medications on file prior to visit.     Objective:     Vitals:   08/11/19 0918  BP: 118/82  Pulse: 86               Abdomen: Soft.  Non-tender.  No masses.  No HSM.  Incision/s: Intact.  Healing well.  No erythema.  No drainage.      Assessment:    [id number] Patient Active Problem List   Diagnosis Date Noted  . Cesarean delivery delivered 08/05/2019  . Anemia in pregnancy 06/10/2019  . History of rupture of uterus 01/06/2019  . History of low transverse cesarean section 01/06/2019     1. Post-operative state  Patient with excellent recovery   Plan:            1.  Patient may resume most normal activities with exception of heavy lifting and intercourse. Orders No orders of the defined types were placed in this encounter.   No orders of the defined types were placed in  this encounter.     F/U  Return in about 5 weeks (around 09/15/2019).  Finis Bud, M.D. 08/11/2019 9:25 AM

## 2019-09-16 ENCOUNTER — Other Ambulatory Visit: Payer: Self-pay

## 2019-09-16 ENCOUNTER — Encounter: Payer: Self-pay | Admitting: Certified Nurse Midwife

## 2019-09-16 ENCOUNTER — Ambulatory Visit (INDEPENDENT_AMBULATORY_CARE_PROVIDER_SITE_OTHER): Payer: Self-pay | Admitting: Certified Nurse Midwife

## 2019-09-16 NOTE — Progress Notes (Signed)
Subjective:    Tammy Ward is a 23 y.o. G32P2002 Caucasian female who presents for a postpartum visit. She is 6 weeks postpartum following a repeat cesarean section, low transverse incision at 37 gestational weeks. Anesthesia: spinal. I have fully reviewed the prenatal and intrapartum course.  Postpartum course has been uncomplicated. Baby's course has been uncomplicated. Baby is feeding by breast. Bleeding no bleeding. Bowel function is normal. Bladder function is normal.   Patient is sexually active. Contraception method is oral progesterone-only contraceptive. Postpartum depression screening: negative. Score 0.  Last pap 01/2019 and was normal.  Denies difficulty breathing or respiratory distress, chest pain, abdominal pain, excessive vaginal bleeding, dysuria, and leg pain or swelling.   The following portions of the patient's history were reviewed and updated as appropriate: allergies, current medications, past medical history, past surgical history and problem list.  Review of Systems  Pertinent items are noted in HPI.   Objective:   BP 103/77   Pulse 77   Ht 4\' 11"  (1.499 m)   Wt 130 lb 1 oz (59 kg)   Breastfeeding Yes   BMI 26.27 kg/m   General:  alert, cooperative and no distress   Breasts:  deferred, no complaints  Lungs: clear to auscultation bilaterally  Heart:  regular rate and rhythm  Abdomen: soft, nontender   Vulva: normal  Vagina: normal vagina  Cervix:  closed  Corpus: Well-involuted  Adnexa:  Non-palpable     Depression screen [facility] 2/9 09/16/2019 12/31/2017 03/20/2017 12/19/2015  Decreased Interest 0 0 0 0  Down, Depressed, Hopeless 0 0 0 0  PHQ - 2 Score 0 0 0 0  Altered sleeping 0 3 - -  Tired, decreased energy 0 0 - -  Change in appetite 0 1 - -  Feeling bad or failure about yourself  0 0 - -  Trouble concentrating 0 0 - -  Moving slowly or fidgety/restless 0 0 - -  Suicidal thoughts 0 0 - -  PHQ-9 Score 0 4 - -  Difficult doing work/chores -  Not difficult at all - -       Assessment:   Postpartum exam Six (6) wks s/p repeat cesarean section Breastfeeding Depression screening Contraception counseling   Plan:   May return to work without restriction.   Encouraged routine health maintenance techniques.   Reviewed red flag symptoms and when to call.   Follow up in: 5 months for ANNUAL EXAM or earlier if needed.   Diona Fanti, CNM [facility], [facility] 09/16/19 3:37 PM

## 2019-09-16 NOTE — Patient Instructions (Signed)
Breastfeeding Tips for a Good Latch Latching is how your baby's mouth attaches to your nipple to breastfeed. It is an important part of breastfeeding. Your baby may have trouble latching for a number of reasons. A poor latch may cause you to have cracked or sore nipples or other problems. Follow these instructions at home: How to position your baby  Find a comfortable place to sit or lie down. Your neck and back should be well supported.  If you are seated, place a pillow or rolled-up blanket under your baby. This will bring him or her to the level of your breast.  Make sure that your baby's belly (abdomen) is facing your belly.  Try different positions to find one that works best for you and your baby. How to help your baby latch   To start, gently rub your breast. Move your fingertips in a circle as you massage from your chest wall toward your nipple. This helps milk flow. Keep doing this during feeding if needed.  Position your breast. Hold your breast with four fingers underneath and your thumb above your nipple. Keep your fingers away from your nipple and your baby's mouth. Follow these steps to help your baby latch: 1. Rub your baby's lips gently with your finger or nipple. 2. When your baby's mouth is open wide enough, quickly bring your baby to your breast and place your whole nipple into your baby's mouth. Place as much of the colored area around your nipple (areola)as possible into your baby's mouth. 3. Your baby's tongue should be between his or her lower gum and your breast. 4. You should be able to see more areola above your baby's upper lip than below the lower lip. 5. When your baby starts sucking, you will feel a gentle pull on your nipple. You should not feel any pain. Be patient. It is common for a baby to suck for about 2-3 minutes to start the flow of breast milk. 6. Make sure that your baby's mouth is in the right position around your nipple. Your baby's lips should make  a seal on your breast and be turned outward.  General instructions  Look for these signs that your baby has latched on to your nipple: ? The baby is quietly tugging or sucking without causing you pain. ? You hear the baby swallow after every 3 or 4 sucks. ? You see movement above and in front of the baby's ears while he or she is sucking.  Be aware of these signs that your baby has not latched on to your nipple: ? The baby makes sucking sounds or smacking sounds while feeding. ? You have nipple pain.  If your baby is not latched well, put your little finger between your baby's gums and your nipple. This will break the seal. Then try to help your baby latch again.  If you keep having problems, get help from a breastfeeding specialist (Science writer). Contact a doctor if:  You have cracking or soreness in your nipples that lasts longer than 1 week.  You have nipple pain.  Your breasts are filled with too much milk (engorgement), and this does not improve after 48-72 hours.  You have a plugged milk duct and a fever.  You follow the tips for a good latch but you keep having problems or concerns.  You have a pus-like fluid coming from your breast.  Your baby is not gaining weight.  Your baby loses weight. Summary  Latching is how your  baby's mouth attaches to your nipple to breastfeed.  Try different positions for breastfeeding to find one that works best for you and your baby.  A poor latch may cause you to have cracked or sore nipples or other problems. This information is not intended to replace advice given to you by your health care provider. Make sure you discuss any questions you have with your health care provider. Document Released: 07/15/2017 Document Revised: 03/30/2019 Document Reviewed: 07/15/2017 Elsevier Patient Education  2020 [place] 62-83 Years Old, Female Preventive care refers to visits with your health care provider and  lifestyle choices that can promote health and wellness. This includes:  A yearly physical exam. This may also be called an annual well check.  Regular dental visits and eye exams.  Immunizations.  Screening for certain conditions.  Healthy lifestyle choices, such as eating a healthy diet, getting regular exercise, not using drugs or products that contain nicotine and tobacco, and limiting alcohol use. What can I expect for my preventive care visit? Physical exam Your health care provider will check your:  Height and weight. This may be used to calculate body mass index (BMI), which tells if you are at a healthy weight.  Heart rate and blood pressure.  Skin for abnormal spots. Counseling Your health care provider may ask you questions about your:  Alcohol, tobacco, and drug use.  Emotional well-being.  Home and relationship well-being.  Sexual activity.  Eating habits.  Work and work Statistician.  Method of birth control.  Menstrual cycle.  Pregnancy history. What immunizations do I need?  Influenza (flu) vaccine  This is recommended every year. Tetanus, diphtheria, and pertussis (Tdap) vaccine  You may need a Td booster every 10 years. Varicella (chickenpox) vaccine  You may need this if you have not been vaccinated. Human papillomavirus (HPV) vaccine  If recommended by your health care provider, you may need three doses over 6 months. Measles, mumps, and rubella (MMR) vaccine  You may need at least one dose of MMR. You may also need a second dose. Meningococcal conjugate (MenACWY) vaccine  One dose is recommended if you are age 54-21 years and a first-year college student living in a residence hall, or if you have one of several medical conditions. You may also need additional booster doses. Pneumococcal conjugate (PCV13) vaccine  You may need this if you have certain conditions and were not previously vaccinated. Pneumococcal polysaccharide (PPSV23)  vaccine  You may need one or two doses if you smoke cigarettes or if you have certain conditions. Hepatitis A vaccine  You may need this if you have certain conditions or if you travel or work in places where you may be exposed to hepatitis A. Hepatitis B vaccine  You may need this if you have certain conditions or if you travel or work in places where you may be exposed to hepatitis B. Haemophilus influenzae type b (Hib) vaccine  You may need this if you have certain conditions. You may receive vaccines as individual doses or as more than one vaccine together in one shot (combination vaccines). Talk with your health care provider about the risks and benefits of combination vaccines. What tests do I need?  Blood tests  Lipid and cholesterol levels. These may be checked every 5 years starting at age 49.  Hepatitis C test.  Hepatitis B test. Screening  Diabetes screening. This is done by checking your blood sugar (glucose) after you have not eaten for a while (fasting).  Sexually transmitted disease (STD) testing.  BRCA-related cancer screening. This may be done if you have a family history of breast, ovarian, tubal, or peritoneal cancers.  Pelvic exam and Pap test. This may be done every 3 years starting at age 40. Starting at age 89, this may be done every 5 years if you have a Pap test in combination with an HPV test. Talk with your health care provider about your test results, treatment options, and if necessary, the need for more tests. Follow these instructions at home: Eating and drinking   Eat a diet that includes fresh fruits and vegetables, whole grains, lean protein, and low-fat dairy.  Take vitamin and mineral supplements as recommended by your health care provider.  Do not drink alcohol if: ? Your health care provider tells you not to drink. ? You are pregnant, may be pregnant, or are planning to become pregnant.  If you drink alcohol: ? Limit how much you have  to 0-1 drink a day. ? Be aware of how much alcohol is in your drink. In the [place] one drink equals one 12 oz bottle of beer (355 mL), one 5 oz glass of wine (148 mL), or one 1 oz glass of hard liquor (44 mL). Lifestyle  Take daily care of your teeth and gums.  Stay active. Exercise for at least 30 minutes on 5 or more days each week.  Do not use any products that contain nicotine or tobacco, such as cigarettes, e-cigarettes, and chewing tobacco. If you need help quitting, ask your health care provider.  If you are sexually active, practice safe sex. Use a condom or other form of birth control (contraception) in order to prevent pregnancy and STIs (sexually transmitted infections). If you plan to become pregnant, see your health care provider for a preconception visit. What's next?  Visit your health care provider once a year for a well check visit.  Ask your health care provider how often you should have your eyes and teeth checked.  Stay up to date on all vaccines. This information is not intended to replace advice given to you by your health care provider. Make sure you discuss any questions you have with your health care provider. Document Released: 02/03/2002 Document Revised: 08/19/2018 Document Reviewed: 08/19/2018 Elsevier Patient Education  2020 [employer].

## 2019-12-19 ENCOUNTER — Ambulatory Visit: Payer: Medicaid Other | Attending: Internal Medicine

## 2019-12-19 DIAGNOSIS — R238 Other skin changes: Secondary | ICD-10-CM

## 2019-12-19 DIAGNOSIS — U071 COVID-19: Secondary | ICD-10-CM

## 2019-12-21 LAB — NOVEL CORONAVIRUS, NAA: SARS-CoV-2, NAA: NOT DETECTED

## 2020-02-17 ENCOUNTER — Ambulatory Visit (INDEPENDENT_AMBULATORY_CARE_PROVIDER_SITE_OTHER): Payer: Medicaid Other | Admitting: Certified Nurse Midwife

## 2020-02-17 ENCOUNTER — Encounter: Payer: Self-pay | Admitting: Certified Nurse Midwife

## 2020-02-17 ENCOUNTER — Other Ambulatory Visit: Payer: Self-pay

## 2020-02-17 VITALS — BP 99/58 | HR 80 | Ht 59.0 in | Wt 122.0 lb

## 2020-02-17 DIAGNOSIS — Z23 Encounter for immunization: Secondary | ICD-10-CM | POA: Diagnosis not present

## 2020-02-17 DIAGNOSIS — Z98891 History of uterine scar from previous surgery: Secondary | ICD-10-CM

## 2020-02-17 DIAGNOSIS — Z3009 Encounter for other general counseling and advice on contraception: Secondary | ICD-10-CM

## 2020-02-17 DIAGNOSIS — O99345 Other mental disorders complicating the puerperium: Secondary | ICD-10-CM | POA: Diagnosis not present

## 2020-02-17 DIAGNOSIS — N941 Unspecified dyspareunia: Secondary | ICD-10-CM

## 2020-02-17 DIAGNOSIS — F418 Other specified anxiety disorders: Secondary | ICD-10-CM

## 2020-02-17 DIAGNOSIS — Z01419 Encounter for gynecological examination (general) (routine) without abnormal findings: Secondary | ICD-10-CM | POA: Diagnosis not present

## 2020-02-17 MED ORDER — PAROXETINE HCL 10 MG PO TABS: 10.0000 mg | ORAL_TABLET | Freq: Every day | ORAL | 1 refills | Status: DC

## 2020-02-17 NOTE — Progress Notes (Signed)
Patient here for annual exam.  Patient c/o forgetting to take OCP, currently breast feeding.  Patient had first post partum menstrual period this month.

## 2020-02-17 NOTE — Patient Instructions (Addendum)
Preventive Care 21-22 Years Old, Female Preventive care refers to visits with your health care provider and lifestyle choices that can promote health and wellness. This includes:  A yearly physical exam. This may also be called an annual well check.  Regular dental visits and eye exams.  Immunizations.  Screening for certain conditions.  Healthy lifestyle choices, such as eating a healthy diet, getting regular exercise, not using drugs or products that contain nicotine and tobacco, and limiting alcohol use. What can I expect for my preventive care visit? Physical exam Your health care provider will check your:  Height and weight. This may be used to calculate body mass index (BMI), which tells if you are at a healthy weight.  Heart rate and blood pressure.  Skin for abnormal spots. Counseling Your health care provider may ask you questions about your:  Alcohol, tobacco, and drug use.  Emotional well-being.  Home and relationship well-being.  Sexual activity.  Eating habits.  Work and work environment.  Method of birth control.  Menstrual cycle.  Pregnancy history. What immunizations do I need?  Influenza (flu) vaccine  This is recommended every year. Tetanus, diphtheria, and pertussis (Tdap) vaccine  You may need a Td booster every 10 years. Varicella (chickenpox) vaccine  You may need this if you have not been vaccinated. Human papillomavirus (HPV) vaccine  If recommended by your health care provider, you may need three doses over 6 months. Measles, mumps, and rubella (MMR) vaccine  You may need at least one dose of MMR. You may also need a second dose. Meningococcal conjugate (MenACWY) vaccine  One dose is recommended if you are age 19-21 years and a first-year college student living in a residence hall, or if you have one of several medical conditions. You may also need additional booster doses. Pneumococcal conjugate (PCV13) vaccine  You may need  this if you have certain conditions and were not previously vaccinated. Pneumococcal polysaccharide (PPSV23) vaccine  You may need one or two doses if you smoke cigarettes or if you have certain conditions. Hepatitis A vaccine  You may need this if you have certain conditions or if you travel or work in places where you may be exposed to hepatitis A. Hepatitis B vaccine  You may need this if you have certain conditions or if you travel or work in places where you may be exposed to hepatitis B. Haemophilus influenzae type b (Hib) vaccine  You may need this if you have certain conditions. You may receive vaccines as individual doses or as more than one vaccine together in one shot (combination vaccines). Talk with your health care provider about the risks and benefits of combination vaccines. What tests do I need?  Blood tests  Lipid and cholesterol levels. These may be checked every 5 years starting at age 20.  Hepatitis C test.  Hepatitis B test. Screening  Diabetes screening. This is done by checking your blood sugar (glucose) after you have not eaten for a while (fasting).  Sexually transmitted disease (STD) testing.  BRCA-related cancer screening. This may be done if you have a family history of breast, ovarian, tubal, or peritoneal cancers.  Pelvic exam and Pap test. This may be done every 3 years starting at age 21. Starting at age 30, this may be done every 5 years if you have a Pap test in combination with an HPV test. Talk with your health care provider about your test results, treatment options, and if necessary, the need for more tests.   Follow these instructions at home: Eating and drinking   Eat a diet that includes fresh fruits and vegetables, whole grains, lean protein, and low-fat dairy.  Take vitamin and mineral supplements as recommended by your health care provider.  Do not drink alcohol if: ? Your health care provider tells you not to drink. ? You are  pregnant, may be pregnant, or are planning to become pregnant.  If you drink alcohol: ? Limit how much you have to 0-1 drink a day. ? Be aware of how much alcohol is in your drink. In the [place] one drink equals one 12 oz bottle of beer (355 mL), one 5 oz glass of wine (148 mL), or one 1 oz glass of hard liquor (44 mL). Lifestyle  Take daily care of your teeth and gums.  Stay active. Exercise for at least 30 minutes on 5 or more days each week.  Do not use any products that contain nicotine or tobacco, such as cigarettes, e-cigarettes, and chewing tobacco. If you need help quitting, ask your health care provider.  If you are sexually active, practice safe sex. Use a condom or other form of birth control (contraception) in order to prevent pregnancy and STIs (sexually transmitted infections). If you plan to become pregnant, see your health care provider for a preconception visit. What's next?  Visit your health care provider once a year for a well check visit.  Ask your health care provider how often you should have your eyes and teeth checked.  Stay up to date on all vaccines. This information is not intended to replace advice given to you by your health care provider. Make sure you discuss any questions you have with your health care provider. Document Revised: 08/19/2018 Document Reviewed: 08/19/2018 Elsevier Patient Education  2020 [employer].  Contraceptive Barrier Methods A barrier method is a type of birth control (contraception) that is used to prevent pregnancy. Barrier methods include:  Female condom.  Female condom.  Diaphragm.  Cervical cap.  Sponge.  Spermicide. Your health care provider can help you decide what form of contraception is best for you. Always keep in mind that the risk of an STI (sexually transmitted infection) exists even when a contraceptive barrier method is used. Female condom  A female condom is a thin sheath that is worn over the penis during  sex. Condoms prevent pregnancy by catching and stopping sperm from reaching the uterus. They also help to protect against STIs. Some condoms come with a sperm-killing substance (spermicide) on them. Female condoms are made of latex, rubber, or a type of plastic called polyurethane. Condoms that are made of latex and polyurethane provide the best protection against many STIs, including HIV (human immunodeficiency virus). Female condoms can only be worn once. They should not be used with oil-based lubricants like petroleum jelly, lotions, or oils, because these lubricants make them less effective. They can be used with water-based lubricants available from your health care provider and over-the-counter. Water-based lubricants do not contain silicone, wax, or oil. Female condom  The female condom is a soft, loose-fitting sheath that is put into the vagina before sex. It is held in place by two closed inner rings, one at the cervix and the other at the vaginal opening. The female condom prevents pregnancy by catching sperm and blocking its passage to the uterus. It also helps to protect against STIs. A female condom is intended for one-time use only. It can be inserted as long as 8 hours before sex.  You should not use a female condom while your partner uses a female condom because the condoms can stick to each other and break. Diaphragm  A diaphragm is a soft, latex or silicone dome-shaped barrier that is placed in the vagina with spermicidal jelly before sex. A diaphragm covers the cervix, kills sperm, and blocks the passage of sperm into the cervix. It does not protect against STIs. This barrier method requires a prescription and must be fitted by a health care provider. A diaphragm can be inserted up to 2 hours before sex. If it is inserted more than 2 hours before sex, the spermicide must be applied again. A diaphragm should be left in the vagina for 6-8 hours after sex. Before sex can occur again during these 6  hours, spermicide must be reapplied. A diaphragm should not be used during a menstrual period. Cervical cap  A cervical cap is a round, soft, latex or plastic cup that is put in the vagina and fits over the cervix. It stays in place by the use of suction. A cervical cap should be used with a spermicide. It provides continuous protection as long as it is in place, regardless of how many times you have sex. It does not protect against STIs. Cervical caps may be inserted as long as 6 hours before sexual activity. They must be left in place for at least 6 hours after sex and can be left in place for as long as 48 hours. A cervical cap must be fitted by a health care provider. If you lose or gain a certain amount of weight your health care provider may need to refit the cap. You cannot use a cervical cap during your period. Sponge  A sponge is a soft, circular piece of polyurethane foam that has spermicide in it. It is made wet with clean water and then placed into the vagina and over the cervix before sex. The foam is designed to trap and absorb sperm before it enters the cervix while the spermicide kills or immobilizes sperm. A sponge offers an immediate and continuous presence of spermicide throughout a 24-hour period no matter how many times you have sex. It does not protect against STIs. A sponge should be left in place for at least 6 hours after sex. It should not be left in for more than 24 hours, and it cannot be reused. It has a loop for removal. This barrier method can be purchased over-the-counter. You may use it if you are breastfeeding. Do not use if you have your period. Spermicide Spermicides are chemicals that kill or block sperm from entering the cervix and uterus. They are inserted into the vagina with an applicator before sex. Spermicides do not protect against STIs. Spermicides come as creams, jellies, suppositories, foam, film, or tablets. Suppositories, film, and tablets should be  inserted 10-30 minutes before sex so they can dissolve. To be effective, a new spermicide must be inserted every time you have sex. Summary  A barrier method is a type of birth control (contraception) that is used to prevent pregnancy.  Your health care provider can help you decide what form of contraception is best for you.  Always keep in mind that the risk of an STI (sexually transmitted infection) exists even when a contraceptive barrier method is used. This information is not intended to replace advice given to you by your health care provider. Make sure you discuss any questions you have with your health care provider. Document Revised: 11/20/2017  Document Reviewed: 10/27/2016 Elsevier Patient Education  [place]. Lactic acid; Citric acid; Potassium bitartrate vaginal gel What is this medicine? LACTIC ACID; CITRIC ACID; POTASSIUM BITARTRATE (LAK tuhk AS id; SIH trik AS id; poe TASS ee um bi TAAR treit) is used for birth control when you have sexual intercourse to help prevent pregnancy. This medicine may be used for other purposes; ask your health care provider or pharmacist if you have questions. COMMON BRAND NAME(S): PHEXXI What should I tell my health care provider before I take this medicine? They need to know if you have any of these conditions:  frequent kidney or urinary tract infections  HIV or AIDS  pelvic or vaginal infection  sexually transmitted disease, like herpes, gonorrhea, or chlamydia  an unusual or allergic reaction to lactic acid, citric acid, potassium bitartrate, other medicines, foods, dyes, or preservatives  pregnant or trying to get pregnant  breast-feeding How should I use this medicine? This medicine is for use in the vagina only. Follow the directions on the prescription label. Wash hands before and after use. To prevent pregnancy, it is very important that this product is used properly. This product must be applied before sexual contact  begins. Do not use it in the rectum. Make sure you carefully read and follow the instructions that come with the product. If you have vaginal sex more than 1 time within 1 hour, you must use a new applicator. Use exactly as directed. Talk to your pediatrician regarding the use of this medicine in children. Special care may be needed. Overdosage: If you think you have taken too much of this medicine contact a poison control center or emergency room at once. NOTE: This medicine is only for you. Do not share this medicine with others. What if I miss a dose? Use before each time you have sexual intercourse as directed on the label. If you miss a dose, you may become pregnant. What may interact with this medicine? Interactions are not expected. This birth control may be used with other medicines used in the vagina to treat infections including miconazole, metronidazole, and tioconazole. If you have questions ask your doctor or pharmacist. Do not use any other vaginal products at the same time without talking to your health care professional. This list may not describe all possible interactions. Give your health care provider a list of all the medicines, herbs, non-prescription drugs, or dietary supplements you use. Also tell them if you smoke, drink alcohol, or use illegal drugs. Some items may interact with your medicine. What should I watch for while using this medicine? This product does not protect you against HIV infection (AIDS) or other sexually transmitted diseases. This product may be used at any time of your menstrual cycle. This product may be used as soon as your healthcare provider tells you it is safe for you to have sex after childbirth, abortion, or miscarriage. This product may be used with most hormonal birth control methods; a vaginal diaphragm; or latex, polyurethane and polyisoprene condoms. Avoid using this product with contraceptive vaginal rings. What side effects may I notice from  receiving this medicine? Side effects that you should report to your doctor or health care professional as soon as possible:  allergic reactions like skin rash, itching or hives, swelling  signs and symptoms of infection like fever; chills; pelvic pain; cloudy urine; pain or trouble passing urine  vaginal discharge, itching or odor  vaginal irritation or burning Side effects that usually do not require  medical attention (report these to your doctor or health care professional if they continue or are bothersome):  mild vaginal irritation This list may not describe all possible side effects. Call your doctor for medical advice about side effects. You may report side effects to FDA at 1-800-FDA-1088. Where should I keep my medicine? Keep out of the reach of children. Store at room temperature between 15 and 30 degrees C (59 and 86 degrees F). Keep in the foil pouch until ready for use. Follow the directions on the product label. Throw away any unused medicine after the expiration date. NOTE: This sheet is a summary. It may not cover all possible information. If you have questions about this medicine, talk to your doctor, pharmacist, or health care provider.  2020 Elsevier/Gold Standard (2019-05-19 09:34:36) Paroxetine tablets What is this medicine? PAROXETINE (pa ROX e teen) is used to treat depression. It may also be used to treat anxiety disorders, obsessive compulsive disorder, panic attacks, post traumatic stress, and premenstrual dysphoric disorder (PMDD). This medicine may be used for other purposes; ask your health care provider or pharmacist if you have questions. COMMON BRAND NAME(S): Paxil, Pexeva What should I tell my health care provider before I take this medicine? They need to know if you have any of these conditions:  bipolar disorder or a family history of bipolar disorder  bleeding disorders  glaucoma  heart disease  kidney disease  liver disease  low levels of  sodium in the blood  seizures  suicidal thoughts, plans, or attempt; a previous suicide attempt by you or a family member  take MAOIs like Carbex, Eldepryl, Marplan, Nardil, and Parnate  take medicines that treat or prevent blood clots  thyroid disease  an unusual or allergic reaction to paroxetine, other medicines, foods, dyes, or preservatives  pregnant or trying to get pregnant  breast-feeding How should I use this medicine? Take this medicine by mouth with a glass of water. Follow the directions on the prescription label. You can take it with or without food. Take your medicine at regular intervals. Do not take your medicine more often than directed. Do not stop taking this medicine suddenly except upon the advice of your doctor. Stopping this medicine too quickly may cause serious side effects or your condition may worsen. A special MedGuide will be given to you by the pharmacist with each prescription and refill. Be sure to read this information carefully each time. Talk to your pediatrician regarding the use of this medicine in children. Special care may be needed. Overdosage: If you think you have taken too much of this medicine contact a poison control center or emergency room at once. NOTE: This medicine is only for you. Do not share this medicine with others. What if I miss a dose? If you miss a dose, take it as soon as you can. If it is almost time for your next dose, take only that dose. Do not take double or extra doses. What may interact with this medicine? Do not take this medicine with any of the following medications:  linezolid  MAOIs like Carbex, Eldepryl, Marplan, Nardil, and Parnate  methylene blue (injected into a vein)  pimozide  thioridazine This medicine may also interact with the following medications:  alcohol  amphetamines  aspirin and aspirin-like medicines  atomoxetine  certain medicines for depression, anxiety, or psychotic  disturbances  certain medicines for irregular heart beat like propafenone, flecainide, encainide, and quinidine  certain medicines for migraine headache like almotriptan, eletriptan,  frovatriptan, naratriptan, rizatriptan, sumatriptan, zolmitriptan  cimetidine  digoxin  diuretics  fentanyl  fosamprenavir  furazolidone  isoniazid  lithium  medicines that treat or prevent blood clots like warfarin, enoxaparin, and dalteparin  medicines for sleep  NSAIDs, medicines for pain and inflammation, like ibuprofen or naproxen  phenobarbital  phenytoin  procarbazine  rasagiline  ritonavir  supplements like St. John's wort, kava kava, valerian  tamoxifen  tramadol  tryptophan This list may not describe all possible interactions. Give your health care provider a list of all the medicines, herbs, non-prescription drugs, or dietary supplements you use. Also tell them if you smoke, drink alcohol, or use illegal drugs. Some items may interact with your medicine. What should I watch for while using this medicine? Tell your doctor if your symptoms do not get better or if they get worse. Visit your doctor or health care professional for regular checks on your progress. Because it may take several weeks to see the full effects of this medicine, it is important to continue your treatment as prescribed by your doctor. Patients and their families should watch out for new or worsening thoughts of suicide or depression. Also watch out for sudden changes in feelings such as feeling anxious, agitated, panicky, irritable, hostile, aggressive, impulsive, severely restless, overly excited and hyperactive, or not being able to sleep. If this happens, especially at the beginning of treatment or after a change in dose, call your health care professional. Dennis Bast may get drowsy or dizzy. Do not drive, use machinery, or do anything that needs mental alertness until you know how this medicine affects you. Do  not stand or sit up quickly, especially if you are an older patient. This reduces the risk of dizzy or fainting spells. Alcohol may interfere with the effect of this medicine. Avoid alcoholic drinks. Your mouth may get dry. Chewing sugarless gum or sucking hard candy, and drinking plenty of water will help. Contact your doctor if the problem does not go away or is severe. What side effects may I notice from receiving this medicine? Side effects that you should report to your doctor or health care professional as soon as possible:  allergic reactions like skin rash, itching or hives, swelling of the face, lips, or tongue  anxious  black, tarry stools  changes in vision  confusion  elevated mood, decreased need for sleep, racing thoughts, impulsive behavior  eye pain  fast, irregular heartbeat  feeling faint or lightheaded, falls  feeling agitated, angry, or irritable  hallucination, loss of contact with reality  loss of balance or coordination  loss of memory  painful or prolonged erections  restlessness, pacing, inability to keep still  seizures  stiff muscles  suicidal thoughts or other mood changes  trouble sleeping  unusual bleeding or bruising  unusually weak or tired  vomiting Side effects that usually do not require medical attention (report to your doctor or health care professional if they continue or are bothersome):  change in appetite or weight  change in sex drive or performance  diarrhea  dizziness  dry mouth  increased sweating  indigestion, nausea  tired  tremors This list may not describe all possible side effects. Call your doctor for medical advice about side effects. You may report side effects to FDA at 1-800-FDA-1088. Where should I keep my medicine? Keep out of the reach of children. Store at room temperature between 15 and 30 degrees C (59 and 86 degrees F). Keep container tightly closed. Throw away any unused  medicine after  the expiration date. NOTE: This sheet is a summary. It may not cover all possible information. If you have questions about this medicine, talk to your doctor, pharmacist, or health care provider.  2020 Elsevier/Gold Standard (2016-05-10 15:50:32)  Perinatal Anxiety When a woman feels excessive tension or worry (anxiety) during pregnancy or during the first 12 months after she gives birth, she has a condition called perinatal anxiety. Anxiety can interfere with work, school, relationships, and other everyday activities. If it is not managed properly, it can also cause problems in the mother and her baby.  If you are pregnant and you have symptoms of an anxiety disorder, it is important to talk with your health care provider. What are the causes? The exact cause of this condition is not known. Hormonal changes during and after pregnancy may play a role in causing perinatal anxiety. What increases the risk? You are more likely to develop this condition if:  You have a personal or family history of depression, anxiety, or mood disorders.  You experience a stressful life event during pregnancy, such as the death of a loved one.  You have a lot of regular life stress, such as being a single parent.  You have thyroid problems. What are the signs or symptoms? Perinatal anxiety can be different for everyone. It may include:  Panic attacks (panic disorder). These are intense episodes of fear or discomfort that may also cause sweating, nausea, shortness of breath, or fear of dying. They usually last 5-15 minutes.  Reliving an upsetting (traumatic) event through distressing thoughts, dreams, or flashbacks (post-traumatic stress disorder, or PTSD).  Excessive worry about multiple problems (generalized anxiety disorder).  Fear and stress about leaving certain people or loved ones (separation anxiety).  Performing repetitive tasks (compulsions) to relieve stress or worry (obsessive compulsive  disorder, or OCD).  Fear of certain objects or situations (phobias).  Excessive worrying, such as a constant feeling that something bad is going to happen.  Inability to relax.  Difficulty concentrating.  Sleep problems.  Frequent nightmares or disturbing thoughts. How is this diagnosed? This condition is diagnosed based on a physical exam and mental evaluation. In some cases, your health care provider may use an anxiety screening tool. These tools include a list of questions that can help a health care provider diagnose anxiety. Your health care provider may refer you to a mental health expert who specializes in anxiety. How is this treated? This condition may be treated with:  Medicines. Your health care provider will only give you medicines that have been proven safe for pregnancy and breastfeeding.  Talk therapy with a mental health professional to help change your patterns of thinking (cognitive behavioral therapy).  Mindfulness-based stress reduction.  Other relaxation therapies, such as deep breathing or guided muscle relaxation.  Support groups. Follow these instructions at home: Lifestyle  Do not use any products that contain nicotine or tobacco, such as cigarettes and e-cigarettes. If you need help quitting, ask your health care provider.  Do not use alcohol when you are pregnant. After your baby is born, limit alcohol intake to no more than 1 drink a day. One drink equals 12 oz of beer, 5 oz of wine, or 1 oz of hard liquor.  Consider joining a support group for new mothers. Ask your health care provider for recommendations.  Take good care of yourself. Make sure you: ? Get plenty of sleep. If you are having trouble sleeping, talk with your health care provider. ?  Eat a healthy diet. This includes plenty of fruits and vegetables, whole grains, and lean proteins. ? Exercise regularly, as told by your health care provider. Ask your health care provider what exercises  are safe for you. General instructions  Take over-the-counter and prescription medicines only as told by your health care provider.  Talk with your partner or family members about your feelings during pregnancy. Share any concerns or fears that you may have.  Ask for help with tasks or chores when you need it. Ask friends and family members to provide meals, watch your children, or help with cleaning.  Keep all follow-up visits as told by your health care provider. This is important. Contact a health care provider if:  You (or people close to you) notice that you have any symptoms of anxiety or depression.  You have anxiety and your symptoms get worse.  You experience side effects from medicines, such as nausea or sleep problems. Get help right away if:  You feel like hurting yourself, your baby, or someone else. If you ever feel like you may hurt yourself or others, or have thoughts about taking your own life, get help right away. You can go to your nearest emergency department or call:  Your local emergency services (911 in the [place]  A suicide crisis helpline, such as the [facility] at 201-220-9517. This is open 24 hours a day. Summary  Perinatal anxiety is when a woman feels excessive tension or worry during pregnancy or during the first 12 months after she gives birth.  Perinatal anxiety may include panic attacks, post-traumatic stress disorder, separation anxiety, phobias, or generalized anxiety.  Perinatal anxiety can cause physical health problems in the mother and baby if not properly managed.  This condition is treated with medicines, talk therapy, stress reduction therapies, or a combination of two or more treatments.  Talk with your partner or family members about your concerns or fears. Do not be afraid to ask for help. This information is not intended to replace advice given to you by your health care provider. Make sure you discuss any  questions you have with your health care provider. Document Revised: 12/11/2017 Document Reviewed: 02/04/2017 Elsevier Patient Education  [place].

## 2020-02-18 DIAGNOSIS — N941 Unspecified dyspareunia: Secondary | ICD-10-CM | POA: Insufficient documentation

## 2020-02-18 NOTE — Progress Notes (Signed)
ANNUAL PREVENTATIVE CARE GYN  ENCOUNTER NOTE  Subjective:       Tammy Ward is a 24 y.o. [phone] female here for a routine annual gynecologic exam.  Current complaints: 1. Forgets to take POP daily 2. Mood swings-questions postpartum depression or anxiety  Denies difficulty breathing or respiratory distress, chest pain, abdominal pain, excessive vaginal bleeding, dysuria, and leg pain or swelling.    Gynecologic History  Patient's last menstrual period was 02/05/2020 (exact date).  Contraception: oral progesterone-only contraceptive  Last Pap: 01/2019. Results were: normal  Obstetric History  OB History  Gravida Para Term Preterm AB Living  2 2 2  0 0 2  SAB TAB Ectopic Multiple Live Births  0 0 0 0 2    # Outcome Date GA Lbr Len/2nd Weight Sex Delivery Anes PTL Lv  2 Term 08/05/19 [redacted]w[redacted]d  7 lb 7.6 oz (3.39 kg) M CS-LTranv Spinal  LIV  1 Term 11/18/17 [redacted]w[redacted]d  7 lb 5.8 oz (3.34 kg) M CS-LTranv Gen  LIV    Obstetric Comments  Menstrual age: 62    Age 1st Pregnancy:NA    Past Medical History:  Diagnosis Date  . Anemia   . History of kidney stones   . Kidney stones    kidney stones, cysts  . Left genital labial abscess     Past Surgical History:  Procedure Laterality Date  . ABSCESS DRAINAGE  02-02-15   Labial   . CESAREAN SECTION N/A 11/18/2017   Procedure: CESAREAN SECTION;  Surgeon: 11/20/2017, MD;  Location: [facility] ORS;  Service: Obstetrics;  Laterality: N/A;  . CESAREAN SECTION N/A 08/05/2019   Procedure: REPEAT CESAREAN SECTION;  Surgeon: 08/07/2019, MD;  Location: [facility] ORS;  Service: Obstetrics;  Laterality: N/A;  . KIDNEY STONE SURGERY  2014    Current Outpatient Medications on File Prior to Visit  Medication Sig Dispense Refill  . norethindrone (MICRONOR) 0.35 MG tablet Take 1 tablet (0.35 mg total) by mouth daily. Start 4 weeks postpartum 1 Package 11  . Prenatal-DSS-FeCb-FeGl-FA (CITRANATAL BLOOM) 90-1 MG TABS Take 1 tablet by  mouth daily. 30 tablet 11   No current facility-administered medications on file prior to visit.    No Known Allergies  Social History   Socioeconomic History  . Marital status: Single    Spouse name: Not on file  . Number of children: Not on file  . Years of education: Not on file  . Highest education level: Not on file  Occupational History  . Not on file  Tobacco Use  . Smoking status: Never Smoker  . Smokeless tobacco: Never Used  Substance and Sexual Activity  . Alcohol use: No    Alcohol/week: 0.0 standard drinks  . Drug use: No  . Sexual activity: Yes    Birth control/protection: Pill  Other Topics Concern  . Not on file  Social History Narrative  . Not on file   Social Determinants of Health   Financial Resource Strain:   . Difficulty of Paying Living Expenses: Not on file  Food Insecurity:   . Worried About 2015 in the Last Year: Not on file  . Ran Out of Food in the Last Year: Not on file  Transportation Needs:   . Lack of Transportation (Medical): Not on file  . Lack of Transportation (Non-Medical): Not on file  Physical Activity:   . Days of Exercise per Week: Not on file  . Minutes of Exercise per Session: Not on  file  Stress:   . Feeling of Stress : Not on file  Social Connections:   . Frequency of Communication with Friends and Family: Not on file  . Frequency of Social Gatherings with Friends and Family: Not on file  . Attends Religious Services: Not on file  . Active Member of Clubs or Organizations: Not on file  . Attends Banker Meetings: Not on file  . Marital Status: Not on file  Intimate Partner Violence:   . Fear of Current or Ex-Partner: Not on file  . Emotionally Abused: Not on file  . Physically Abused: Not on file  . Sexually Abused: Not on file    Family History  Problem Relation Age of Onset  . Asthma Sister   . Thyroid disease Sister   . Cancer Neg Hx   . Diabetes Neg Hx   . Heart disease Neg  Hx   . Breast cancer Neg Hx   . Ovarian cancer Neg Hx   . Colon cancer Neg Hx     The following portions of the patient's history were reviewed and updated as appropriate: allergies, current medications, past family history, past medical history, past social history, past surgical history and problem list.  Review of Systems  ROS negative except as noted above. Information obtained from patient.    Objective:   BP (!) 99/58   Pulse 80   Ht 4\' 11"  (1.499 m)   Wt 122 lb (55.3 kg)   LMP 02/05/2020 (Exact Date)   Breastfeeding Yes   BMI 24.64 kg/m    CONSTITUTIONAL: Well-developed, well-nourished female in no acute distress.   PSYCHIATRIC: Normal mood and affect. Normal behavior. Normal judgment and thought content.  NEUROLGIC: Alert and oriented to person, place, and time. Normal muscle tone coordination. No cranial nerve deficit noted.  HENT:  Normocephalic, atraumatic, External right and left ear normal.   EYES: Conjunctivae and EOM are normal. Pupils are equal and round.    NECK: Normal range of motion, supple, no masses.  Normal thyroid.   SKIN: Skin is warm and dry. No rash noted. Not diaphoretic. No erythema. No pallor.  CARDIOVASCULAR: Normal heart rate noted, regular rhythm, no murmur.  RESPIRATORY: Clear to auscultation bilaterally. Effort and breath sounds normal, no problems with respiration noted.  BREASTS: Symmetric in size. No masses, skin changes, nipple drainage, or lymphadenopathy. Lactating.   ABDOMEN: Soft, normal bowel sounds, no distention noted.  No tenderness, rebound or guarding.   PELVIC:  External Genitalia: Normal  Vagina: Normal  Cervix: Normal  Uterus: Normal  Adnexa: Normal   MUSCULOSKELETAL: Normal range of motion. No tenderness.  No cyanosis, clubbing, or edema.  2+ distal pulses.  LYMPHATIC: No Axillary, Supraclavicular, or Inguinal Adenopathy.  Depression screen [facility] 2/9 02/17/2020 09/16/2019 12/31/2017 03/20/2017 12/19/2015   Decreased Interest 1 0 0 0 0  Down, Depressed, Hopeless 2 0 0 0 0  PHQ - 2 Score 3 0 0 0 0  Altered sleeping 0 0 3 - -  Tired, decreased energy 1 0 0 - -  Change in appetite 3 0 1 - -  Feeling bad or failure about yourself  2 0 0 - -  Trouble concentrating 0 0 0 - -  Moving slowly or fidgety/restless 1 0 0 - -  Suicidal thoughts 1 0 0 - -  PHQ-9 Score 11 0 4 - -  Difficult doing work/chores Somewhat difficult - Not difficult at all - -   GAD 7 : Generalized Anxiety  Score 02/17/2020  Nervous, Anxious, on Edge 3  Control/stop worrying 1  Worry too much - different things 2  Trouble relaxing 3  Restless 0  Easily annoyed or irritable 3  Afraid - awful might happen 1  Total GAD 7 Score 13  Anxiety Difficulty Somewhat difficult    Assessment:   Annual gynecologic examination 24 y.o.   Contraception: Undecided, will contact if Rx needed   Normal BMI   Problem List Items Addressed This Visit      Other   History of low transverse cesarean section   Relevant Orders   Ambulatory referral to Physical Therapy   Dyspareunia, female   Relevant Orders   Ambulatory referral to Physical Therapy    Other Visit Diagnoses    Well woman exam    -  Primary   Relevant Orders   Ambulatory referral to Physical Therapy   Encounter for other general counseling or advice on contraception       Postpartum anxiety       Relevant Medications   PARoxetine (PAXIL) 10 MG tablet      Plan:   Pap: Not needed  Labs: Declined  Routine preventative health maintenance measures emphasized: Contraception-Barrier Methods, Spermicides, and Phexxi, Exercise/Diet/Weight control, Tobacco Warnings, Alcohol/Substance use risks, Stress Management and Peer Pressure Issues, see AVS  Rx Paxil, see orders; Information given for local counselors and patient encouraged to schedule appointment  Reviewed red flag symptoms and when to call  RTC x 4-6 weeks for televisit mood and medication check  RTC x 1  year for ANNUAL EXAM or sooner if needed   Diona Fanti, CNM [facility], [facility] 02/18/20 10:54 AM

## 2020-02-29 ENCOUNTER — Ambulatory Visit: Payer: Medicaid Other | Attending: Certified Nurse Midwife | Admitting: Physical Therapy

## 2020-03-07 ENCOUNTER — Ambulatory Visit: Payer: Medicaid Other | Admitting: Physical Therapy

## 2020-03-12 ENCOUNTER — Other Ambulatory Visit: Payer: Self-pay | Admitting: Certified Nurse Midwife

## 2020-03-14 ENCOUNTER — Encounter: Payer: Self-pay | Admitting: Physical Therapy

## 2020-03-21 ENCOUNTER — Encounter: Payer: Self-pay | Admitting: Physical Therapy

## 2020-03-26 ENCOUNTER — Encounter: Payer: Medicaid Other | Admitting: Certified Nurse Midwife

## 2020-03-28 ENCOUNTER — Encounter: Payer: Self-pay | Admitting: Physical Therapy

## 2020-04-04 ENCOUNTER — Encounter: Payer: Self-pay | Admitting: Physical Therapy

## 2020-04-11 ENCOUNTER — Encounter: Payer: Self-pay | Admitting: Physical Therapy

## 2020-04-18 ENCOUNTER — Encounter: Payer: Self-pay | Admitting: Physical Therapy

## 2020-10-22 ENCOUNTER — Ambulatory Visit (INDEPENDENT_AMBULATORY_CARE_PROVIDER_SITE_OTHER): Payer: Medicaid Other | Admitting: Certified Nurse Midwife

## 2020-10-22 ENCOUNTER — Other Ambulatory Visit: Payer: Self-pay

## 2020-10-22 ENCOUNTER — Encounter: Payer: Self-pay | Admitting: Certified Nurse Midwife

## 2020-10-22 VITALS — BP 101/74 | HR 84 | Ht 59.0 in | Wt 132.4 lb

## 2020-10-22 DIAGNOSIS — N926 Irregular menstruation, unspecified: Secondary | ICD-10-CM | POA: Diagnosis not present

## 2020-10-22 DIAGNOSIS — Z3201 Encounter for pregnancy test, result positive: Secondary | ICD-10-CM | POA: Diagnosis not present

## 2020-10-22 DIAGNOSIS — Z98891 History of uterine scar from previous surgery: Secondary | ICD-10-CM

## 2020-10-22 DIAGNOSIS — Z87828 Personal history of other (healed) physical injury and trauma: Secondary | ICD-10-CM

## 2020-10-22 LAB — POCT URINE PREGNANCY: Preg Test, Ur: POSITIVE — AB

## 2020-10-22 NOTE — Progress Notes (Signed)
GYN ENCOUNTER NOTE  Subjective:       Tammy Ward is a 24 y.o. [id number] female here for pregnancy confirmation.   Reports positive home pregnancy test and missed menses. Stopped taking Paxil a few months prior to pregnancy.   Endorses nausea without vomiting and occasional breast tenderness while breastfeeding.   Denies difficulty breathing or respiratory distress, chest pain, abdominal pain, excessive vaginal bleeding, dysuria, and leg pain or swelling.    Gynecologic History  Patient's last menstrual period was 09/05/2020 (exact date).  Gestational age: 98 weeks 5 days  Estimated date of birth: 06/12/2019  Contraception: none   Last Pap: 01/2019. Results were: normal  Obstetric History  OB History  Gravida Para Term Preterm AB Living  3 2 2  0 0 2  SAB TAB Ectopic Multiple Live Births  0 0 0 0 2    # Outcome Date GA Lbr Len/2nd Weight Sex Delivery Anes PTL Lv  3 Current           2 Term 08/05/19 [redacted]w[redacted]d  7 lb 7.6 oz (3.39 kg) M CS-LTranv Spinal  LIV  1 Term 11/18/17 [redacted]w[redacted]d  7 lb 5.8 oz (3.34 kg) M CS-LTranv Gen  LIV    Obstetric Comments  Menstrual age: 55    Age 1st Pregnancy:NA    Past Medical History:  Diagnosis Date  . Anemia   . Depression   . History of kidney stones   . Kidney stones    kidney stones, cysts  . Left genital labial abscess     Past Surgical History:  Procedure Laterality Date  . ABSCESS DRAINAGE  02-02-15   Labial   . CESAREAN SECTION N/A 11/18/2017   Procedure: CESAREAN SECTION;  Surgeon: 11/20/2017, MD;  Location: [facility] ORS;  Service: Obstetrics;  Laterality: N/A;  . CESAREAN SECTION N/A 08/05/2019   Procedure: REPEAT CESAREAN SECTION;  Surgeon: 08/07/2019, MD;  Location: [facility] ORS;  Service: Obstetrics;  Laterality: N/A;  . KIDNEY STONE SURGERY  2014    Current Outpatient Medications on File Prior to Visit  Medication Sig Dispense Refill  . Prenatal MV & Min w/FA-DHA (PRENATAL ADULT GUMMY/DHA/FA PO) Take by  mouth.    . Prenatal-DSS-FeCb-FeGl-FA (CITRANATAL BLOOM) 90-1 MG TABS Take 1 tablet by mouth daily. 30 tablet 11  . norethindrone (MICRONOR) 0.35 MG tablet Take 1 tablet (0.35 mg total) by mouth daily. Start 4 weeks postpartum 1 Package 11  . PARoxetine (PAXIL) 10 MG tablet TAKE 1 TABLET BY MOUTH EVERY DAY (Patient not taking: Reported on 10/22/2020) 90 tablet 1   No current facility-administered medications on file prior to visit.    No Known Allergies  Social History   Socioeconomic History  . Marital status: Single    Spouse name: Not on file  . Number of children: Not on file  . Years of education: Not on file  . Highest education level: Not on file  Occupational History  . Not on file  Tobacco Use  . Smoking status: Never Smoker  . Smokeless tobacco: Never Used  Vaping Use  . Vaping Use: Never used  Substance and Sexual Activity  . Alcohol use: Not Currently    Alcohol/week: 0.0 standard drinks  . Drug use: Never  . Sexual activity: Yes  Other Topics Concern  . Not on file  Social History Narrative  . Not on file   Social Determinants of Health   Financial Resource Strain:   . Difficulty of Paying Living Expenses:  Not on file  Food Insecurity:   . Worried About Programme researcher, broadcasting/film/video in the Last Year: Not on file  . Ran Out of Food in the Last Year: Not on file  Transportation Needs:   . Lack of Transportation (Medical): Not on file  . Lack of Transportation (Non-Medical): Not on file  Physical Activity:   . Days of Exercise per Week: Not on file  . Minutes of Exercise per Session: Not on file  Stress:   . Feeling of Stress : Not on file  Social Connections:   . Frequency of Communication with Friends and Family: Not on file  . Frequency of Social Gatherings with Friends and Family: Not on file  . Attends Religious Services: Not on file  . Active Member of Clubs or Organizations: Not on file  . Attends Banker Meetings: Not on file  . Marital  Status: Not on file  Intimate Partner Violence:   . Fear of Current or Ex-Partner: Not on file  . Emotionally Abused: Not on file  . Physically Abused: Not on file  . Sexually Abused: Not on file    Family History  Problem Relation Age of Onset  . Asthma Sister   . Thyroid disease Sister   . Cancer Neg Hx   . Diabetes Neg Hx   . Heart disease Neg Hx   . Breast cancer Neg Hx   . Ovarian cancer Neg Hx   . Colon cancer Neg Hx     The following portions of the patient's history were reviewed and updated as appropriate: allergies, current medications, past family history, past medical history, past social history, past surgical history and problem list.  Review of Systems  ROS negative except as noted above. Information obtained from patient.   Objective:   BP 101/74   Pulse 84   Ht 4\' 11"  (1.499 m)   Wt 132 lb 6.4 oz (60.1 kg)   LMP 09/05/2020 (Exact Date)   BMI 26.74 kg/m    CONSTITUTIONAL: Well-developed, well-nourished female in no acute distress.   PHYSICAL EXAM: Not indicated.   Recent Results (from the past 2160 hour(s))  POCT urine pregnancy     Status: Abnormal   Collection Time: 10/22/20  2:04 PM  Result Value Ref Range   Preg Test, Ur Positive (A) Negative    Assessment:   1. Missed menses  - POCT urine pregnancy - 13/01/21 OB LESS THAN 14 WEEKS WITH OB TRANSVAGINAL; Future  2. Positive pregnancy test  - US OB LESS THAN 14 WEEKS WITH OB TRANSVAGINAL; Future  3. History of low transverse cesarean section  - US OB LESS THAN 14 WEEKS WITH OB TRANSVAGINAL; Future  4. History of rupture of uterus  - US OB LESS THAN 14 WEEKS WITH OB TRANSVAGINAL; Future     Plan:   First trimester education, see AVS.   Reviewed red flag symptoms and when to call.   RTC x 2-3 weeks for dating/viability ultrasound & intake.   RTC x 5-6 weeks for NOB PE or sooner if needed.    [place], CNM [facility], [facility] 10/22/20 2:07 PM

## 2020-10-22 NOTE — Patient Instructions (Addendum)
Breastfeeding During Pregnancy Deciding whether to continue breastfeeding during a pregnancy is an individual choice. Breastfeeding during pregnancy is generally not risky. Your nursing child may naturally stop breastfeeding (wean) during your pregnancy. If you have problems during pregnancy, you may be advised to stop breastfeeding. Work with your health care provider to help decide if breastfeeding during pregnancy is right for you. What should I consider when deciding whether to breastfeed during pregnancy? When deciding whether to continue breastfeeding while you are pregnant, you may want to consider:  The age of your nursing child and his or her physical and emotional needs.  Any health concerns related to your pregnancy. It may not be safe to continue breastfeeding if you have certain problems, such as: ? Uterine pain or bleeding. ? A history of preterm labor and delivery. ? Problems gaining weight or losing weight during pregnancy. ? A history of cervical insufficiency. This is a condition in which the cervix begins to thin and soften before your due date.  Whether you have any problems associated with breastfeeding. Some common problems experienced when breastfeeding during pregnancy include: ? Nipple tenderness and breast soreness. ? Nausea. ? Discomfort while breastfeeding due to the growing belly. ? Fatigue. ? Reduced milk supply. This may mean having fewer feedings a day. ? Changes in how your milk tastes. ? Uterine contractions. Follow these instructions at home:   Keep an open mind about how your breastfeeding experience will be. Avoid setting rigid expectations for yourself. Your needs and your nursing child's needs are likely to change as your pregnancy progresses.  Make sure that you are gaining a healthy amount of weight and eating enough calories.  Eat a healthy diet that includes fresh fruits and vegetables, whole grains, lean meat, fish, eggs, beans, nuts, and  seeds, and low-fat dairy products.  Drink plenty of fluids so your urine is clear or pale yellow.  Work with your health care provider and your child's health care provider as needed.  Keep track of your nursing child's weight. If your nursing baby is younger than twelve months, the normal decrease of your milk supply that happens during pregnancy could keep your baby from getting all the milk he or she needs.  Keep track of your nursing child's daily wet diapers and bowel movements to make sure he or she is staying hydrated. Your baby should have 6-8 wet diapers and at least 3 stools each day.  Talk to your health care provider or lactation specialist before starting any new medications or supplements. This is to make sure that they are safe for both pregnancy and breastfeeding. Where to find more information  [employer]: http://www.llli.org Contact a health care provider if:  Your breasts become large and painful (engorged).  Your nursing child is urinating or having bowel movements less often than normal. Summary  Breastfeeding during pregnancy is generally not risky. However, if you have problems during pregnancy, you may be advised to stop breastfeeding.  During pregnancy, your milk supply naturally decreases. Your nursing child may wean himself or herself naturally during your pregnancy.  Keep track of your nursing child's weight, wet diapers, and stools to make sure that he or she is getting enough milk.  Keep an open mind about how your breastfeeding experience will be. Avoid setting rigid expectations for yourself. This information is not intended to replace advice given to you by your health care provider. Make sure you discuss any questions you have with your health care provider.  Document Revised: 04/14/2017 Document Reviewed: 12/09/2016 Elsevier Patient Education  [employer] Gain During Pregnancy, Adult A certain amount of  weight gain during pregnancy is normal and healthy. How much weight you should gain depends on your overall health and a measurement called BMI (body mass index). BMI is an estimate of your body fat based on your height and weight. You can use an Freight forward[employer] to figure out your BMI, or you can ask your health care provider to calculate it for you at your next visit. Your recommended pregnancy weight gain is based on your pre-pregnancy BMI. General guidelines for a healthy total weight gain during pregnancy are listed below. If your BMI at or before the start of your pregnancy is:  Less than 18.5 (underweight), you should gain 28-40 lb (13-18 kg).  18.5-24.9 (normal weight), you should gain 25-35 lb (11-16 kg).  25-29.9 (overweight), you should gain 15-25 lb (7-11 kg).  30 or higher (obese), you should gain 11-20 lb (5-9 kg). These ranges vary depending on your individual health. If you are carrying more than one baby (multiples), it may be safe to gain more weight than these recommendations. If you gain less weight than recommended, that may be safe as long as your baby is growing and developing normally. How can unhealthy weight gain affect me and my baby? Gaining too much weight during pregnancy can lead to pregnancy complications, such as:  A temporary form of diabetes that develops during pregnancy (gestational diabetes).  High blood pressure during pregnancy and protein in your urine (preeclampsia).  High blood pressure during pregnancy without protein in your urine (gestational hypertension).  Your baby having a high weight at birth, which may: ? Raise your risk of having a more difficult delivery or a surgical delivery (cesarean delivery, or C-section). ? Raise your child's risk of developing obesity during childhood. Not gaining enough weight can be life-threatening for your baby, and it may raise your baby's chances of:  Being born early (preterm).  Growing more slowly than  normal during pregnancy (growth restriction).  Having a low weight at birth. What actions can I take to gain a healthy amount of weight during pregnancy? General instructions  Keep track of your weight gain during pregnancy.  Take over-the-counter and prescription medicines only as told by your health care provider. Take all prenatal supplements as directed.  Keep all health care visits during pregnancy (prenatal visits). These visits are a good time to discuss your weight gain. Your health care provider will weigh you at each visit to make sure you are gaining a healthy amount of weight. Nutrition   Eat a balanced, nutrient-rich diet. Eat plenty of: ? Fruits and vegetables, such as berries and broccoli. ? Whole grains, such as millet, barley, whole-wheat breads and cereals, and oatmeal. ? Low-fat dairy products or non-dairy products such as almond milk or rice milk. ? Protein foods, such as lean meat, chicken, eggs, and legumes (such as peas, beans, soybeans, and lentils).  Avoid foods that are fried or have a lot of fat, salt (sodium), or sugar.  Drink enough fluid to keep your urine pale yellow.  Choose healthy snack and drink options when you are at work or on the go: ? Drink water. Avoid soda, sports drinks, and juices that have added sugar. ? Avoid drinks with caffeine, such as coffee and energy drinks. ? Eat snacks that are high in protein, such as nuts, protein bars, and low-fat yogurt. ?  Carry convenient snacks in your purse that do not need refrigeration, such as a pack of trail mix, an apple, or a granola bar.  If you need help improving your diet, work with a health care provider or a diet and nutrition specialist (dietitian). Activity   Exercise regularly, as told by your health care provider. ? If you were active before becoming pregnant, you may be able to continue your regular fitness activities. ? If you were not active before pregnancy, you may gradually build up  to exercising for 30 or more minutes on most days of the week. This may include walking, swimming, or yoga.  Ask your health care provider what activities are safe for you. Talk with your health care provider about whether you may need to be excused from certain school or work activities. Where to find more information Learn more about managing your weight gain during pregnancy from:  American Pregnancy Association: www.americanpregnancy.org  [place] Department of Agriculture pregnancy weight gain calculator: [employer] Summary  Too much weight gain during pregnancy can lead to complications for you and your baby.  Find out your pre-pregnancy BMI to determine how much weight gain is healthy for you.  Eat nutritious foods and stay active.  Keep all of your prenatal visits as told by your health care provider. This information is not intended to replace advice given to you by your health care provider. Make sure you discuss any questions you have with your health care provider. Document Revised: 08/31/2019 Document Reviewed: 08/28/2017 Elsevier Patient Education  [place].   Exercise During Pregnancy Exercise is an important part of being healthy for people of all ages. Exercise improves the function of your heart and lungs and helps you maintain strength, flexibility, and a healthy body weight. Exercise also boosts energy levels and elevates mood. Most women should exercise regularly during pregnancy. In rare cases, women with certain medical conditions or complications may be asked to limit or avoid exercise during pregnancy. How does this affect me? Along with maintaining general strength and flexibility, exercising during pregnancy can help:  Keep strength in muscles that are used during labor and childbirth.  Decrease low back pain.  Reduce symptoms of depression.  Control weight gain during pregnancy.  Reduce the risk of needing insulin if you develop  diabetes during pregnancy.  Decrease the risk of cesarean delivery.  Speed up your recovery after giving birth. How does this affect my baby? Exercise can help you have a healthy pregnancy. Exercise does not cause premature birth. It will not cause your baby to weigh less at birth. What exercises can I do? Many exercises are safe for you to do during pregnancy. Do a variety of exercises that safely increase your heart and breathing rates and help you build and maintain muscle strength. Do exercises exactly as told by your health care provider. You may do these exercises:  Walking or hiking.  Swimming.  Water aerobics.  Riding a stationary bike.  Strength training.  Modified yoga or Pilates. Tell your instructor that you are pregnant. Avoid overstretching, and avoid lying on your back for long periods of time.  Running or jogging. Only choose this type of exercise if you: ? Ran or jogged regularly before your pregnancy. ? Can run or jog and still talk in complete sentences. What exercises should I avoid? Depending on your level of fitness and whether you exercised regularly before your pregnancy, you may be told to limit high-intensity exercise. You can tell that  you are exercising at a high intensity if you are breathing much harder and faster and cannot hold a conversation while exercising. You must avoid:  Contact sports.  Activities that put you at risk for falling on or being hit in the belly, such as downhill skiing, water skiing, surfing, rock climbing, cycling, gymnastics, and horseback riding.  Scuba diving.  Skydiving.  Yoga or Pilates in a room that is heated to high temperatures.  Jogging or running, unless you ran or jogged regularly before your pregnancy. While jogging or running, you should always be able to talk in full sentences. Do not run or jog so fast that you are unable to have a conversation.  Do not exercise at more than 6,000 feet above sea level (high  elevation) if you are not used to exercising at high elevation. How do I exercise in a safe way?   Avoid overheating. Do not exercise in very high temperatures.  Wear loose-fitting, breathable clothes.  Avoid dehydration. Drink enough water before, during, and after exercise to keep your urine pale yellow.  Avoid overstretching. Because of hormone changes during pregnancy, it is easy to overstretch muscles, tendons, and ligaments during pregnancy.  Start slowly and ask your health care provider to recommend the types of exercise that are safe for you.  Do not exercise to lose weight. Follow these instructions at home:  Exercise on most days or all days of the week. Try to exercise for 30 minutes a day, 5 days a week, unless your health care provider tells you not to.  If you actively exercised before your pregnancy and you are healthy, your health care provider may tell you to continue to do moderate to high-intensity exercise.  If you are just starting to exercise or did not exercise much before your pregnancy, your health care provider may tell you to do low to moderate-intensity exercise. Questions to ask your health care provider  Is exercise safe for me?  What are signs that I should stop exercising?  Does my health condition mean that I should not exercise during pregnancy?  When should I avoid exercising during pregnancy? Stop exercising and contact a health care provider if: You have any unusual symptoms, such as:  Mild contractions of the uterus or cramps in the abdomen.  Dizziness that does not go away when you rest. Stop exercising and get help right away if: You have any unusual symptoms, such as:  Sudden, severe pain in your low back or your belly.  Mild contractions of the uterus or cramps in the abdomen that do not improve with rest and drinking fluids.  Chest pain.  Bleeding or fluid leaking from your vagina.  Shortness of breath. These symptoms may  represent a serious problem that is an emergency. Do not wait to see if the symptoms will go away. Get medical help right away. Call your local emergency services (911 in the [place] Do not drive yourself to the hospital. Summary  Most women should exercise regularly throughout pregnancy. In rare cases, women with certain medical conditions or complications may be asked to limit or avoid exercise during pregnancy.  Do not exercise to lose weight during pregnancy.  Your health care provider will tell you what level of physical activity is right for you.  Stop exercising and contact a health care provider if you have mild contractions of the uterus or cramps in the abdomen. Get help right away if these contractions or cramps do not improve with rest  and drinking fluids.  Stop exercising and get help right away if you have sudden, severe pain in your low back or belly, chest pain, shortness of breath, or bleeding or leaking of fluid from your vagina. This information is not intended to replace advice given to you by your health care provider. Make sure you discuss any questions you have with your health care provider. Document Revised: 03/31/2019 Document Reviewed: 01/12/2019 Elsevier Patient Education  [place].   Common Medications Safe in Pregnancy  Acne:      Constipation:  Benzoyl Peroxide     Colace  Clindamycin      Dulcolax Suppository  Topica Erythromycin     Fibercon  Salicylic Acid      Metamucil         Miralax AVOID:        Senakot   Accutane    Cough:  Retin-A       Cough Drops  Tetracycline      Phenergan w/ Codeine if Rx  Minocycline      Robitussin (Plain & DM)  Antibiotics:     Crabs/Lice:  Ceclor       RID  Cephalosporins    AVOID:  E-Mycins      Kwell  Keflex  Macrobid/Macrodantin   Diarrhea:  Penicillin      Kao-Pectate  Zithromax      Imodium AD         PUSH FLUIDS AVOID:       Cipro     Fever:  Tetracycline      Tylenol (Regular or  Extra  Minocycline       Strength)  Levaquin      Extra Strength-Do not          Exceed 8 tabs/24 hrs Caffeine:        <263m/day (equiv. To 1 cup of coffee or  approx. 3 12 oz sodas)         Gas: Cold/Hayfever:       Gas-X  Benadryl      Mylicon  Claritin       Phazyme  **Claritin-D        Chlor-Trimeton    Headaches:  Dimetapp      ASA-Free Excedrin  Drixoral-Non-Drowsy     Cold Compress  Mucinex (Guaifenasin)     Tylenol (Regular or Extra  Sudafed/Sudafed-12 Hour     Strength)  **Sudafed PE Pseudoephedrine   Tylenol Cold & Sinus     Vicks Vapor Rub  Zyrtec  **AVOID if Problems With Blood Pressure         Heartburn: Avoid lying down for at least 1 hour after meals  Aciphex      Maalox     Rash:  Milk of Magnesia     Benadryl    Mylanta       1% Hydrocortisone Cream  Pepcid  Pepcid Complete   Sleep Aids:  Prevacid      Ambien   Prilosec       Benadryl  Rolaids       Chamomile Tea  Tums (Limit 4/day)     Unisom         Tylenol PM         Warm milk-add vanilla or  Hemorrhoids:       Sugar for taste  Anusol/Anusol H.C.  (RX: Analapram 2.5%)  Sugar Substitutes:  Hydrocortisone OTC     Ok in moderation  Preparation H  Tucks        Vaseline lotion applied to tissue with wiping    Herpes:     Throat:  Acyclovir      Oragel  Famvir  Valtrex     Vaccines:         Flu Shot Leg Cramps:       *Gardasil  Benadryl      Hepatitis A         Hepatitis B Nasal Spray:       Pneumovax  Saline Nasal Spray     Polio Booster         Tetanus Nausea:       Tuberculosis test or PPD  Vitamin B6 25 mg TID   AVOID:    Dramamine      *Gardasil  Emetrol       Live Poliovirus  Ginger Root 250 mg QID    MMR (measles, mumps &  High Complex Carbs @ Bedtime    rebella)  Sea Bands-Accupressure    Varicella (Chickenpox)  Unisom 1/2 tab TID     *No known complications           If received before Pain:         Known pregnancy;   Darvocet       Resume series  after  Lortab        Delivery  Percocet    Yeast:   Tramadol      Femstat  Tylenol 3      Gyne-lotrimin  Ultram       Monistat  Vicodin           MISC:         All Sunscreens           Hair Coloring/highlights          Insect Repellant's          (Including DEET)         Mystic Tans   First Trimester of Pregnancy  The first trimester of pregnancy is from week 1 until the end of week 13 (months 1 through 3). During this time, your baby will begin to develop inside you. At 6-8 weeks, the eyes and face are formed, and the heartbeat can be seen on ultrasound. At the end of 12 weeks, all the baby's organs are formed. Prenatal care is all the medical care you receive before the birth of your baby. Make sure you get good prenatal care and follow all of your doctor's instructions. Follow these instructions at home: Medicines  Take over-the-counter and prescription medicines only as told by your doctor. Some medicines are safe and some medicines are not safe during pregnancy.  Take a prenatal vitamin that contains at least 600 micrograms (mcg) of folic acid.  If you have trouble pooping (constipation), take medicine that will make your stool soft (stool softener) if your doctor approves. Eating and drinking   Eat regular, healthy meals.  Your doctor will tell you the amount of weight gain that is right for you.  Avoid raw meat and uncooked cheese.  If you feel sick to your stomach (nauseous) or throw up (vomit): ? Eat 4 or 5 small meals a day instead of 3 large meals. ? Try eating a few soda crackers. ? Drink liquids between meals instead of during meals.  To prevent constipation: ? Eat foods that are high in fiber, like fresh fruits and vegetables, whole grains, and beans. ? Drink enough fluids to keep  your pee (urine) clear or pale yellow. Activity  Exercise only as told by your doctor. Stop exercising if you have cramps or pain in your lower belly (abdomen) or low back.  Do  not exercise if it is too hot, too humid, or if you are in a place of great height (high altitude).  Try to avoid standing for long periods of time. Move your legs often if you must stand in one place for a long time.  Avoid heavy lifting.  Wear low-heeled shoes. Sit and stand up straight.  You can have sex unless your doctor tells you not to. Relieving pain and discomfort  Wear a good support bra if your breasts are sore.  Take warm water baths (sitz baths) to soothe pain or discomfort caused by hemorrhoids. Use hemorrhoid cream if your doctor says it is okay.  Rest with your legs raised if you have leg cramps or low back pain.  If you have puffy, bulging veins (varicose veins) in your legs: ? Wear support hose or compression stockings as told by your doctor. ? Raise (elevate) your feet for 15 minutes, 3-4 times a day. ? Limit salt in your food. Prenatal care  Schedule your prenatal visits by the twelfth week of pregnancy.  Write down your questions. Take them to your prenatal visits.  Keep all your prenatal visits as told by your doctor. This is important. Safety  Wear your seat belt at all times when driving.  Make a list of emergency phone numbers. The list should include numbers for family, friends, the hospital, and police and fire departments. General instructions  Ask your doctor for a referral to a local prenatal class. Begin classes no later than at the start of month 6 of your pregnancy.  Ask for help if you need counseling or if you need help with nutrition. Your doctor can give you advice or tell you where to go for help.  Do not use hot tubs, steam rooms, or saunas.  Do not douche or use tampons or scented sanitary pads.  Do not cross your legs for long periods of time.  Avoid all herbs and alcohol. Avoid drugs that are not approved by your doctor.  Do not use any tobacco products, including cigarettes, chewing tobacco, and electronic cigarettes. If you  need help quitting, ask your doctor. You may get counseling or other support to help you quit.  Avoid cat litter boxes and soil used by cats. These carry germs that can cause birth defects in the baby and can cause a loss of your baby (miscarriage) or stillbirth.  Visit your dentist. At home, brush your teeth with a soft toothbrush. Be gentle when you floss. Contact a doctor if:  You are dizzy.  You have mild cramps or pressure in your lower belly.  You have a nagging pain in your belly area.  You continue to feel sick to your stomach, you throw up, or you have watery poop (diarrhea).  You have a bad smelling fluid coming from your vagina.  You have pain when you pee (urinate).  You have increased puffiness (swelling) in your face, hands, legs, or ankles. Get help right away if:  You have a fever.  You are leaking fluid from your vagina.  You have spotting or bleeding from your vagina.  You have very bad belly cramping or pain.  You gain or lose weight rapidly.  You throw up blood. It may look like coffee grounds.  You are around  people who have [place] measles, fifth disease, or chickenpox.  You have a very bad headache.  You have shortness of breath.  You have any kind of trauma, such as from a fall or a car accident. Summary  The first trimester of pregnancy is from week 1 until the end of week 13 (months 1 through 3).  To take care of yourself and your unborn baby, you will need to eat healthy meals, take medicines only if your doctor tells you to do so, and do activities that are safe for you and your baby.  Keep all follow-up visits as told by your doctor. This is important as your doctor will have to ensure that your baby is healthy and growing well. This information is not intended to replace advice given to you by your health care provider. Make sure you discuss any questions you have with your health care provider. Document Revised: 03/31/2019 Document  Reviewed: 12/16/2016 Elsevier Patient Education  2020 [employer].

## 2020-11-02 NOTE — Progress Notes (Signed)
Pt presents for NOB nurse interview visit. Pregnancy confirmation done 10/22/20 by Serafina Royals, CNM . G3 . P2002   . Pregnancy education material explained and given. 0 cats in the home. NOB labs ordered. HIV labs and Drug screen were explained optional and she did not decline. Drug screen ordered/declined. PNV encouraged. Genetic screening options discussed . Genetic testing: to be ordered at NOB physical.  Pt. To follow up with provider in 3 weeks for NOB physical. Financial policy reviewed. FMLA paperwork policy reviewed and signed. All questions answered. Pt c/o nausea- rx for Diclegis sent to pt's preferred pharmacy. Encouraged her to eat frequent small meals and have saltines/pretzels.

## 2020-11-07 ENCOUNTER — Other Ambulatory Visit: Payer: Self-pay

## 2020-11-07 ENCOUNTER — Ambulatory Visit (INDEPENDENT_AMBULATORY_CARE_PROVIDER_SITE_OTHER): Payer: Medicaid Other

## 2020-11-07 ENCOUNTER — Telehealth: Payer: Self-pay

## 2020-11-07 ENCOUNTER — Ambulatory Visit (INDEPENDENT_AMBULATORY_CARE_PROVIDER_SITE_OTHER): Payer: Medicaid Other | Admitting: Certified Nurse Midwife

## 2020-11-07 VITALS — BP 89/57 | HR 80 | Ht 59.0 in | Wt 128.1 lb

## 2020-11-07 DIAGNOSIS — Z3A08 8 weeks gestation of pregnancy: Secondary | ICD-10-CM

## 2020-11-07 DIAGNOSIS — Z98891 History of uterine scar from previous surgery: Secondary | ICD-10-CM

## 2020-11-07 DIAGNOSIS — Z3481 Encounter for supervision of other normal pregnancy, first trimester: Secondary | ICD-10-CM | POA: Diagnosis not present

## 2020-11-07 DIAGNOSIS — N926 Irregular menstruation, unspecified: Secondary | ICD-10-CM

## 2020-11-07 DIAGNOSIS — Z3201 Encounter for pregnancy test, result positive: Secondary | ICD-10-CM

## 2020-11-07 DIAGNOSIS — Z87828 Personal history of other (healed) physical injury and trauma: Secondary | ICD-10-CM | POA: Diagnosis not present

## 2020-11-07 LAB — OB RESULTS CONSOLE VARICELLA ZOSTER ANTIBODY, IGG: Varicella: IMMUNE

## 2020-11-07 MED ORDER — DOXYLAMINE-PYRIDOXINE 10-10 MG PO TBEC: 10.0000 mg | DELAYED_RELEASE_TABLET | Freq: Every day | ORAL | 0 refills | Status: DC

## 2020-11-07 NOTE — Telephone Encounter (Signed)
mychart message sent

## 2020-11-07 NOTE — Patient Instructions (Signed)
Morning Sickness  Morning sickness is when you feel sick to your stomach (nauseous) during pregnancy. You may feel sick to your stomach and throw up (vomit). You may feel sick in the morning, but you can feel this way at any time of day. Some women feel very sick to their stomach and cannot stop throwing up (hyperemesis gravidarum). Follow these instructions at home: Medicines  Take over-the-counter and prescription medicines only as told by your doctor. Do not take any medicines until you talk with your doctor about them first.  Taking multivitamins before getting pregnant can stop or lessen the harshness of morning sickness. Eating and drinking  Eat dry toast or crackers before getting out of bed.  Eat 5 or 6 small meals a day.  Eat dry and bland foods like rice and baked potatoes.  Do not eat greasy, fatty, or spicy foods.  Have someone cook for you if the smell of food causes you to feel sick or throw up.  If you feel sick to your stomach after taking prenatal vitamins, take them at night or with a snack.  Eat protein when you need a snack. Nuts, yogurt, and cheese are good choices.  Drink fluids throughout the day.  Try ginger ale made with real ginger, ginger tea made from fresh grated ginger, or ginger candies. General instructions  Do not use any products that have nicotine or tobacco in them, such as cigarettes and e-cigarettes. If you need help quitting, ask your doctor.  Use an air purifier to keep the air in your house free of smells.  Get lots of fresh air.  Try to avoid smells that make you feel sick.  Try: ? Wearing a bracelet that is used for seasickness (acupressure wristband). ? Going to a doctor who puts thin needles into certain body points (acupuncture) to improve how you feel. Contact a doctor if:  You need medicine to feel better.  You feel dizzy or light-headed.  You are losing weight. Get help right away if:  You feel very sick to your  stomach and cannot stop throwing up.  You pass out (faint).  You have very bad pain in your belly. Summary  Morning sickness is when you feel sick to your stomach (nauseous) during pregnancy.  You may feel sick in the morning, but you can feel this way at any time of day.  Making some changes to what you eat may help your symptoms go away. This information is not intended to replace advice given to you by your health care provider. Make sure you discuss any questions you have with your health care provider. Document Revised: 11/20/2017 Document Reviewed: 01/08/2017 Elsevier Patient Education  2020 [employer]. How a Baby Grows During Pregnancy  Pregnancy begins when a female's sperm enters a female's egg (fertilization). Fertilization usually happens in one of the tubes (fallopian tubes) that connect the ovaries to the womb (uterus). The fertilized egg moves down the fallopian tube to the uterus. Once it reaches the uterus, it implants into the lining of the uterus and begins to grow. For the first 10 weeks, the fertilized egg is called an embryo. After 10 weeks, it is called a fetus. As the fetus continues to grow, it receives oxygen and nutrients through tissue (placenta) that grows to support the developing baby. The placenta is the life support system for the baby. It provides oxygen and nutrition and removes waste. Learning as much as you can about your pregnancy and how your baby  is developing can help you enjoy the experience. It can also make you aware of when there might be a problem and when to ask questions. How long does a typical pregnancy last? A pregnancy usually lasts 280 days, or about 40 weeks. Pregnancy is divided into three periods of growth, also called trimesters:  First trimester: 0-12 weeks.  Second trimester: 13-27 weeks.  Third trimester: 28-40 weeks. The day when your baby is ready to be born (full term) is your estimated date of delivery. How does my baby  develop month by month? First month  The fertilized egg attaches to the inside of the uterus.  Some cells will form the placenta. Others will form the fetus.  The arms, legs, brain, spinal cord, lungs, and heart begin to develop.  At the end of the first month, the heart begins to beat. Second month  The bones, inner ear, eyelids, hands, and feet form.  The genitals develop.  By the end of 8 weeks, all major organs are developing. Third month  All of the internal organs are forming.  Teeth develop below the gums.  Bones and muscles begin to grow. The spine can flex.  The skin is transparent.  Fingernails and toenails begin to form.  Arms and legs continue to grow longer, and hands and feet develop.  The fetus is about 3 inches (7.6 cm) long. Fourth month  The placenta is completely formed.  The external sex organs, neck, outer ear, eyebrows, eyelids, and fingernails are formed.  The fetus can hear, swallow, and move its arms and legs.  The kidneys begin to produce urine.  The skin is covered with a white, waxy coating (vernix) and very fine hair (lanugo). Fifth month  The fetus moves around more and can be felt for the first time (quickening).  The fetus starts to sleep and wake up and may begin to suck its finger.  The nails grow to the end of the fingers.  The organ in the digestive system that makes bile (gallbladder) functions and helps to digest nutrients.  If your baby is a girl, eggs are present in her ovaries. If your baby is a boy, testicles start to move down into his scrotum. Sixth month  The lungs are formed.  The eyes open. The brain continues to develop.  Your baby has fingerprints and toe prints. Your baby's hair grows thicker.  At the end of the second trimester, the fetus is about 9 inches (22.9 cm) long. Seventh month  The fetus kicks and stretches.  The eyes are developed enough to sense changes in light.  The hands can make a  grasping motion.  The fetus responds to sound. Eighth month  All organs and body systems are fully developed and functioning.  Bones harden, and taste buds develop. The fetus may hiccup.  Certain areas of the brain are still developing. The skull remains soft. Ninth month  The fetus gains about  lb (0.23 kg) each week.  The lungs are fully developed.  Patterns of sleep develop.  The fetus's head typically moves into a head-down position (vertex) in the uterus to prepare for birth.  The fetus weighs 6-9 lb (2.72-4.08 kg) and is 19-20 inches (48.26-50.8 cm) long. What can I do to have a healthy pregnancy and help my baby develop? General instructions  Take prenatal vitamins as directed by your health care provider. These include vitamins such as folic acid, iron, calcium, and vitamin D. They are important for healthy development.  Take medicines only as directed by your health care provider. Read labels and ask a pharmacist or your health care provider whether over-the-counter medicines, supplements, and prescription drugs are safe to take during pregnancy.  Keep all follow-up visits as directed by your health care provider. This is important. Follow-up visits include prenatal care and screening tests. How do I know if my baby is developing well? At each prenatal visit, your health care provider will do several different tests to check on your health and keep track of your baby's development. These include:  Fundal height and position. ? Your health care provider will measure your growing belly from your pubic bone to the top of the uterus using a tape measure. ? Your health care provider will also feel your belly to determine your baby's position.  Heartbeat. ? An ultrasound in the first trimester can confirm pregnancy and show a heartbeat, depending on how far along you are. ? Your health care provider will check your baby's heart rate at every prenatal visit.  Second  trimester ultrasound. ? This ultrasound checks your baby's development. It also may show your baby's gender. What should I do if I have concerns about my baby's development? Always talk with your health care provider about any concerns that you may have about your pregnancy and your baby. Summary  A pregnancy usually lasts 280 days, or about 40 weeks. Pregnancy is divided into three periods of growth, also called trimesters.  Your health care provider will monitor your baby's growth and development throughout your pregnancy.  Follow your health care provider's recommendations about taking prenatal vitamins and medicines during your pregnancy.  Talk with your health care provider if you have any concerns about your pregnancy or your developing baby. This information is not intended to replace advice given to you by your health care provider. Make sure you discuss any questions you have with your health care provider. Document Revised: 03/31/2019 Document Reviewed: 10/21/2017 Elsevier Patient Education  2020 [place] of Pregnancy  The first trimester of pregnancy is from week 1 until the end of week 13 (months 1 through 3). During this time, your baby will begin to develop inside you. At 6-8 weeks, the eyes and face are formed, and the heartbeat can be seen on ultrasound. At the end of 12 weeks, all the baby's organs are formed. Prenatal care is all the medical care you receive before the birth of your baby. Make sure you get good prenatal care and follow all of your doctor's instructions. Follow these instructions at home: Medicines  Take over-the-counter and prescription medicines only as told by your doctor. Some medicines are safe and some medicines are not safe during pregnancy.  Take a prenatal vitamin that contains at least 600 micrograms (mcg) of folic acid.  If you have trouble pooping (constipation), take medicine that will make your stool soft (stool softener)  if your doctor approves. Eating and drinking   Eat regular, healthy meals.  Your doctor will tell you the amount of weight gain that is right for you.  Avoid raw meat and uncooked cheese.  If you feel sick to your stomach (nauseous) or throw up (vomit): ? Eat 4 or 5 small meals a day instead of 3 large meals. ? Try eating a few soda crackers. ? Drink liquids between meals instead of during meals.  To prevent constipation: ? Eat foods that are high in fiber, like fresh fruits and vegetables, whole grains, and beans. ? Drink  enough fluids to keep your pee (urine) clear or pale yellow. Activity  Exercise only as told by your doctor. Stop exercising if you have cramps or pain in your lower belly (abdomen) or low back.  Do not exercise if it is too hot, too humid, or if you are in a place of great height (high altitude).  Try to avoid standing for long periods of time. Move your legs often if you must stand in one place for a long time.  Avoid heavy lifting.  Wear low-heeled shoes. Sit and stand up straight.  You can have sex unless your doctor tells you not to. Relieving pain and discomfort  Wear a good support bra if your breasts are sore.  Take warm water baths (sitz baths) to soothe pain or discomfort caused by hemorrhoids. Use hemorrhoid cream if your doctor says it is okay.  Rest with your legs raised if you have leg cramps or low back pain.  If you have puffy, bulging veins (varicose veins) in your legs: ? Wear support hose or compression stockings as told by your doctor. ? Raise (elevate) your feet for 15 minutes, 3-4 times a day. ? Limit salt in your food. Prenatal care  Schedule your prenatal visits by the twelfth week of pregnancy.  Write down your questions. Take them to your prenatal visits.  Keep all your prenatal visits as told by your doctor. This is important. Safety  Wear your seat belt at all times when driving.  Make a list of emergency phone  numbers. The list should include numbers for family, friends, the hospital, and police and fire departments. General instructions  Ask your doctor for a referral to a local prenatal class. Begin classes no later than at the start of month 6 of your pregnancy.  Ask for help if you need counseling or if you need help with nutrition. Your doctor can give you advice or tell you where to go for help.  Do not use hot tubs, steam rooms, or saunas.  Do not douche or use tampons or scented sanitary pads.  Do not cross your legs for long periods of time.  Avoid all herbs and alcohol. Avoid drugs that are not approved by your doctor.  Do not use any tobacco products, including cigarettes, chewing tobacco, and electronic cigarettes. If you need help quitting, ask your doctor. You may get counseling or other support to help you quit.  Avoid cat litter boxes and soil used by cats. These carry germs that can cause birth defects in the baby and can cause a loss of your baby (miscarriage) or stillbirth.  Visit your dentist. At home, brush your teeth with a soft toothbrush. Be gentle when you floss. Contact a doctor if:  You are dizzy.  You have mild cramps or pressure in your lower belly.  You have a nagging pain in your belly area.  You continue to feel sick to your stomach, you throw up, or you have watery poop (diarrhea).  You have a bad smelling fluid coming from your vagina.  You have pain when you pee (urinate).  You have increased puffiness (swelling) in your face, hands, legs, or ankles. Get help right away if:  You have a fever.  You are leaking fluid from your vagina.  You have spotting or bleeding from your vagina.  You have very bad belly cramping or pain.  You gain or lose weight rapidly.  You throw up blood. It may look like coffee grounds.  You are around people who have [place] measles, fifth disease, or chickenpox.  You have a very bad headache.  You have shortness  of breath.  You have any kind of trauma, such as from a fall or a car accident. Summary  The first trimester of pregnancy is from week 1 until the end of week 13 (months 1 through 3).  To take care of yourself and your unborn baby, you will need to eat healthy meals, take medicines only if your doctor tells you to do so, and do activities that are safe for you and your baby.  Keep all follow-up visits as told by your doctor. This is important as your doctor will have to ensure that your baby is healthy and growing well. This information is not intended to replace advice given to you by your health care provider. Make sure you discuss any questions you have with your health care provider. Document Revised: 03/31/2019 Document Reviewed: 12/16/2016 Elsevier Patient Education  [place]. Common Medications Safe in Pregnancy  Acne:      Constipation:  Benzoyl Peroxide     Colace  Clindamycin      Dulcolax Suppository  Topica Erythromycin     Fibercon  Salicylic Acid      Metamucil         Miralax AVOID:        Senakot   Accutane    Cough:  Retin-A       Cough Drops  Tetracycline      Phenergan w/ Codeine if Rx  Minocycline      Robitussin (Plain & DM)  Antibiotics:     Crabs/Lice:  Ceclor       RID  Cephalosporins    AVOID:  E-Mycins      Kwell  Keflex  Macrobid/Macrodantin   Diarrhea:  Penicillin      Kao-Pectate  Zithromax      Imodium AD         PUSH FLUIDS AVOID:       Cipro     Fever:  Tetracycline      Tylenol (Regular or Extra  Minocycline       Strength)  Levaquin      Extra Strength-Do not          Exceed 8 tabs/24 hrs Caffeine:        <235m/day (equiv. To 1 cup of coffee or  approx. 3 12 oz sodas)         Gas: Cold/Hayfever:       Gas-X  Benadryl      Mylicon  Claritin       Phazyme  **Claritin-D        Chlor-Trimeton    Headaches:  Dimetapp      ASA-Free Excedrin  Drixoral-Non-Drowsy     Cold Compress  Mucinex (Guaifenasin)     Tylenol (Regular or  Extra  Sudafed/Sudafed-12 Hour     Strength)  **Sudafed PE Pseudoephedrine   Tylenol Cold & Sinus     Vicks Vapor Rub  Zyrtec  **AVOID if Problems With Blood Pressure         Heartburn: Avoid lying down for at least 1 hour after meals  Aciphex      Maalox     Rash:  Milk of Magnesia     Benadryl    Mylanta       1% Hydrocortisone Cream  Pepcid  Pepcid Complete   Sleep Aids:  Prevacid  Ambien   Prilosec       Benadryl  Rolaids       Chamomile Tea  Tums (Limit 4/day)     Unisom         Tylenol PM         Warm milk-add vanilla or  Hemorrhoids:       Sugar for taste  Anusol/Anusol H.C.  (RX: Analapram 2.5%)  Sugar Substitutes:  Hydrocortisone OTC     Ok in moderation  Preparation H      Tucks        Vaseline lotion applied to tissue with wiping    Herpes:     Throat:  Acyclovir      Oragel  Famvir  Valtrex     Vaccines:         Flu Shot Leg Cramps:       *Gardasil  Benadryl      Hepatitis A         Hepatitis B Nasal Spray:       Pneumovax  Saline Nasal Spray     Polio Booster         Tetanus Nausea:       Tuberculosis test or PPD  Vitamin B6 25 mg TID   AVOID:    Dramamine      *Gardasil  Emetrol       Live Poliovirus  Ginger Root 250 mg QID    MMR (measles, mumps &  High Complex Carbs @ Bedtime    rebella)  Sea Bands-Accupressure    Varicella (Chickenpox)  Unisom 1/2 tab TID     *No known complications           If received before Pain:         Known pregnancy;   Darvocet       Resume series after  Lortab        Delivery  Percocet    Yeast:   Tramadol      Femstat  Tylenol 3      Gyne-lotrimin  Ultram       Monistat  Vicodin           MISC:         All Sunscreens           Hair Coloring/highlights          Insect Repellant's          (Including DEET)         Mystic Tans

## 2020-11-08 LAB — URINALYSIS, ROUTINE W REFLEX MICROSCOPIC
Bilirubin, UA: NEGATIVE
Glucose, UA: NEGATIVE
Nitrite, UA: NEGATIVE
RBC, UA: NEGATIVE
Specific Gravity, UA: 1.029 (ref 1.005–1.030)
Urobilinogen, Ur: 1 mg/dL (ref 0.2–1.0)
pH, UA: 7 (ref 5.0–7.5)

## 2020-11-08 LAB — MICROSCOPIC EXAMINATION: Epithelial Cells (non renal): >10 /hpf — AB (ref 0–10)

## 2020-11-08 LAB — RUBELLA SCREEN: Rubella Antibodies, IGG: 2.19 index (ref >0.99)

## 2020-11-08 LAB — ANTIBODY SCREEN: Antibody Screen: NEGATIVE

## 2020-11-08 LAB — VARICELLA ZOSTER ANTIBODY, IGG: Varicella zoster IgG: 478 index (ref >165)

## 2020-11-08 LAB — HEPATITIS B SURFACE ANTIGEN: Hepatitis B Surface Ag: NEGATIVE

## 2020-11-08 LAB — HIV ANTIBODY (ROUTINE TESTING W REFLEX): HIV Screen 4th Generation wRfx: NONREACTIVE

## 2020-11-08 LAB — RPR: RPR Ser Ql: NONREACTIVE

## 2020-11-08 NOTE — Progress Notes (Signed)
I have reviewed the record and concur with patient management and plan of care.    Serafina Royals, CNM [facility], [facility] 11/08/20 12:50 PM

## 2020-11-09 LAB — URINE CULTURE, OB REFLEX

## 2020-11-09 LAB — CULTURE, OB URINE

## 2020-11-10 LAB — MONITOR DRUG PROFILE 14(MW)
Amphetamine Scrn, Ur: NEGATIVE ng/mL
BARBITURATE SCREEN URINE: NEGATIVE ng/mL
BENZODIAZEPINE SCREEN, URINE: NEGATIVE ng/mL
Buprenorphine, Urine: NEGATIVE ng/mL
CANNABINOIDS UR QL SCN: NEGATIVE ng/mL
Cocaine (Metab) Scrn, Ur: NEGATIVE ng/mL
Creatinine(Crt), U: 192.8 mg/dL (ref 20.0–300.0)
Fentanyl, Urine: NEGATIVE pg/mL
Meperidine Screen, Urine: NEGATIVE ng/mL
Methadone Screen, Urine: NEGATIVE ng/mL
OXYCODONE+OXYMORPHONE UR QL SCN: NEGATIVE ng/mL
Opiate Scrn, Ur: NEGATIVE ng/mL
Ph of Urine: 7.3 (ref 4.5–8.9)
Phencyclidine Qn, Ur: NEGATIVE ng/mL
Propoxyphene Scrn, Ur: NEGATIVE ng/mL
SPECIFIC GRAVITY: 1.024
Tramadol Screen, Urine: NEGATIVE ng/mL

## 2020-11-10 LAB — NICOTINE SCREEN, URINE: Cotinine Ql Scrn, Ur: NEGATIVE ng/mL

## 2020-11-11 LAB — GC/CHLAMYDIA PROBE AMP
Chlamydia trachomatis, NAA: NEGATIVE
Neisseria Gonorrhoeae by PCR: NEGATIVE

## 2020-12-07 ENCOUNTER — Ambulatory Visit (INDEPENDENT_AMBULATORY_CARE_PROVIDER_SITE_OTHER): Payer: Medicaid Other | Admitting: Certified Nurse Midwife

## 2020-12-07 ENCOUNTER — Encounter: Payer: Self-pay | Admitting: Certified Nurse Midwife

## 2020-12-07 ENCOUNTER — Other Ambulatory Visit: Payer: Self-pay

## 2020-12-07 ENCOUNTER — Encounter: Payer: Medicaid Other | Admitting: Certified Nurse Midwife

## 2020-12-07 VITALS — BP 100/62 | Wt 125.2 lb

## 2020-12-07 DIAGNOSIS — Z3A12 12 weeks gestation of pregnancy: Secondary | ICD-10-CM

## 2020-12-07 DIAGNOSIS — Z130 Encounter for screening for diseases of the blood and blood-forming organs and certain disorders involving the immune mechanism: Secondary | ICD-10-CM

## 2020-12-07 DIAGNOSIS — R319 Hematuria, unspecified: Secondary | ICD-10-CM

## 2020-12-07 DIAGNOSIS — Z98891 History of uterine scar from previous surgery: Secondary | ICD-10-CM

## 2020-12-07 DIAGNOSIS — Z1379 Encounter for other screening for genetic and chromosomal anomalies: Secondary | ICD-10-CM

## 2020-12-07 DIAGNOSIS — Z3481 Encounter for supervision of other normal pregnancy, first trimester: Secondary | ICD-10-CM

## 2020-12-07 DIAGNOSIS — Z87828 Personal history of other (healed) physical injury and trauma: Secondary | ICD-10-CM

## 2020-12-07 LAB — POCT URINALYSIS DIPSTICK OB
Bilirubin, UA: NEGATIVE
Glucose, UA: NEGATIVE
Ketones, UA: NEGATIVE
Nitrite, UA: NEGATIVE
Spec Grav, UA: 1.025 (ref 1.010–1.025)
Urobilinogen, UA: 0.2 E.U./dL
pH, UA: 6 (ref 5.0–8.0)

## 2020-12-07 NOTE — Progress Notes (Signed)
I have seen, interviewed, and examined the patient in conjunction with the [facility] and affirm the diagnosis and management plan.   Gunnar Bulla, CNM [facility], [facility] 12/07/20 5:18 PM

## 2020-12-07 NOTE — Progress Notes (Signed)
Pt present for routine prenatal visit. Hematuria- urine c&s ordered. 

## 2020-12-07 NOTE — Patient Instructions (Addendum)
WHAT OB PATIENTS CAN EXPECT   Confirmation of pregnancy and ultrasound ordered if medically indicated-[redacted] weeks gestation  New OB (NOB) intake with nurse and New OB (NOB) labs- [redacted] weeks gestation  New OB (NOB) physical examination with provider- 11/[redacted] weeks gestation  Flu vaccine-[redacted] weeks gestation  Anatomy scan-[redacted] weeks gestation  Glucose tolerance test, blood work to test for anemia, T-dap vaccine-[redacted] weeks gestation  Vaginal swabs/cultures-STD/Group B strep-[redacted] weeks gestation  Appointments every 4 weeks until 28 weeks  Every 2 weeks from 28 weeks until 36 weeks  Weekly visits from 36 weeks until delivery    Morning Sickness  Morning sickness is when you feel sick to your stomach (nauseous) during pregnancy. You may feel sick to your stomach and throw up (vomit). You may feel sick in the morning, but you can feel this way at any time of day. Some women feel very sick to their stomach and cannot stop throwing up (hyperemesis gravidarum). Follow these instructions at home: Medicines  Take over-the-counter and prescription medicines only as told by your doctor. Do not take any medicines until you talk with your doctor about them first.  Taking multivitamins before getting pregnant can stop or lessen the harshness of morning sickness. Eating and drinking  Eat dry toast or crackers before getting out of bed.  Eat 5 or 6 small meals a day.  Eat dry and bland foods like rice and baked potatoes.  Do not eat greasy, fatty, or spicy foods.  Have someone cook for you if the smell of food causes you to feel sick or throw up.  If you feel sick to your stomach after taking prenatal vitamins, take them at night or with a snack.  Eat protein when you need a snack. Nuts, yogurt, and cheese are good choices.  Drink fluids throughout the day.  Try ginger ale made with real ginger, ginger tea made from fresh grated ginger, or ginger candies. General instructions  Do not use any  products that have nicotine or tobacco in them, such as cigarettes and e-cigarettes. If you need help quitting, ask your doctor.  Use an air purifier to keep the air in your house free of smells.  Get lots of fresh air.  Try to avoid smells that make you feel sick.  Try: ? Wearing a bracelet that is used for seasickness (acupressure wristband). ? Going to a doctor who puts thin needles into certain body points (acupuncture) to improve how you feel. Contact a doctor if:  You need medicine to feel better.  You feel dizzy or light-headed.  You are losing weight. Get help right away if:  You feel very sick to your stomach and cannot stop throwing up.  You pass out (faint).  You have very bad pain in your belly. Summary  Morning sickness is when you feel sick to your stomach (nauseous) during pregnancy.  You may feel sick in the morning, but you can feel this way at any time of day.  Making some changes to what you eat may help your symptoms go away. This information is not intended to replace advice given to you by your health care provider. Make sure you discuss any questions you have with your health care provider. Document Revised: 11/20/2017 Document Reviewed: 01/08/2017 Elsevier Patient Education  2020 [employer].   Breastfeeding During Pregnancy Deciding whether to continue breastfeeding during a pregnancy is an individual choice. Breastfeeding during pregnancy is generally not risky. Your nursing child may naturally stop breastfeeding (wean)  during your pregnancy. If you have problems during pregnancy, you may be advised to stop breastfeeding. Work with your health care provider to help decide if breastfeeding during pregnancy is right for you. What should I consider when deciding whether to breastfeed during pregnancy? When deciding whether to continue breastfeeding while you are pregnant, you may want to consider:  The age of your nursing child and his or her  physical and emotional needs.  Any health concerns related to your pregnancy. It may not be safe to continue breastfeeding if you have certain problems, such as: ? Uterine pain or bleeding. ? A history of preterm labor and delivery. ? Problems gaining weight or losing weight during pregnancy. ? A history of cervical insufficiency. This is a condition in which the cervix begins to thin and soften before your due date.  Whether you have any problems associated with breastfeeding. Some common problems experienced when breastfeeding during pregnancy include: ? Nipple tenderness and breast soreness. ? Nausea. ? Discomfort while breastfeeding due to the growing belly. ? Fatigue. ? Reduced milk supply. This may mean having fewer feedings a day. ? Changes in how your milk tastes. ? Uterine contractions. Follow these instructions at home:   Keep an open mind about how your breastfeeding experience will be. Avoid setting rigid expectations for yourself. Your needs and your nursing child's needs are likely to change as your pregnancy progresses.  Make sure that you are gaining a healthy amount of weight and eating enough calories.  Eat a healthy diet that includes fresh fruits and vegetables, whole grains, lean meat, fish, eggs, beans, nuts, and seeds, and low-fat dairy products.  Drink plenty of fluids so your urine is clear or pale yellow.  Work with your health care provider and your child's health care provider as needed.  Keep track of your nursing child's weight. If your nursing baby is younger than twelve months, the normal decrease of your milk supply that happens during pregnancy could keep your baby from getting all the milk he or she needs.  Keep track of your nursing child's daily wet diapers and bowel movements to make sure he or she is staying hydrated. Your baby should have 6-8 wet diapers and at least 3 stools each day.  Talk to your health care provider or lactation specialist  before starting any new medications or supplements. This is to make sure that they are safe for both pregnancy and breastfeeding. Where to find more information  [employer]: http://www.llli.org Contact a health care provider if:  Your breasts become large and painful (engorged).  Your nursing child is urinating or having bowel movements less often than normal. Summary  Breastfeeding during pregnancy is generally not risky. However, if you have problems during pregnancy, you may be advised to stop breastfeeding.  During pregnancy, your milk supply naturally decreases. Your nursing child may wean himself or herself naturally during your pregnancy.  Keep track of your nursing child's weight, wet diapers, and stools to make sure that he or she is getting enough milk.  Keep an open mind about how your breastfeeding experience will be. Avoid setting rigid expectations for yourself. This information is not intended to replace advice given to you by your health care provider. Make sure you discuss any questions you have with your health care provider. Document Revised: 04/14/2017 Document Reviewed: 12/09/2016 Elsevier Patient Education  2020 Elsevier Inc.  Round Ligament Pain  The round ligament is a cord of muscle and tissue that  helps support the uterus. It can become a source of pain during pregnancy if it becomes stretched or twisted as the baby grows. The pain usually begins in the second trimester (13-28 weeks) of pregnancy, and it can come and go until the baby is delivered. It is not a serious problem, and it does not cause harm to the baby. Round ligament pain is usually a short, sharp, and pinching pain, but it can also be a dull, lingering, and aching pain. The pain is felt in the lower side of the abdomen or in the groin. It usually starts deep in the groin and moves up to the outside of the hip area. The pain may occur when you:  Suddenly change position, such as  quickly going from a sitting to standing position.  Roll over in bed.  Cough or sneeze.  Do physical activity. Follow these instructions at home:   Watch your condition for any changes.  When the pain starts, relax. Then try any of these methods to help with the pain: ? Sitting down. ? Flexing your knees up to your abdomen. ? Lying on your side with one pillow under your abdomen and another pillow between your legs. ? Sitting in a warm bath for 15-20 minutes or until the pain goes away.  Take over-the-counter and prescription medicines only as told by your health care provider.  Move slowly when you sit down or stand up.  Avoid long walks if they cause pain.  Stop or reduce your physical activities if they cause pain.  Keep all follow-up visits as told by your health care provider. This is important. Contact a health care provider if:  Your pain does not go away with treatment.  You feel pain in your back that you did not have before.  Your medicine is not helping. Get help right away if:  You have a fever or chills.  You develop uterine contractions.  You have vaginal bleeding.  You have nausea or vomiting.  You have diarrhea.  You have pain when you urinate. Summary  Round ligament pain is felt in the lower abdomen or groin. It is usually a short, sharp, and pinching pain. It can also be a dull, lingering, and aching pain.  This pain usually begins in the second trimester (13-28 weeks). It occurs because the uterus is stretching with the growing baby, and it is not harmful to the baby.  You may notice the pain when you suddenly change position, when you cough or sneeze, or during physical activity.  Relaxing, flexing your knees to your abdomen, lying on one side, or taking a warm bath may help to get rid of the pain.  Get help from your health care provider if the pain does not go away or if you have vaginal bleeding, nausea, vomiting, diarrhea, or painful  urination. This information is not intended to replace advice given to you by your health care provider. Make sure you discuss any questions you have with your health care provider. Document Revised: 05/26/2018 Document Reviewed: 05/26/2018 Elsevier Patient Education  2020 [employer].  Second Trimester of Pregnancy  The second trimester is from week 14 through week 27 (month 4 through 6). This is often the time in pregnancy that you feel your best. Often times, morning sickness has lessened or quit. You may have more energy, and you may get hungry more often. Your unborn baby is growing rapidly. At the end of the sixth month, he or she is about  9 inches long and weighs about 1 pounds. You will likely feel the baby move between 18 and 20 weeks of pregnancy. Follow these instructions at home: Medicines  Take over-the-counter and prescription medicines only as told by your doctor. Some medicines are safe and some medicines are not safe during pregnancy.  Take a prenatal vitamin that contains at least 600 micrograms (mcg) of folic acid.  If you have trouble pooping (constipation), take medicine that will make your stool soft (stool softener) if your doctor approves. Eating and drinking   Eat regular, healthy meals.  Avoid raw meat and uncooked cheese.  If you get low calcium from the food you eat, talk to your doctor about taking a daily calcium supplement.  Avoid foods that are high in fat and sugars, such as fried and sweet foods.  If you feel sick to your stomach (nauseous) or throw up (vomit): ? Eat 4 or 5 small meals a day instead of 3 large meals. ? Try eating a few soda crackers. ? Drink liquids between meals instead of during meals.  To prevent constipation: ? Eat foods that are high in fiber, like fresh fruits and vegetables, whole grains, and beans. ? Drink enough fluids to keep your pee (urine) clear or pale yellow. Activity  Exercise only as told by your doctor. Stop  exercising if you start to have cramps.  Do not exercise if it is too hot, too humid, or if you are in a place of great height (high altitude).  Avoid heavy lifting.  Wear low-heeled shoes. Sit and stand up straight.  You can continue to have sex unless your doctor tells you not to. Relieving pain and discomfort  Wear a good support bra if your breasts are tender.  Take warm water baths (sitz baths) to soothe pain or discomfort caused by hemorrhoids. Use hemorrhoid cream if your doctor approves.  Rest with your legs raised if you have leg cramps or low back pain.  If you develop puffy, bulging veins (varicose veins) in your legs: ? Wear support hose or compression stockings as told by your doctor. ? Raise (elevate) your feet for 15 minutes, 3-4 times a day. ? Limit salt in your food. Prenatal care  Write down your questions. Take them to your prenatal visits.  Keep all your prenatal visits as told by your doctor. This is important. Safety  Wear your seat belt when driving.  Make a list of emergency phone numbers, including numbers for family, friends, the hospital, and police and fire departments. General instructions  Ask your doctor about the right foods to eat or for help finding a counselor, if you need these services.  Ask your doctor about local prenatal classes. Begin classes before month 6 of your pregnancy.  Do not use hot tubs, steam rooms, or saunas.  Do not douche or use tampons or scented sanitary pads.  Do not cross your legs for long periods of time.  Visit your dentist if you have not done so. Use a soft toothbrush to brush your teeth. Floss gently.  Avoid all smoking, herbs, and alcohol. Avoid drugs that are not approved by your doctor.  Do not use any products that contain nicotine or tobacco, such as cigarettes and e-cigarettes. If you need help quitting, ask your doctor.  Avoid cat litter boxes and soil used by cats. These carry germs that can  cause birth defects in the baby and can cause a loss of your baby (miscarriage) or stillbirth. Contact  a doctor if:  You have mild cramps or pressure in your lower belly.  You have pain when you pee (urinate).  You have bad smelling fluid coming from your vagina.  You continue to feel sick to your stomach (nauseous), throw up (vomit), or have watery poop (diarrhea).  You have a nagging pain in your belly area.  You feel dizzy. Get help right away if:  You have a fever.  You are leaking fluid from your vagina.  You have spotting or bleeding from your vagina.  You have severe belly cramping or pain.  You lose or gain weight rapidly.  You have trouble catching your breath and have chest pain.  You notice sudden or extreme puffiness (swelling) of your face, hands, ankles, feet, or legs.  You have not felt the baby move in over an hour.  You have severe headaches that do not go away when you take medicine.  You have trouble seeing. Summary  The second trimester is from week 14 through week 27 (months 4 through 6). This is often the time in pregnancy that you feel your best.  To take care of yourself and your unborn baby, you will need to eat healthy meals, take medicines only if your doctor tells you to do so, and do activities that are safe for you and your baby.  Call your doctor if you get sick or if you notice anything unusual about your pregnancy. Also, call your doctor if you need help with the right food to eat, or if you want to know what activities are safe for you. This information is not intended to replace advice given to you by your health care provider. Make sure you discuss any questions you have with your health care provider. Document Revised: 04/01/2019 Document Reviewed: 01/13/2017 Elsevier Patient Education  2020 [employer].

## 2020-12-07 NOTE — Progress Notes (Signed)
NEW OB HISTORY AND PHYSICAL  SUBJECTIVE:       Tammy Ward is a 24 y.o. [id number] female, Patient's last menstrual period was 09/05/2020 (exact date)., Estimated Date of Delivery: 06/18/21, [redacted]w[redacted]d, presents today for establishment of Prenatal Care.  She has no unusual complaints, reports nausea without vomiting-continues Diclegis use at night.   Accompanied by her two boys, youngest one in her lap breastfeeding.   Denies any difficulty breathing, respiratory distress, chest pain, abdominal pain, constipation, or swelling in her legs.  Gynecologic History  Patient's last menstrual period was 09/05/2020 (exact date).   Contraception: none   Last Pap: 01/08/2019 Results were: normal  Obstetric History  OB History  Gravida Para Term Preterm AB Living  3 2 2  0 0 2  SAB IAB Ectopic Multiple Live Births  0 0 0 0 2    # Outcome Date GA Lbr Len/2nd Weight Sex Delivery Anes PTL Lv  3 Current           2 Term 08/05/19 [redacted]w[redacted]d  7 lb 7.6 oz (3.39 kg) M CS-LTranv Spinal  LIV  1 Term 11/18/17 [redacted]w[redacted]d  7 lb 5.8 oz (3.34 kg) M CS-LTranv Gen  LIV    Obstetric Comments  Menstrual age: 41    Age 1st Pregnancy:NA    Past Medical History:  Diagnosis Date  . Anemia   . Depression   . History of kidney stones   . Kidney stones    kidney stones, cysts  . Left genital labial abscess     Past Surgical History:  Procedure Laterality Date  . ABSCESS DRAINAGE  02-02-15   Labial   . CESAREAN SECTION N/A 11/18/2017   Procedure: CESAREAN SECTION;  Surgeon: 11/20/2017, MD;  Location: [facility];  Service: Obstetrics;  Laterality: N/A;  . CESAREAN SECTION N/A 08/05/2019   Procedure: REPEAT CESAREAN SECTION;  Surgeon: 08/07/2019, MD;  Location: [facility];  Service: Obstetrics;  Laterality: N/A;  . KIDNEY STONE SURGERY  2014    Current Outpatient Medications on File Prior to Visit  Medication Sig Dispense Refill  . Doxylamine-Pyridoxine 10-10 MG TBEC Take 10 mg by mouth daily.  60 tablet 0  . Prenatal MV & Min w/FA-DHA (PRENATAL ADULT GUMMY/DHA/FA PO) Take by mouth.     No current facility-administered medications on file prior to visit.    No Known Allergies  Social History   Socioeconomic History  . Marital status: Single    Spouse name: Not on file  . Number of children: Not on file  . Years of education: Not on file  . Highest education level: Not on file  Occupational History  . Not on file  Tobacco Use  . Smoking status: Never Smoker  . Smokeless tobacco: Never Used  Vaping Use  . Vaping Use: Never used  Substance and Sexual Activity  . Alcohol use: Not Currently    Alcohol/week: 0.0 standard drinks  . Drug use: Never  . Sexual activity: Yes  Other Topics Concern  . Not on file  Social History Narrative  . Not on file   Social Determinants of Health   Financial Resource Strain: Not on file  Food Insecurity: Not on file  Transportation Needs: Not on file  Physical Activity: Not on file  Stress: Not on file  Social Connections: Not on file  Intimate Partner Violence: Not on file    Family History  Problem Relation Age of Onset  . Asthma Sister   . Thyroid  disease Sister   . Cancer Neg Hx   . Diabetes Neg Hx   . Heart disease Neg Hx   . Breast cancer Neg Hx   . Ovarian cancer Neg Hx   . Colon cancer Neg Hx     The following portions of the patient's history were reviewed and updated as appropriate: allergies, current medications, past OB history, past medical history, past surgical history, past family history, past social history, and problem list.  Review of Systems:  ROS negative except as noted above. Information obtained from patient.   OBJECTIVE:  BP 100/62   Wt 125 lb 3.2 oz (56.8 kg)   LMP 09/05/2020 (Exact Date)   BMI 25.29 kg/m   Initial Physical Exam (New OB)  GENERAL APPEARANCE: alert, well appearing, in no apparent distress, oriented to person, place and time  HEAD: normocephalic,  atraumatic  THYROID: no thyromegaly or masses present  BREASTS: lactating  LUNGS: clear to auscultation, no wheezes, rales or rhonchi, symmetric air entry  HEART: regular rate and rhythm, no murmurs  ABDOMEN: soft, nontender, nondistended, no abnormal masses, no epigastric pain, fundus soft, nontender 12 weeks size and FHT present  EXTREMITIES: no redness or tenderness in the calves or thighs, no edemano adenopathy palpable  SKIN: normal coloration and turgor, no rashes, c/s scar noted  NEUROLOGIC: alert, oriented, normal speech, no focal findings or movement disorder noted  PELVIC EXAM: Declined pelvic exam  ASSESSMENT: Normal pregnancy [redacted] weeks gestation Screening for Iron deficiency anemia Hematuria Genetic screening-Panorama today History of uterine rupture History of low transverse cesarean section  PLAN: Prenatal care New OB counseling: The patient has been given an overview regarding routine prenatal care. Recommendations regarding diet, weight gain, and exercise in pregnancy were given. Prenatal testing,  genetic testing- collected today, and ultrasound use in pregnancy were reviewed.  Benefits of Breast Feeding were discussed. The patient is encouraged to consider nursing her baby post partum. See orders Reviewed red flag symptoms and when to call RTC x 4 weeks for ROB with Pattricia Boss or sooner if needed  Juliann Pares, [employer] 12/07/20 3:14 PM

## 2020-12-08 LAB — CBC
Hematocrit: 37 % (ref 34.0–46.6)
Hemoglobin: 12.5 g/dL (ref 11.1–15.9)
MCH: 30.9 pg (ref 26.6–33.0)
MCHC: 33.8 g/dL (ref 31.5–35.7)
MCV: 92 fL (ref 79–97)
Platelets: 250 10*3/uL (ref 150–450)
RBC: 4.04 x10E6/uL (ref 3.77–5.28)
RDW: 11.9 % (ref 11.7–15.4)
WBC: 9.9 10*3/uL (ref 3.4–10.8)

## 2020-12-09 LAB — URINE CULTURE

## 2020-12-13 ENCOUNTER — Other Ambulatory Visit: Payer: Self-pay

## 2020-12-13 ENCOUNTER — Telehealth: Payer: Self-pay

## 2020-12-13 DIAGNOSIS — Z3A08 8 weeks gestation of pregnancy: Secondary | ICD-10-CM

## 2020-12-13 DIAGNOSIS — Z3481 Encounter for supervision of other normal pregnancy, first trimester: Secondary | ICD-10-CM

## 2020-12-13 MED ORDER — DOXYLAMINE-PYRIDOXINE 10-10 MG PO TBEC: 10.0000 mg | DELAYED_RELEASE_TABLET | Freq: Every day | ORAL | 1 refills | Status: DC

## 2020-12-13 NOTE — Telephone Encounter (Signed)
Prescription refill request received from pharmacy. Informed pt she should have received 2 months worth of doxylamine-pyridoxine. Pt stated she was given a quantity of 30. Refill approved. Pt aware and verbalized understanding.

## 2020-12-17 ENCOUNTER — Telehealth: Payer: Self-pay

## 2020-12-17 NOTE — Telephone Encounter (Signed)
mychart message sent

## 2021-01-09 ENCOUNTER — Other Ambulatory Visit: Payer: Self-pay

## 2021-01-09 ENCOUNTER — Encounter: Payer: Self-pay | Admitting: Certified Nurse Midwife

## 2021-01-09 ENCOUNTER — Ambulatory Visit (INDEPENDENT_AMBULATORY_CARE_PROVIDER_SITE_OTHER): Payer: Medicaid Other | Admitting: Certified Nurse Midwife

## 2021-01-09 VITALS — BP 102/57 | HR 99 | Wt 127.5 lb

## 2021-01-09 DIAGNOSIS — Z23 Encounter for immunization: Secondary | ICD-10-CM | POA: Diagnosis not present

## 2021-01-09 DIAGNOSIS — Z3A17 17 weeks gestation of pregnancy: Secondary | ICD-10-CM

## 2021-01-09 LAB — POCT URINALYSIS DIPSTICK OB
Bilirubin, UA: NEGATIVE
Blood, UA: NEGATIVE
Glucose, UA: NEGATIVE
Ketones, UA: NEGATIVE
Leukocytes, UA: NEGATIVE
Nitrite, UA: NEGATIVE
POC,PROTEIN,UA: NEGATIVE
Spec Grav, UA: 1.01 (ref 1.010–1.025)
Urobilinogen, UA: 0.2 E.U./dL
pH, UA: 7.5 (ref 5.0–8.0)

## 2021-01-09 NOTE — Progress Notes (Signed)
ROB doing well. Thinks she is feeling fluttering but not sure yet. Discussed anatomy scan next visit. She verbalizes and agrees follow up 3 wk with Marcelino Duster.   Doreene Burke, CNM

## 2021-01-09 NOTE — Patient Instructions (Signed)

## 2021-01-31 ENCOUNTER — Other Ambulatory Visit: Payer: Self-pay

## 2021-01-31 ENCOUNTER — Ambulatory Visit (INDEPENDENT_AMBULATORY_CARE_PROVIDER_SITE_OTHER): Payer: Medicaid Other | Admitting: Certified Nurse Midwife

## 2021-01-31 ENCOUNTER — Ambulatory Visit (INDEPENDENT_AMBULATORY_CARE_PROVIDER_SITE_OTHER): Payer: Medicaid Other

## 2021-01-31 VITALS — BP 93/53 | HR 88 | Wt 133.3 lb

## 2021-01-31 DIAGNOSIS — Z3A17 17 weeks gestation of pregnancy: Secondary | ICD-10-CM | POA: Diagnosis not present

## 2021-01-31 DIAGNOSIS — Z3481 Encounter for supervision of other normal pregnancy, first trimester: Secondary | ICD-10-CM

## 2021-01-31 DIAGNOSIS — Z3A20 20 weeks gestation of pregnancy: Secondary | ICD-10-CM

## 2021-01-31 DIAGNOSIS — Z3689 Encounter for other specified antenatal screening: Secondary | ICD-10-CM

## 2021-01-31 DIAGNOSIS — Z87828 Personal history of other (healed) physical injury and trauma: Secondary | ICD-10-CM

## 2021-01-31 LAB — POCT URINALYSIS DIPSTICK OB
Bilirubin, UA: NEGATIVE
Blood, UA: NEGATIVE
Glucose, UA: NEGATIVE
Ketones, UA: NEGATIVE
Leukocytes, UA: NEGATIVE
Nitrite, UA: NEGATIVE
POC,PROTEIN,UA: NEGATIVE
Spec Grav, UA: 1.02 (ref 1.010–1.025)
Urobilinogen, UA: 0.2 E.U./dL
pH, UA: 5 (ref 5.0–8.0)

## 2021-01-31 NOTE — Patient Instructions (Signed)

## 2021-01-31 NOTE — Progress Notes (Addendum)
ROB-Doing well, no questions or concerns. Anatomy scan normal, but incomplete for fetal heart/profile. Naming daughter, Tammy Ward. Anticipatory guidance regarding course of prenatal care. Reviewed red flag symptoms and when to call. RTC x 2 weeks for follow up ultrasound. RTC x 4 weeks for ROB or sooner if needed.  ULTRASOUND REPORT  Location: [facility] Date of Service: 01/31/2021   Indications:Anatomy Ultrasound Findings:  Singleton intrauterine pregnancy is visualized with FHR at 160 BPM. Biometrics give an (U/S) Gestational age of [redacted]w[redacted]d and an (U/S) EDD of 06/15/2021; this correlates with the clinically established Estimated Date of Delivery: 06/18/21  Fetal presentation is transverse.  EFW: 369 g (13 oz). Fetal Percentile  Placenta: posterior. Grade: 1 AFI: subjectively normal.  Anatomic survey is incomplete for Fetal heart, profile and normal; Gender - female.    Right Ovary is normal in appearance. Left Ovary is normal appearance. Survey of the adnexa demonstrates no adnexal masses. There is no free peritoneal fluid in the cul de sac.  Impression: 1. 106w2d Viable Singleton Intrauterine pregnancy by U/S. 2. (U/S) EDD is consistent with Clinically established Estimated Date of Delivery: 06/18/21 . 3. Incomplete for fetal heart.  Recommendations: 1.Clinical correlation with the patient's History and Physical Exam.

## 2021-02-14 ENCOUNTER — Other Ambulatory Visit: Payer: Medicaid Other

## 2021-02-15 ENCOUNTER — Other Ambulatory Visit: Payer: Self-pay | Admitting: Certified Nurse Midwife

## 2021-02-15 DIAGNOSIS — Z3481 Encounter for supervision of other normal pregnancy, first trimester: Secondary | ICD-10-CM

## 2021-02-15 DIAGNOSIS — Z3A20 20 weeks gestation of pregnancy: Secondary | ICD-10-CM

## 2021-02-15 DIAGNOSIS — Z3689 Encounter for other specified antenatal screening: Secondary | ICD-10-CM

## 2021-02-20 ENCOUNTER — Other Ambulatory Visit: Payer: Medicaid Other

## 2021-02-21 ENCOUNTER — Other Ambulatory Visit: Payer: Self-pay | Admitting: Certified Nurse Midwife

## 2021-02-21 DIAGNOSIS — Z3A20 20 weeks gestation of pregnancy: Secondary | ICD-10-CM

## 2021-02-21 DIAGNOSIS — Z3492 Encounter for supervision of normal pregnancy, unspecified, second trimester: Secondary | ICD-10-CM

## 2021-02-21 DIAGNOSIS — Z3689 Encounter for other specified antenatal screening: Secondary | ICD-10-CM

## 2021-02-22 ENCOUNTER — Encounter: Payer: Medicaid Other | Admitting: Certified Nurse Midwife

## 2021-02-27 ENCOUNTER — Other Ambulatory Visit: Payer: Self-pay

## 2021-02-27 ENCOUNTER — Encounter: Payer: Self-pay | Admitting: Certified Nurse Midwife

## 2021-02-27 ENCOUNTER — Ambulatory Visit (INDEPENDENT_AMBULATORY_CARE_PROVIDER_SITE_OTHER): Payer: Medicaid Other | Admitting: Certified Nurse Midwife

## 2021-02-27 VITALS — BP 100/67 | HR 97 | Wt 139.3 lb

## 2021-02-27 DIAGNOSIS — Z3A24 24 weeks gestation of pregnancy: Secondary | ICD-10-CM

## 2021-02-27 LAB — POCT URINALYSIS DIPSTICK OB
Bilirubin, UA: NEGATIVE
Blood, UA: NEGATIVE
Glucose, UA: NEGATIVE
Ketones, UA: NEGATIVE
Leukocytes, UA: NEGATIVE
Nitrite, UA: NEGATIVE
Odor: NORMAL
POC,PROTEIN,UA: NEGATIVE
Spec Grav, UA: 1.02 (ref 1.010–1.025)
Urobilinogen, UA: 0.2 E.U./dL
pH, UA: 6 (ref 5.0–8.0)

## 2021-02-27 NOTE — Progress Notes (Signed)
ROB doing well. Feels good movement. Denies any concerns. Discussed 28 wk labs. She verbalizes and agree. Follow up 4 wks with Marcelino Duster for ROB.   Doreene Burke, CNM

## 2021-02-27 NOTE — Patient Instructions (Signed)
Oral Glucose Tolerance Test During Pregnancy Why am I having this test? The oral glucose tolerance test (OGTT) is done to check how your body processes blood sugar (glucose). This is one of several tests used to diagnose diabetes that develops during pregnancy (gestational diabetes mellitus). Gestational diabetes is a short-term form of diabetes that some women develop while they are pregnant. It usually occurs during the second trimester of pregnancy and goes away after delivery. Testing, or screening, for gestational diabetes usually occurs at weeks 24-28 of pregnancy. You may have the OGTT test after having a 1-hour glucose screening test if the results from that test indicate that you may have gestational diabetes. This test may also be needed if:  You have a history of gestational diabetes.  There is a history of giving birth to very large babies or of losing pregnancies (having stillbirths).  You have signs and symptoms of diabetes, such as: ? Changes in your eyesight. ? Tingling or numbness in your hands or feet. ? Changes in hunger, thirst, and urination, and these are not explained by your pregnancy. What is being tested? This test measures the amount of glucose in your blood at different times during a period of 3 hours. This shows how well your body can process glucose. What kind of sample is taken? Blood samples are required for this test. They are usually collected by inserting a needle into a blood vessel.   How do I prepare for this test?  For 3 days before your test, eat normally. Have plenty of carbohydrate-rich foods.  Follow instructions from your health care provider about: ? Eating or drinking restrictions on the day of the test. You may be asked not to eat or drink anything other than water (to fast) starting 8-10 hours before the test. ? Changing or stopping your regular medicines. Some medicines may interfere with this test. Tell a health care provider about:  All  medicines you are taking, including vitamins, herbs, eye drops, creams, and over-the-counter medicines.  Any blood disorders you have.  Any surgeries you have had.  Any medical conditions you have. What happens during the test? First, your blood glucose will be measured. This is referred to as your fasting blood glucose because you fasted before the test. Then, you will drink a glucose solution that contains a certain amount of glucose. Your blood glucose will be measured again 1, 2, and 3 hours after you drink the solution. This test takes about 3 hours to complete. You will need to stay at the testing location during this time. During the testing period:  Do not eat or drink anything other than the glucose solution.  Do not exercise.  Do not use any products that contain nicotine or tobacco, such as cigarettes, e-cigarettes, and chewing tobacco. These can affect your test results. If you need help quitting, ask your health care provider. The testing procedure may vary among health care providers and hospitals. How are the results reported? Your results will be reported as milligrams of glucose per deciliter of blood (mg/dL) or millimoles per liter (mmol/L). There is more than one source for screening and diagnosis reference values used to diagnose gestational diabetes. Your health care provider will compare your results to normal values that were established after testing a large group of people (reference values). Reference values may vary among labs and hospitals. For this test ([facility]), reference values are:  Fasting: 95 mg/dL (5.3 mmol/L).  1 hour: 180 mg/dL (10.0 mmol/L).  2 hour:   155 mg/dL (8.6 mmol/L).  3 hour: 140 mg/dL (7.8 mmol/L). What do the results mean? Results below the reference values are considered normal. If two or more of your blood glucose levels are at or above the reference values, you may be diagnosed with gestational diabetes. If only one level is  high, your health care provider may suggest repeat testing or other tests to confirm a diagnosis. Talk with your health care provider about what your results mean. Questions to ask your health care provider Ask your health care provider, or the department that is doing the test:  When will my results be ready?  How will I get my results?  What are my treatment options?  What other tests do I need?  What are my next steps? Summary  The oral glucose tolerance test (OGTT) is one of several tests used to diagnose diabetes that develops during pregnancy (gestational diabetes mellitus). Gestational diabetes is a short-term form of diabetes that some women develop while they are pregnant.  You may have the OGTT test after having a 1-hour glucose screening test if the results from that test show that you may have gestational diabetes. You may also have this test if you have any symptoms or risk factors for this type of diabetes.  Talk with your health care provider about what your results mean. This information is not intended to replace advice given to you by your health care provider. Make sure you discuss any questions you have with your health care provider. Document Revised: 05/17/2020 Document Reviewed: 05/17/2020 [employer] Patient Education  2021 [employer]  

## 2021-02-27 NOTE — Progress Notes (Signed)
ROB- no complaints.  

## 2021-02-28 ENCOUNTER — Ambulatory Visit: Admission: RE | Admit: 2021-02-28 | Payer: Medicaid Other | Source: Ambulatory Visit

## 2021-03-06 ENCOUNTER — Other Ambulatory Visit: Payer: Self-pay

## 2021-03-06 ENCOUNTER — Ambulatory Visit
Admission: RE | Admit: 2021-03-06 | Discharge: 2021-03-06 | Disposition: A | Discharge status: Home / Self Care | Payer: Medicaid Other | Source: Ambulatory Visit | Attending: Certified Nurse Midwife | Admitting: Certified Nurse Midwife

## 2021-03-06 DIAGNOSIS — Z3A20 20 weeks gestation of pregnancy: Secondary | ICD-10-CM

## 2021-03-06 DIAGNOSIS — Z3689 Encounter for other specified antenatal screening: Secondary | ICD-10-CM | POA: Insufficient documentation

## 2021-03-06 DIAGNOSIS — Z3A25 25 weeks gestation of pregnancy: Secondary | ICD-10-CM | POA: Diagnosis not present

## 2021-03-06 DIAGNOSIS — Z3492 Encounter for supervision of normal pregnancy, unspecified, second trimester: Secondary | ICD-10-CM

## 2021-03-28 ENCOUNTER — Other Ambulatory Visit: Payer: Self-pay

## 2021-03-28 ENCOUNTER — Other Ambulatory Visit: Payer: Medicaid Other

## 2021-03-28 ENCOUNTER — Ambulatory Visit (INDEPENDENT_AMBULATORY_CARE_PROVIDER_SITE_OTHER): Payer: Medicaid Other | Admitting: Certified Nurse Midwife

## 2021-03-28 ENCOUNTER — Encounter: Payer: Self-pay | Admitting: Certified Nurse Midwife

## 2021-03-28 VITALS — BP 115/71 | HR 94 | Wt 147.2 lb

## 2021-03-28 DIAGNOSIS — Z3483 Encounter for supervision of other normal pregnancy, third trimester: Secondary | ICD-10-CM

## 2021-03-28 DIAGNOSIS — Z23 Encounter for immunization: Secondary | ICD-10-CM

## 2021-03-28 DIAGNOSIS — Z3403 Encounter for supervision of normal first pregnancy, third trimester: Secondary | ICD-10-CM

## 2021-03-28 DIAGNOSIS — Z3A28 28 weeks gestation of pregnancy: Secondary | ICD-10-CM

## 2021-03-28 LAB — POCT URINALYSIS DIPSTICK OB
Bilirubin, UA: NEGATIVE
Blood, UA: NEGATIVE
Glucose, UA: NEGATIVE
Ketones, UA: NEGATIVE
Nitrite, UA: NEGATIVE
POC,PROTEIN,UA: NEGATIVE
Spec Grav, UA: 1.01 (ref 1.010–1.025)
Urobilinogen, UA: 0.2 E.U./dL
pH, UA: 7.5 (ref 5.0–8.0)

## 2021-03-28 MED ORDER — TETANUS-DIPHTH-ACELL PERTUSSIS 5-2.5-18.5 LF-MCG/0.5 IM SUSY
0.5000 mL | PREFILLED_SYRINGE | Freq: Once | INTRAMUSCULAR | Status: AC
  Administered 2021-03-28: 0.5 mL via INTRAMUSCULAR

## 2021-03-28 NOTE — Patient Instructions (Addendum)
Third Trimester of Pregnancy  The third trimester of pregnancy is from week 28 through week 40. This is also called months 7 through 9. This trimester is when your unborn baby (fetus) is growing very fast. At the end of the ninth month, the unborn baby is about 20 inches long. It weighs about 6-10 pounds. Body changes during your third trimester Your body continues to go through many changes during this time. The changes vary and generally return to normal after the baby is born. Physical changes  Your weight will continue to increase. You may gain 25-35 pounds (11-16 kg) by the end of the pregnancy. If you are underweight, you may gain 28-40 lb (about 13-18 kg). If you are overweight, you may gain 15-25 lb (about 7-11 kg).  You may start to get stretch marks on your hips, belly (abdomen), and breasts.  Your breasts will continue to grow and may hurt. A yellow fluid (colostrum) may leak from your breasts. This is the first milk you are making for your baby.  You may have changes in your hair.  Your belly button may stick out.  You may have more swelling in your hands, face, or ankles. Health changes  You may have heartburn.  You may have trouble pooping (constipation).  You may get hemorrhoids. These are swollen veins in the butt that can itch or get painful.  You may have swollen veins (varicose veins) in your legs.  You may have more body aches in the pelvis, back, or thighs.  You may have more tingling or numbness in your hands, arms, and legs. The skin on your belly may also feel numb.  You may feel short of breath as your womb (uterus) gets bigger. Other changes  You may pee (urinate) more often.  You may have more problems sleeping.  You may notice the unborn baby "dropping," or moving lower in your belly.  You may have more discharge coming from your vagina.  Your joints may feel loose, and you may have pain around your pelvic bone. Follow these instructions at  home: Medicines  Take over-the-counter and prescription medicines only as told by your doctor. Some medicines are not safe during pregnancy.  Take a prenatal vitamin that contains at least 600 micrograms (mcg) of folic acid. Eating and drinking  Eat healthy meals that include: ? Fresh fruits and vegetables. ? Whole grains. ? Good sources of protein, such as meat, eggs, or tofu. ? Low-fat dairy products.  Avoid raw meat and unpasteurized juice, milk, and cheese. These carry germs that can harm you and your baby.  Eat 4 or 5 small meals rather than 3 large meals a day.  You may need to take these actions to prevent or treat trouble pooping: ? Drink enough fluids to keep your pee (urine) pale yellow. ? Eat foods that are high in fiber. These include beans, whole grains, and fresh fruits and vegetables. ? Limit foods that are high in fat and sugar. These include fried or sweet foods. Activity  Exercise only as told by your doctor. Stop exercising if you start to have cramps in your womb.  Avoid heavy lifting.  Do not exercise if it is too hot or too humid, or if you are in a place of great height (high altitude).  If you choose to, you may have sex unless your doctor tells you not to. Relieving pain and discomfort  Take breaks often, and rest with your legs raised (elevated) if you have   leg cramps or low back pain.  Take warm water baths (sitz baths) to soothe pain or discomfort caused by hemorrhoids. Use hemorrhoid cream if your doctor approves.  Wear a good support bra if your breasts are tender.  If you develop bulging, swollen veins in your legs: ? Wear support hose as told by your doctor. ? Raise your feet for 15 minutes, 3-4 times a day. ? Limit salt in your food. Safety  Talk to your doctor before traveling far distances.  Do not use hot tubs, steam rooms, or saunas.  Wear your seat belt at all times when you are in a car.  Talk with your doctor if someone is  hurting you or yelling at you a lot. Preparing for your baby's arrival To prepare for the arrival of your baby:  Take prenatal classes.  Visit the hospital and tour the maternity area.  Buy a rear-facing car seat. Learn how to install it in your car.  Prepare the baby's room. Take out all pillows and stuffed animals from the baby's crib. General instructions  Avoid cat litter boxes and soil used by cats. These carry germs that can cause harm to the baby and can cause a loss of your baby by miscarriage or stillbirth.  Do not douche or use tampons. Do not use scented sanitary pads.  Do not smoke or use any products that contain nicotine or tobacco. If you need help quitting, ask your doctor.  Do not drink alcohol.  Do not use herbal medicines, illegal drugs, or medicines that were not approved by your doctor. Chemicals in these products can affect your baby.  Keep all follow-up visits. This is important. Where to find more information  American Pregnancy Association: americanpregnancy.org  [employer]: www.acog.org  Office on Women's Health: KeywordPortfolios.com.br Contact a doctor if:  You have a fever.  You have mild cramps or pressure in your lower belly.  You have a nagging pain in your belly area.  You vomit, or you have watery poop (diarrhea).  You have bad-smelling fluid coming from your vagina.  You have pain when you pee, or your pee smells bad.  You have a headache that does not go away when you take medicine.  You have changes in how you see, or you see spots in front of your eyes. Get help right away if:  Your water breaks.  You have regular contractions that are less than 5 minutes apart.  You are spotting or bleeding from your vagina.  You have very bad belly cramps or pain.  You have trouble breathing.  You have chest pain.  You faint.  You have not felt the baby move for the amount of time told by  your doctor.  You have new or increased pain, swelling, or redness in an arm or leg. Summary  The third trimester is from week 28 through week 40 (months 7 through 9). This is the time when your unborn baby is growing very fast.  During this time, your discomfort may increase as you gain weight and as your baby grows.  Get ready for your baby to arrive by taking prenatal classes, buying a rear-facing car seat, and preparing the baby's room.  Get help right away if you are bleeding from your vagina, you have chest pain and trouble breathing, or you have not felt the baby move for the amount of time told by your doctor. This information is not intended to replace advice given  to you by your health care provider. Make sure you discuss any questions you have with your health care provider. Document Revised: 05/16/2020 Document Reviewed: 03/22/2020 Elsevier Patient Education  [place]. Common Medications Safe in Pregnancy  Acne:      Constipation:  Benzoyl Peroxide     Colace  Clindamycin      Dulcolax Suppository  Topica Erythromycin     Fibercon  Salicylic Acid      Metamucil         Miralax AVOID:        Senakot   Accutane    Cough:  Retin-A       Cough Drops  Tetracycline      Phenergan w/ Codeine if Rx  Minocycline      Robitussin (Plain & DM)  Antibiotics:     Crabs/Lice:  Ceclor       RID  Cephalosporins    AVOID:  E-Mycins      Kwell  Keflex  Macrobid/Macrodantin   Diarrhea:  Penicillin      Kao-Pectate  Zithromax      Imodium AD         PUSH FLUIDS AVOID:       Cipro     Fever:  Tetracycline      Tylenol (Regular or Extra  Minocycline       Strength)  Levaquin      Extra Strength-Do not          Exceed 8 tabs/24 hrs Caffeine:        <216m/day (equiv. To 1 cup of coffee or  approx. 3 12 oz  sodas)         Gas: Cold/Hayfever:       Gas-X  Benadryl      Mylicon  Claritin       Phazyme  **Claritin-D        Chlor-Trimeton    Headaches:  Dimetapp      ASA-Free Excedrin  Drixoral-Non-Drowsy     Cold Compress  Mucinex (Guaifenasin)     Tylenol (Regular or Extra  Sudafed/Sudafed-12 Hour     Strength)  **Sudafed PE Pseudoephedrine   Tylenol Cold & Sinus     Vicks Vapor Rub  Zyrtec  **AVOID if Problems With Blood Pressure         Heartburn: Avoid lying down for at least 1 hour after meals  Aciphex      Maalox     Rash:  Milk of Magnesia     Benadryl    Mylanta       1% Hydrocortisone Cream  Pepcid  Pepcid Complete   Sleep Aids:  Prevacid      Ambien   Prilosec       Benadryl  Rolaids       Chamomile Tea  Tums (Limit 4/day)     Unisom         Tylenol PM         Warm milk-add vanilla or  Hemorrhoids:       Sugar for taste  Anusol/Anusol H.C.  (RX: Analapram 2.5%)  Sugar Substitutes:  Hydrocortisone OTC     Ok in moderation  Preparation H      Tucks        Vaseline lotion applied to tissue with wiping    Herpes:     Throat:  Acyclovir      Oragel  Famvir  Valtrex     Vaccines:  Flu Shot Leg Cramps:       *Gardasil  Benadryl      Hepatitis A         Hepatitis B Nasal Spray:       Pneumovax  Saline Nasal Spray     Polio Booster         Tetanus Nausea:       Tuberculosis test or PPD  Vitamin B6 25 mg TID   AVOID:    Dramamine      *Gardasil  Emetrol       Live Poliovirus  Ginger Root 250 mg QID    MMR (measles, mumps &  High Complex Carbs @ Bedtime    rebella)  Sea Bands-Accupressure    Varicella (Chickenpox)  Unisom 1/2 tab TID     *No known complications           If received before Pain:         Known pregnancy;   Darvocet       Resume series after  Lortab        Delivery  Percocet    Yeast:   Tramadol      Femstat  Tylenol 3      Gyne-lotrimin  Ultram       Monistat  Vicodin           MISC:         All Sunscreens           Hair  Coloring/highlights          Insect Repellant's          (Including DEET)         Mystic Tans Breastfeeding  Choosing to breastfeed is one of the best decisions you can make for yourself and your baby. A change in hormones during pregnancy causes your breasts to make breast milk in your milk-producing glands. Hormones prevent breast milk from being released before your baby is born. They also prompt milk flow after birth. Once breastfeeding has begun, thoughts of your baby, as well as his or her sucking or crying, can stimulate the release of milk from your milk-producing glands. Benefits of breastfeeding Research shows that breastfeeding offers many health benefits for infants and mothers. It also offers a cost-free and convenient way to feed your baby. For your baby  Your first milk (colostrum) helps your baby's digestive system to function better.  Special cells in your milk (antibodies) help your baby to fight off infections.  Breastfed babies are less likely to develop asthma, allergies, obesity, or type 2 diabetes. They are also at lower risk for sudden infant death syndrome (SIDS).  Nutrients in breast milk are better able to meet your baby's needs compared to infant formula.  Breast milk improves your baby's brain development. For you  Breastfeeding helps to create a very special bond between you and your baby.  Breastfeeding is convenient. Breast milk costs nothing and is always available at the correct temperature.  Breastfeeding helps to burn calories. It helps you to lose the weight that you gained during pregnancy.  Breastfeeding makes your uterus return faster to its size before pregnancy. It also slows bleeding (lochia) after you give birth.  Breastfeeding helps to lower your risk of developing type 2 diabetes, osteoporosis, rheumatoid arthritis, cardiovascular disease, and breast, ovarian, uterine, and endometrial cancer later in life. Breastfeeding basics Starting  breastfeeding  Find a comfortable place to sit or lie down, with your neck and back well-supported.  Place  a pillow or a rolled-up blanket under your baby to bring him or her to the level of your breast (if you are seated). Nursing pillows are specially designed to help support your arms and your baby while you breastfeed.  Make sure that your baby's tummy (abdomen) is facing your abdomen.  Gently massage your breast. With your fingertips, massage from the outer edges of your breast inward toward the nipple. This encourages milk flow. If your milk flows slowly, you may need to continue this action during the feeding.  Support your breast with 4 fingers underneath and your thumb above your nipple (make the letter "C" with your hand). Make sure your fingers are well away from your nipple and your baby's mouth.  Stroke your baby's lips gently with your finger or nipple.  When your baby's mouth is open wide enough, quickly bring your baby to your breast, placing your entire nipple and as much of the areola as possible into your baby's mouth. The areola is the colored area around your nipple. ? More areola should be visible above your baby's upper lip than below the lower lip. ? Your baby's lips should be opened and extended outward (flanged) to ensure an adequate, comfortable latch. ? Your baby's tongue should be between his or her lower gum and your breast.  Make sure that your baby's mouth is correctly positioned around your nipple (latched). Your baby's lips should create a seal on your breast and be turned out (everted).  It is common for your baby to suck about 2-3 minutes in order to start the flow of breast milk. Latching Teaching your baby how to latch onto your breast properly is very important. An improper latch can cause nipple pain, decreased milk supply, and poor weight gain in your baby. Also, if your baby is not latched onto your nipple properly, he or she may swallow some air  during feeding. This can make your baby fussy. Burping your baby when you switch breasts during the feeding can help to get rid of the air. However, teaching your baby to latch on properly is still the best way to prevent fussiness from swallowing air while breastfeeding. Signs that your baby has successfully latched onto your nipple  Silent tugging or silent sucking, without causing you pain. Infant's lips should be extended outward (flanged).  Swallowing heard between every 3-4 sucks once your milk has started to flow (after your let-down milk reflex occurs).  Muscle movement above and in front of his or her ears while sucking. Signs that your baby has not successfully latched onto your nipple  Sucking sounds or smacking sounds from your baby while breastfeeding.  Nipple pain. If you think your baby has not latched on correctly, slip your finger into the corner of your baby's mouth to break the suction and place it between your baby's gums. Attempt to start breastfeeding again. Signs of successful breastfeeding Signs from your baby  Your baby will gradually decrease the number of sucks or will completely stop sucking.  Your baby will fall asleep.  Your baby's body will relax.  Your baby will retain a small amount of milk in his or her mouth.  Your baby will let go of your breast by himself or herself. Signs from you  Breasts that have increased in firmness, weight, and size 1-3 hours after feeding.  Breasts that are softer immediately after breastfeeding.  Increased milk volume, as well as a change in milk consistency and color by the fifth  day of breastfeeding.  Nipples that are not sore, cracked, or bleeding. Signs that your baby is getting enough milk  Wetting at least 1-2 diapers during the first 24 hours after birth.  Wetting at least 5-6 diapers every 24 hours for the first week after birth. The urine should be clear or pale yellow by the age of 5 days.  Wetting 6-8  diapers every 24 hours as your baby continues to grow and develop.  At least 3 stools in a 24-hour period by the age of 5 days. The stool should be soft and yellow.  At least 3 stools in a 24-hour period by the age of 7 days. The stool should be seedy and yellow.  No loss of weight greater than 10% of birth weight during the first 3 days of life.  Average weight gain of 4-7 oz (113-198 g) per week after the age of 4 days.  Consistent daily weight gain by the age of 5 days, without weight loss after the age of 2 weeks. After a feeding, your baby may spit up a small amount of milk. This is normal. Breastfeeding frequency and duration Frequent feeding will help you make more milk and can prevent sore nipples and extremely full breasts (breast engorgement). Breastfeed when you feel the need to reduce the fullness of your breasts or when your baby shows signs of hunger. This is called "breastfeeding on demand." Signs that your baby is hungry include:  Increased alertness, activity, or restlessness.  Movement of the head from side to side.  Opening of the mouth when the corner of the mouth or cheek is stroked (rooting).  Increased sucking sounds, smacking lips, cooing, sighing, or squeaking.  Hand-to-mouth movements and sucking on fingers or hands.  Fussing or crying. Avoid introducing a pacifier to your baby in the first 4-6 weeks after your baby is born. After this time, you may choose to use a pacifier. Research has shown that pacifier use during the first year of a baby's life decreases the risk of sudden infant death syndrome (SIDS). Allow your baby to feed on each breast as long as he or she wants. When your baby unlatches or falls asleep while feeding from the first breast, offer the second breast. Because newborns are often sleepy in the first few weeks of life, you may need to awaken your baby to get him or her to feed. Breastfeeding times will vary from baby to baby. However, the  following rules can serve as a guide to help you make sure that your baby is properly fed:  Newborns (babies 20 weeks of age or younger) may breastfeed every 1-3 hours.  Newborns should not go without breastfeeding for longer than 3 hours during the day or 5 hours during the night.  You should breastfeed your baby a minimum of 8 times in a 24-hour period. Breast milk pumping Pumping and storing breast milk allows you to make sure that your baby is exclusively fed your breast milk, even at times when you are unable to breastfeed. This is especially important if you go back to work while you are still breastfeeding, or if you are not able to be present during feedings. Your lactation consultant can help you find a method of pumping that works best for you and give you guidelines about how long it is safe to store breast milk.      Caring for your breasts while you breastfeed Nipples can become dry, cracked, and sore while breastfeeding. The  following recommendations can help keep your breasts moisturized and healthy:  Avoid using soap on your nipples.  Wear a supportive bra designed especially for nursing. Avoid wearing underwire-style bras or extremely tight bras (sports bras).  Air-dry your nipples for 3-4 minutes after each feeding.  Use only cotton bra pads to absorb leaked breast milk. Leaking of breast milk between feedings is normal.  Use lanolin on your nipples after breastfeeding. Lanolin helps to maintain your skin's normal moisture barrier. Pure lanolin is not harmful (not toxic) to your baby. You may also hand express a few drops of breast milk and gently massage that milk into your nipples and allow the milk to air-dry. In the first few weeks after giving birth, some women experience breast engorgement. Engorgement can make your breasts feel heavy, warm, and tender to the touch. Engorgement peaks within 3-5 days after you give birth. The following recommendations can help to ease  engorgement:  Completely empty your breasts while breastfeeding or pumping. You may want to start by applying warm, moist heat (in the shower or with warm, water-soaked hand towels) just before feeding or pumping. This increases circulation and helps the milk flow. If your baby does not completely empty your breasts while breastfeeding, pump any extra milk after he or she is finished.  Apply ice packs to your breasts immediately after breastfeeding or pumping, unless this is too uncomfortable for you. To do this: ? Put ice in a plastic bag. ? Place a towel between your skin and the bag. ? Leave the ice on for 20 minutes, 2-3 times a day.  Make sure that your baby is latched on and positioned properly while breastfeeding. If engorgement persists after 48 hours of following these recommendations, contact your health care provider or a Science writer. Overall health care recommendations while breastfeeding  Eat 3 healthy meals and 3 snacks every day. Well-nourished mothers who are breastfeeding need an additional 450-500 calories a day. You can meet this requirement by increasing the amount of a balanced diet that you eat.  Drink enough water to keep your urine pale yellow or clear.  Rest often, relax, and continue to take your prenatal vitamins to prevent fatigue, stress, and low vitamin and mineral levels in your body (nutrient deficiencies).  Do not use any products that contain nicotine or tobacco, such as cigarettes and e-cigarettes. Your baby may be harmed by chemicals from cigarettes that pass into breast milk and exposure to secondhand smoke. If you need help quitting, ask your health care provider.  Avoid alcohol.  Do not use illegal drugs or marijuana.  Talk with your health care provider before taking any medicines. These include over-the-counter and prescription medicines as well as vitamins and herbal supplements. Some medicines that may be harmful to your baby can pass  through breast milk.  It is possible to become pregnant while breastfeeding. If birth control is desired, ask your health care provider about options that will be safe while breastfeeding your baby. Where to find more information: [employer]: www.llli.org Contact a health care provider if:  You feel like you want to stop breastfeeding or have become frustrated with breastfeeding.  Your nipples are cracked or bleeding.  Your breasts are red, tender, or warm.  You have: ? Painful breasts or nipples. ? A swollen area on either breast. ? A fever or chills. ? Nausea or vomiting. ? Drainage other than breast milk from your nipples.  Your breasts do not become full before  feedings by the fifth day after you give birth.  You feel sad and depressed.  Your baby is: ? Too sleepy to eat well. ? Having trouble sleeping. ? More than 75 week old and wetting fewer than 6 diapers in a 24-hour period. ? Not gaining weight by 66 days of age.  Your baby has fewer than 3 stools in a 24-hour period.  Your baby's skin or the white parts of his or her eyes become yellow. Get help right away if:  Your baby is overly tired (lethargic) and does not want to wake up and feed.  Your baby develops an unexplained fever. Summary  Breastfeeding offers many health benefits for infant and mothers.  Try to breastfeed your infant when he or she shows early signs of hunger.  Gently tickle or stroke your baby's lips with your finger or nipple to allow the baby to open his or her mouth. Bring the baby to your breast. Make sure that much of the areola is in your baby's mouth. Offer one side and burp the baby before you offer the other side.  Talk with your health care provider or lactation consultant if you have questions or you face problems as you breastfeed. This information is not intended to replace advice given to you by your health care provider. Make sure you discuss any questions you  have with your health care provider. Document Revised: 03/04/2018 Document Reviewed: 01/09/2017 Elsevier Patient Education  2021 [employer].   Tdap (Tetanus, Diphtheria, Pertussis) Vaccine: What You Need to Know 1. Why get vaccinated? Tdap vaccine can prevent tetanus, diphtheria, and pertussis. Diphtheria and pertussis spread from person to person. Tetanus enters the body through cuts or wounds.  TETANUS (T) causes painful stiffening of the muscles. Tetanus can lead to serious health problems, including being unable to open the mouth, having trouble swallowing and breathing, or death.  DIPHTHERIA (D) can lead to difficulty breathing, heart failure, paralysis, or death.  PERTUSSIS (aP), also known as "whooping cough," can cause uncontrollable, violent coughing that makes it hard to breathe, eat, or drink. Pertussis can be extremely serious especially in babies and young children, causing pneumonia, convulsions, brain damage, or death. In teens and adults, it can cause weight loss, loss of bladder control, passing out, and rib fractures from severe coughing. 2. Tdap vaccine Tdap is only for children 7 years and older, adolescents, and adults.  Adolescents should receive a single dose of Tdap, preferably at age 53 or 63 years. Pregnant people should get a dose of Tdap during every pregnancy, preferably during the early part of the third trimester, to help protect the newborn from pertussis. Infants are most at risk for severe, life-threatening complications from pertussis. Adults who have never received Tdap should get a dose of Tdap. Also, adults should receive a booster dose of either Tdap or Td (a different vaccine that protects against tetanus and diphtheria but not pertussis) every 10 years, or after 5 years in the case of a severe or dirty wound or burn. Tdap may be given at the same time as other vaccines. 3. Talk with your health care provider Tell your vaccine provider if the person  getting the vaccine:  Has had an allergic reaction after a previous dose of any vaccine that protects against tetanus, diphtheria, or pertussis, or has any severe, life-threatening allergies  Has had a coma, decreased level of consciousness, or prolonged seizures within 7 days after a previous dose of any pertussis vaccine (DTP, DTaP,  or Tdap)  Has seizures or another nervous system problem  Has ever had Guillain-Barr Syndrome (also called "GBS")  Has had severe pain or swelling after a previous dose of any vaccine that protects against tetanus or diphtheria In some cases, your health care provider may decide to postpone Tdap vaccination until a future visit. People with minor illnesses, such as a cold, may be vaccinated. People who are moderately or severely ill should usually wait until they recover before getting Tdap vaccine.  Your health care provider can give you more information. 4. Risks of a vaccine reaction  Pain, redness, or swelling where the shot was given, mild fever, headache, feeling tired, and nausea, vomiting, diarrhea, or stomachache sometimes happen after Tdap vaccination. People sometimes faint after medical procedures, including vaccination. Tell your provider if you feel dizzy or have vision changes or ringing in the ears.  As with any medicine, there is a very remote chance of a vaccine causing a severe allergic reaction, other serious injury, or death. 5. What if there is a serious problem? An allergic reaction could occur after the vaccinated person leaves the clinic. If you see signs of a severe allergic reaction (hives, swelling of the face and throat, difficulty breathing, a fast heartbeat, dizziness, or weakness), call 9-1-1 and get the person to the nearest hospital. For other signs that concern you, call your health care provider.  Adverse reactions should be reported to the Vaccine Adverse Event Reporting System (VAERS). Your health care provider will usually  file this report, or you can do it yourself. Visit the VAERS website at www.vaers.SamedayNews.es or call (212)432-3533. VAERS is only for reporting reactions, and VAERS staff members do not give medical advice. 6. The National Vaccine Injury Compensation Program The [employer] Vaccine Injury Compensation Program (VICP) is a federal program that was created to compensate people who may have been injured by certain vaccines. Claims regarding alleged injury or death due to vaccination have a time limit for filing, which may be as short as two years. Visit the VICP website at GoldCloset.com.ee or call (571)652-6351 to learn about the program and about filing a claim. 7. How can I learn more?  Ask your health care provider.  Call your local or state health department.  Visit the website of the Food and Drug Administration (FDA) for vaccine package inserts and additional information at TraderRating.uy.  Contact the Centers for Disease Control and Prevention (CDC): ? Call 804-542-7672 (1-800-CDC-INFO) or ? Visit CDC's website at http://hunter.com/. Vaccine Information Statement Tdap (Tetanus, Diphtheria, Pertussis) Vaccine (07/27/2020) This information is not intended to replace advice given to you by your health care provider. Make sure you discuss any questions you have with your health care provider. Document Revised: 08/22/2020 Document Reviewed: 08/22/2020 Elsevier Patient Education  2021 [employer].

## 2021-03-28 NOTE — Progress Notes (Signed)
OB-Pt present for routine prenatal care. Pt had 1 hour gt, tdap and signed blood transfusion consent form. Pt stated noticing sharp pains in the vaginal area after sex, denies any bleeding or contractions.

## 2021-03-28 NOTE — Progress Notes (Signed)
I have seen, interviewed, and examined the patient in conjunction with the [employer] and affirm the diagnosis and management plan.   Gunnar Bulla, CNM [facility], [facility] 03/28/21 5:31 PM

## 2021-03-28 NOTE — Progress Notes (Signed)
ROB- Doing well overall, reports single episode of sharp vaginal pain after intercourse. Patient instructed to let us know if it reoccurs. TDaP given, see orders. Blood transfusion and bilateral tubal ligation consent signed.. Breastfeeding education and overview/plan completed, see chart. 28 weke labs today. Third trimester handouts given today. Anticipatory guidance regarding course of prenatal care. Reviewed red flag symptoms and when to call. RTC x 2 weeks for ROB with ANNIE; RTC x 4 weeks for ROB with JML or sooner if needed.  Juliann Pares, Student-MidWife [employer] 03/28/21 2:56 PM

## 2021-03-29 LAB — RPR: RPR Ser Ql: NONREACTIVE

## 2021-03-29 LAB — CBC
Hematocrit: 30.9 % — ABNORMAL LOW (ref 34.0–46.6)
Hemoglobin: 10.5 g/dL — ABNORMAL LOW (ref 11.1–15.9)
MCH: 30.6 pg (ref 26.6–33.0)
MCHC: 34 g/dL (ref 31.5–35.7)
MCV: 90 fL (ref 79–97)
Platelets: 186 10*3/uL (ref 150–450)
RBC: 3.43 x10E6/uL — ABNORMAL LOW (ref 3.77–5.28)
RDW: 12.6 % (ref 11.7–15.4)
WBC: 11.1 10*3/uL — ABNORMAL HIGH (ref 3.4–10.8)

## 2021-03-29 LAB — GLUCOSE, 1 HOUR GESTATIONAL: Gestational Diabetes Screen: 131 mg/dL (ref 65–139)

## 2021-03-30 ENCOUNTER — Other Ambulatory Visit: Payer: Self-pay | Admitting: Certified Nurse Midwife

## 2021-03-30 MED ORDER — FUSION PLUS PO CAPS: 1.0000 | ORAL_CAPSULE | Freq: Every day | ORAL | 6 refills | Status: DC

## 2021-04-10 ENCOUNTER — Other Ambulatory Visit: Payer: Self-pay

## 2021-04-10 ENCOUNTER — Encounter: Payer: Self-pay | Admitting: Certified Nurse Midwife

## 2021-04-10 ENCOUNTER — Encounter: Payer: Medicaid Other | Admitting: Certified Nurse Midwife

## 2021-04-10 ENCOUNTER — Ambulatory Visit (INDEPENDENT_AMBULATORY_CARE_PROVIDER_SITE_OTHER): Payer: Medicaid Other | Admitting: Certified Nurse Midwife

## 2021-04-10 VITALS — BP 93/60 | HR 89 | Wt 149.0 lb

## 2021-04-10 DIAGNOSIS — Z3A30 30 weeks gestation of pregnancy: Secondary | ICD-10-CM

## 2021-04-10 DIAGNOSIS — Z3483 Encounter for supervision of other normal pregnancy, third trimester: Secondary | ICD-10-CM

## 2021-04-10 DIAGNOSIS — Z3403 Encounter for supervision of normal first pregnancy, third trimester: Secondary | ICD-10-CM

## 2021-04-10 LAB — POCT URINALYSIS DIPSTICK OB
Bilirubin, UA: NEGATIVE
Blood, UA: NEGATIVE
Glucose, UA: NEGATIVE
Ketones, UA: NEGATIVE
Leukocytes, UA: NEGATIVE
Nitrite, UA: NEGATIVE
POC,PROTEIN,UA: NEGATIVE
Spec Grav, UA: 1.01 (ref 1.010–1.025)
Urobilinogen, UA: 0.2 E.U./dL
pH, UA: 7 (ref 5.0–8.0)

## 2021-04-10 NOTE — Progress Notes (Signed)
Patient comes in today for ROB visit. Patient has been having pelvic pressure.

## 2021-04-10 NOTE — Patient Instructions (Signed)
Preterm Labor Pregnancy normally lasts 39-41 weeks. Preterm labor is when labor starts before you have been pregnant for 37 weeks. Babies who are born too early may have problems with blood sugar, body temperature, heart, and breathing. These problems may be very serious in babies who are born before 34 weeks of pregnancy. What are the causes? The cause of this condition is not known. What increases the risk? You are more likely to have preterm labor if:  You have medical problems, now or in the past.  You have problems now or in your past pregnancies.  You have lifestyle problems. Medical history  You have problems of the womb (uterus).  You have an infection, including infections you get from sex.  You have problems that do not go away, such as: ? Blood clots. ? High blood pressure. ? High blood sugar.  You have low body weight or too much body weight. Present and past pregnancies  You have had preterm labor before.  You are pregnant with two babies or more.  You have a condition in which the placenta covers your cervix.  You waited less than 6 months between giving birth and becoming pregnant again.  Your unborn baby has some problems.  You have bleeding from your vagina.  You became pregnant by a method called IVF. Lifestyle  You smoke.  You drink alcohol.  You use drugs.  You have stress.  You have abuse in your home.  You come in contact with chemicals that harm the body (pollutants). Other factors  You are younger than 17 years or older than 35 years. What are the signs or symptoms? Symptoms of this condition include:  Cramps. The cramps may feel like cramps from a period.  You may have watery poop (diarrhea).  Pain in the belly (abdomen).  Pain in the lower back.  Regular contractions. It may feel like your belly is getting tighter.  Pressure in the lower belly.  More fluid leaking from the vagina. The fluid may be watery or  bloody.  Water breaking. How is this treated? Treatment for this condition depends on your health, the health of your baby, and how old your pregnancy is. It may include:  Taking medicines, such as: ? Hormone medicines. ? Medicines to stop contractions. ? Medicines to help mature the baby's lungs. ? Medicines to prevent your baby from getting cerebral palsy.  Bed rest. If the labor happens before 34 weeks of pregnancy, you may need to stay in the hospital.  Delivering the baby. Follow these instructions at home:  Do not use any products that contain nicotine or tobacco, such as cigarettes, e-cigarettes, and chewing tobacco. If you need help quitting, ask your doctor.  Do not drink alcohol.  Take over-the-counter and prescription medicines only as told by your doctor.  Rest as told by your doctor.  Return to your activities as told by your doctor. Ask your doctor what activities are safe for you.  Keep all follow-up visits as told by your doctor. This is important.   How is this prevented? To have a healthy pregnancy:  Do not use street drugs.  Do not use any medicines unless you ask your doctor if they are safe for you.  Talk with your doctor before taking any herbal supplements.  Make sure you gain enough weight.  Watch for infection. If you think you might have an infection, get it checked right away. Symptoms of infection may include: ? Fever. ? Vaginal discharge. ?   Pain or burning when you pee. ? Needing to pee urgently. ? Needing to pee often. ? Peeing small amounts often. ? Blood in your pee. ? Pee that smells bad or unusual.  Tell your doctor if you have gone into preterm labor before. Contact a doctor if:  You think you are going into preterm labor.  You have symptoms of preterm labor.  You have symptoms of infection. Get help right away if:  You are having painful contractions every 5 minutes or less.  Your water breaks. Summary  Preterm labor  is labor that starts before you reach 37 weeks of pregnancy.  Your baby may have problems if delivered early.  The cause of preterm labor is not known. Having problems of the womb (uterus), an infection, or bleeding during pregnancy increases the risk.  Contact a doctor if you have signs or symptoms of preterm labor. This information is not intended to replace advice given to you by your health care provider. Make sure you discuss any questions you have with your health care provider. Document Revised: 01/10/2020 Document Reviewed: 01/10/2020 Elsevier Patient Education  2021 Elsevier Inc.  

## 2021-04-10 NOTE — Progress Notes (Signed)
ROB doing well. Feels good movement. Discussed RSB, see checklist for topics reviewed. Pt asked about scheduling of c section. State that she was having it done with Dr. Logan Bores will follow up with him in regards to time of pre operative visit. ROB in 2 wks with Marcelino Duster or prn.   Doreene Burke, CNM

## 2021-04-25 ENCOUNTER — Other Ambulatory Visit: Payer: Self-pay

## 2021-04-25 ENCOUNTER — Ambulatory Visit (INDEPENDENT_AMBULATORY_CARE_PROVIDER_SITE_OTHER): Payer: Medicaid Other | Admitting: Certified Nurse Midwife

## 2021-04-25 VITALS — BP 90/61 | HR 111 | Wt 151.6 lb

## 2021-04-25 DIAGNOSIS — Z87828 Personal history of other (healed) physical injury and trauma: Secondary | ICD-10-CM

## 2021-04-25 DIAGNOSIS — Z3403 Encounter for supervision of normal first pregnancy, third trimester: Secondary | ICD-10-CM

## 2021-04-25 DIAGNOSIS — O09893 Supervision of other high risk pregnancies, third trimester: Secondary | ICD-10-CM

## 2021-04-25 DIAGNOSIS — Z3A32 32 weeks gestation of pregnancy: Secondary | ICD-10-CM

## 2021-04-25 LAB — POCT URINALYSIS DIPSTICK OB
Bilirubin, UA: NEGATIVE
Blood, UA: NEGATIVE
Glucose, UA: NEGATIVE
Ketones, UA: NEGATIVE
Leukocytes, UA: NEGATIVE
Nitrite, UA: NEGATIVE
POC,PROTEIN,UA: NEGATIVE
Spec Grav, UA: 1.015 (ref 1.010–1.025)
Urobilinogen, UA: 0.2 E.U./dL
pH, UA: 7 (ref 5.0–8.0)

## 2021-04-25 NOTE — Progress Notes (Signed)
ROB-Doing well, no questions or concerns. Anticipatory guidance regarding course of prenatal care. Reviewed red flag symptoms and when to call. RTC x 2 weeks for Pre-Op/ROB with Dr. Logan Bores. RTC x 4 weeks for ROB with JML or sooner if needed.

## 2021-04-25 NOTE — Progress Notes (Signed)
ROB: She is doing well, not new concerns today.

## 2021-04-25 NOTE — Patient Instructions (Signed)
Fetal Movement Counts Patient Name: ________________________________________________ Patient Due Date: ____________________  What is a fetal movement count? A fetal movement count is the number of times that you feel your baby move during a certain amount of time. This may also be called a fetal kick count. A fetal movement count is recommended for every pregnant woman. You may be asked to start counting fetal movements as early as week 28 of your pregnancy. Pay attention to when your baby is most active. You may notice your baby's sleep and wake cycles. You may also notice things that make your baby move more. You should do a fetal movement count:  When your baby is normally most active.  At the same time each day. A good time to count movements is while you are resting, after having something to eat and drink. How do I count fetal movements? 1. Find a quiet, comfortable area. Sit, or lie down on your side. 2. Write down the date, the start time and stop time, and the number of movements that you felt between those two times. Take this information with you to your health care visits. 3. Write down your start time when you feel the first movement. 4. Count kicks, flutters, swishes, rolls, and jabs. You should feel at least 10 movements. 5. You may stop counting after you have felt 10 movements, or if you have been counting for 2 hours. Write down the stop time. 6. If you do not feel 10 movements in 2 hours, contact your health care provider for further instructions. Your health care provider may want to do additional tests to assess your baby's well-being. Contact a health care provider if:  You feel fewer than 10 movements in 2 hours.  Your baby is not moving like he or she usually does. Date: ____________ Start time: ____________ Stop time: ____________ Movements: ____________ Date: ____________ Start time: ____________ Stop time: ____________ Movements: ____________ Date: ____________  Start time: ____________ Stop time: ____________ Movements: ____________ Date: ____________ Start time: ____________ Stop time: ____________ Movements: ____________ Date: ____________ Start time: ____________ Stop time: ____________ Movements: ____________ Date: ____________ Start time: ____________ Stop time: ____________ Movements: ____________ Date: ____________ Start time: ____________ Stop time: ____________ Movements: ____________ Date: ____________ Start time: ____________ Stop time: ____________ Movements: ____________ Date: ____________ Start time: ____________ Stop time: ____________ Movements: ____________ This information is not intended to replace advice given to you by your health care provider. Make sure you discuss any questions you have with your health care provider. Document Revised: 07/28/2019 Document Reviewed: 07/28/2019 Elsevier Patient Education  2021 Elsevier Inc.  

## 2021-05-09 ENCOUNTER — Ambulatory Visit (INDEPENDENT_AMBULATORY_CARE_PROVIDER_SITE_OTHER): Payer: Medicaid Other | Admitting: Obstetrics and Gynecology

## 2021-05-09 ENCOUNTER — Other Ambulatory Visit: Payer: Self-pay

## 2021-05-09 ENCOUNTER — Encounter: Payer: Self-pay | Admitting: Obstetrics and Gynecology

## 2021-05-09 VITALS — BP 105/73 | HR 99 | Ht 59.0 in | Wt 151.5 lb

## 2021-05-09 DIAGNOSIS — Z3A34 34 weeks gestation of pregnancy: Secondary | ICD-10-CM

## 2021-05-09 DIAGNOSIS — O09893 Supervision of other high risk pregnancies, third trimester: Secondary | ICD-10-CM

## 2021-05-09 LAB — POCT URINALYSIS DIPSTICK OB
Bilirubin, UA: NEGATIVE
Blood, UA: NEGATIVE
Glucose, UA: NEGATIVE
Ketones, UA: NEGATIVE
Leukocytes, UA: NEGATIVE
Nitrite, UA: NEGATIVE
POC,PROTEIN,UA: NEGATIVE
Spec Grav, UA: 1.01 (ref 1.010–1.025)
Urobilinogen, UA: 0.2 E.U./dL
pH, UA: 7.5 (ref 5.0–8.0)

## 2021-05-09 NOTE — Progress Notes (Signed)
ROB: We have discussed her upcoming cesarean delivery.  She has reaffirmed her desire for permanent sterilization at that time.  Necessity of 37-week delivery (history of uterine rupture) discussed.  COVID testing prior to surgery discussed.  May 31, 2021 chosen in conjunction with labor and delivery for her cesarean date.

## 2021-05-23 ENCOUNTER — Ambulatory Visit (INDEPENDENT_AMBULATORY_CARE_PROVIDER_SITE_OTHER): Payer: Medicaid Other | Admitting: Certified Nurse Midwife

## 2021-05-23 ENCOUNTER — Encounter: Payer: Self-pay | Admitting: Certified Nurse Midwife

## 2021-05-23 ENCOUNTER — Other Ambulatory Visit: Payer: Self-pay

## 2021-05-23 VITALS — BP 101/70 | HR 112 | Wt 155.2 lb

## 2021-05-23 DIAGNOSIS — Z3403 Encounter for supervision of normal first pregnancy, third trimester: Secondary | ICD-10-CM

## 2021-05-23 DIAGNOSIS — O0993 Supervision of high risk pregnancy, unspecified, third trimester: Secondary | ICD-10-CM

## 2021-05-23 DIAGNOSIS — Z113 Encounter for screening for infections with a predominantly sexual mode of transmission: Secondary | ICD-10-CM

## 2021-05-23 DIAGNOSIS — Z3685 Encounter for antenatal screening for Streptococcus B: Secondary | ICD-10-CM

## 2021-05-23 DIAGNOSIS — Z3A36 36 weeks gestation of pregnancy: Secondary | ICD-10-CM

## 2021-05-23 LAB — POCT URINALYSIS DIPSTICK OB
Bilirubin, UA: NEGATIVE
Blood, UA: NEGATIVE
Glucose, UA: NEGATIVE
Ketones, UA: NEGATIVE
Leukocytes, UA: NEGATIVE
Nitrite, UA: NEGATIVE
POC,PROTEIN,UA: NEGATIVE
Spec Grav, UA: 1.015 (ref 1.010–1.025)
Urobilinogen, UA: 0.2 E.U./dL
pH, UA: 6.5 (ref 5.0–8.0)

## 2021-05-23 NOTE — Progress Notes (Signed)
ROB: Doing well. No concerns today. Cultures done today.

## 2021-05-23 NOTE — Progress Notes (Signed)
ROB-Doing well. Questions safety of pool during pregnancy, planning weekend activity with her boys. Pregnancy precautions discussed in detail. Asked to assist with scheduled c-section on 05/31/2021 at 0730, agreed. 36 week cultures collected, see orders. Reviewed red flag symptoms and when to call. Report to Labor and Delivery at 0500 on scheduled date or sooner if needed.

## 2021-05-23 NOTE — Patient Instructions (Signed)
Cesarean Delivery Cesarean birth, or cesarean delivery, is the surgical delivery of a baby through an incision in the abdomen and the uterus. This may be referred to as a C-section. This procedure may be scheduled ahead of time, or it may be done in an emergency situation. Tell a health care provider about:  Any allergies you have.  All medicines you are taking, including vitamins, herbs, eye drops, creams, and over-the-counter medicines.  Any problems you or family members have had with anesthetic medicines.  Any blood disorders you have.  Any surgeries you have had.  Any medical conditions you have.  Whether you or any members of your family have a history of deep vein thrombosis (DVT) or pulmonary embolism (PE). What are the risks? Generally, this is a safe procedure. However, problems may occur, including:  Infection.  Bleeding.  Allergic reactions to medicines.  Damage to other structures or organs.  Blood clots.  Injury to your baby. What happens before the procedure? General instructions  Follow instructions from your health care provider about eating or drinking restrictions.  If you know that you are going to have a cesarean delivery, do not shave your pubic area. Shaving before the procedure may increase your risk of infection.  Plan to have someone take you home from the hospital.  Ask your health care provider what steps will be taken to prevent infection. These may include: ? Removing hair at the surgery site. ? Washing skin with a germ-killing soap. ? Taking antibiotic medicine.  Depending on the reason for your cesarean delivery, you may have a physical exam or additional testing, such as an ultrasound.  You may have your blood or urine tested. Questions for your health care provider  Ask your health care provider about: ? Changing or stopping your regular medicines. This is especially important if you are taking diabetes medicines or blood  thinners. ? Your pain management plan. This is especially important if you plan to breastfeed your baby. ? How long you will be in the hospital after the procedure. ? Any concerns you may have about receiving blood products, if you need them during the procedure. ? Cord blood banking, if you plan to collect your baby's umbilical cord blood.  You may also want to ask your health care provider: ? Whether you will be able to hold or breastfeed your baby while you are still in the operating room. ? Whether your baby can stay with you immediately after the procedure and during your recovery. ? Whether a family member or a person of your choice can go with you into the operating room and stay with you during the procedure, immediately after the procedure, and during your recovery. What happens during the procedure?  An IV will be inserted into one of your veins.  Fluid and medicines, such as antibiotics, will be given before the surgery.  Fetal monitors will be placed on your abdomen to check your baby's heart rate.  You may be given a special warming gown to wear to keep your temperature stable.  A catheter may be inserted into your bladder through your urethra. This drains your urine during the procedure.  You may be given one or more of the following: ? A medicine to numb the area (local anesthetic). ? A medicine to make you fall asleep (general anesthetic). ? A medicine (regional anesthetic) that is injected into your back or through a small thin tube placed in your back (spinal anesthetic or epidural anesthetic). This   numbs everything below the injection site and allows you to stay awake during your procedure. If this makes you feel nauseous, tell your health care provider. Medicines will be available to help reduce any nausea you may feel.  An incision will be made in your abdomen, and then in your uterus.  If you are awake during your procedure, you may feel tugging and pulling in your  abdomen, but you should not feel pain. If you feel pain, tell your health care provider immediately.  Your baby will be removed from your uterus. You may feel more pressure or pushing while this happens.  Immediately after birth, your baby will be dried and kept warm. You may be able to hold and breastfeed your baby.  The umbilical cord may be clamped and cut during this time. This usually occurs after waiting a period of 1-2 minutes after delivery.  Your placenta will be removed from your uterus.  Your incisions will be closed with stitches (sutures). Staples, skin glue, or adhesive strips may also be applied to the incision in your abdomen.  Bandages (dressings) may be placed over the incision in your abdomen. The procedure may vary among health care providers and hospitals.   What happens after the procedure?  Your blood pressure, heart rate, breathing rate, and blood oxygen level will be monitored until you are discharged from the hospital.  You may continue to receive fluids and medicines through an IV.  You will have some pain. Medicines will be available to help control your pain.  To help prevent blood clots: ? You may be given medicines. ? You may have to wear compression stockings or devices. ? You will be encouraged to walk around when you are able.  Hospital staff will encourage and support bonding with your baby. Your hospital may have you and your baby to stay in the same room (rooming in) during your hospital stay to encourage successful bonding and breastfeeding.  You may be encouraged to cough and breathe deeply often. This helps to prevent lung problems.  If you have a catheter draining your urine, it will be removed as soon as possible after your procedure. Summary  Cesarean birth, or cesarean delivery, is the surgical delivery of a baby through an incision in the abdomen and the uterus.  Follow instructions from your health care provider about eating or  drinking restrictions before the procedure.  You will have some pain after the procedure. Medicines will be available to help control your pain.  Hospital staff will encourage and support bonding with your baby after the procedure. Your hospital may have you and your baby to stay in the same room (rooming in) during your hospital stay to encourage successful bonding and breastfeeding. This information is not intended to replace advice given to you by your health care provider. Make sure you discuss any questions you have with your health care provider. Document Revised: 08/06/2020 Document Reviewed: 06/14/2018 Elsevier Patient Education  2021 Elsevier Inc.   

## 2021-05-24 ENCOUNTER — Encounter
Admission: RE | Admit: 2021-05-24 | Discharge: 2021-05-24 | Disposition: A | Discharge status: Home / Self Care | Payer: Medicaid Other | Source: Ambulatory Visit | Attending: Obstetrics and Gynecology | Admitting: Obstetrics and Gynecology

## 2021-05-24 ENCOUNTER — Other Ambulatory Visit: Payer: Self-pay

## 2021-05-24 DIAGNOSIS — Z20822 Contact with and (suspected) exposure to covid-19: Secondary | ICD-10-CM | POA: Diagnosis not present

## 2021-05-24 DIAGNOSIS — Z01812 Encounter for preprocedural laboratory examination: Secondary | ICD-10-CM | POA: Diagnosis present

## 2021-05-24 NOTE — Patient Instructions (Signed)
INSTRUCTIONS FOR SURGERY     Your C-SECTION is scheduled for Friday, June 10TH      PLEASE ARRIVE AT 5:00 AM TO THE EMERGENCY ROOM.  YOU WILL THEN    BE ESCORTED TO LABOR AND DELIVERY.       REMEMBER: Instructions that are not followed completely may result in serious medical risk,  up to and including death, or upon the discretion of your surgeon and anesthesiologist,            your surgery may need to be rescheduled.  __X__ 1. Do not eat food after midnight the night before your procedure.                    No gum, candy, lozenger, tic tacs, tums or hard candies.                  ABSOLUTELY NOTHING SOLID IN YOUR MOUTH AFTER MIDNIGHT                    You may drink unlimited clear liquids up to 2 hours before you are scheduled to arrive for surgery.                   Do not drink anything within those 2 hours unless you need to take medicine, then take the                   smallest amount you need.  Clear liquids include:  water, apple juice without pulp,                   any flavor Gatorade, Black coffee, black tea.  Sugar may be added but no dairy/ honey /lemon.                        Broth and jello is not considered a clear liquid.  __x__  2. On the morning of surgery, please brush your teeth with toothpaste and water. You may rinse with                  mouthwash if you wish but DO NOT SWALLOW TOOTHPASTE OR MOUTHWASH  __X___3. NO alcohol for 24 hours before or after surgery.  __x___ 4.  Do NOT smoke or use e-cigarettes for 24 HOURS PRIOR TO SURGERY.                      DO NOT Use any chewable tobacco products for at least 6 hours prior to surgery.  __x___ 5. If you start any new medication after this appointment and prior to surgery, please                   Bring it with you on the day of surgery.  ___x__ 6. Notify your doctor if there is any change in your medical condition, such as fever,                   infection, vomitting, diarrhea or any open sores.  __x___ 7.  USE the CHG SOAP as instructed, the night before surgery  and the day of surgery.                   Once you have washed with this soap, do NOT use any of the following: Powders, perfumes                    or lotions. Please do not wear make up, hairpins, clips or nail polish. You MAY wear deodorant.                                                 Women need to shave 48 hours prior to surgery.                   DO NOT wear ANY jewelry on the day of surgery. If there are rings that are too tight to                    remove easily, please address this prior to the surgery day. Piercings need to be removed.                                                                     NO METAL ON YOUR BODY.                    Do NOT bring any valuables.  If you came to Pre-Admit testing then you will not need license,                     insurance card or credit card.  If you will be staying overnight, please either leave your things in                     the car or have your family be responsible for these items.                     [facility] IS NOT RESPONSIBLE FOR BELONGINGS OR VALUABLES.  ___X__ 8. DO NOT wear contact lenses on surgery day.  You may not have dentures,                     Hearing aides, contacts or glasses in the operating room. These items can be                    Placed in the Recovery Room to receive immediately after surgery.  __x___ 9. IF YOU ARE SCHEDULED TO GO HOME ON THE SAME DAY, YOU MUST                   Have someone to drive you home and to stay with you  for the first 24 hours.                    Have an arrangement prior to arriving on surgery day.  ___x__ 10. Take the following medications on the morning of surgery with a sip of water:  1. NOTHING                     2   __X__  12. STOP ALL ASPIRIN PRODUCTS AS OF TODAY, June 3RD                       THIS INCLUDES BC POWDERS  / GOODIES POWDER  __x___ 13. STOP Anti-inflammatories as of TODAY, June 3RD                      This includes IBUPROFEN / MOTRIN / ADVIL / ALEVE/ NAPROXYN                    YOU MAY TAKE TYLENOL ANY TIME PRIOR TO SURGERY.  __X___ 14.  Stop supplements until after surgery.                     This includes: FERROUS SULFATE // MULTIVITAMIN GUMMIES                  __X____18. If staying overnight, please have appropriate shoes to wear to be able to walk around the unit.                   Wear clean and comfortable clothing to the hospital.  REMEMBER TO [facility] YOUR PHONE AND CHARGER. HAVE PHONE NUMBERS FOR YOUR CONTACTS.

## 2021-05-27 LAB — GC/CHLAMYDIA PROBE AMP
Chlamydia trachomatis, NAA: NEGATIVE
Neisseria Gonorrhoeae by PCR: NEGATIVE

## 2021-05-27 LAB — STREP GP B NAA: Strep Gp B NAA: NEGATIVE

## 2021-05-29 ENCOUNTER — Other Ambulatory Visit
Admission: RE | Admit: 2021-05-29 | Discharge: 2021-05-29 | Disposition: A | Discharge status: Home / Self Care | Payer: Medicaid Other | Source: Ambulatory Visit | Attending: Obstetrics and Gynecology | Admitting: Obstetrics and Gynecology

## 2021-05-29 ENCOUNTER — Other Ambulatory Visit: Payer: Self-pay

## 2021-05-29 DIAGNOSIS — Z01812 Encounter for preprocedural laboratory examination: Secondary | ICD-10-CM | POA: Diagnosis not present

## 2021-05-29 DIAGNOSIS — Z20822 Contact with and (suspected) exposure to covid-19: Secondary | ICD-10-CM | POA: Insufficient documentation

## 2021-05-29 LAB — TYPE AND SCREEN
ABO/RH(D): O POS
Antibody Screen: NEGATIVE
Extend sample reason: UNDETERMINED

## 2021-05-29 LAB — CBC
HCT: 31.2 % — ABNORMAL LOW (ref 36.0–46.0)
Hemoglobin: 10.5 g/dL — ABNORMAL LOW (ref 12.0–15.0)
MCH: 30.3 pg (ref 26.0–34.0)
MCHC: 33.7 g/dL (ref 30.0–36.0)
MCV: 89.9 fL (ref 80.0–100.0)
Platelets: 171 10*3/uL (ref 150–400)
RBC: 3.47 MIL/uL — ABNORMAL LOW (ref 3.87–5.11)
RDW: 15.3 % (ref 11.5–15.5)
WBC: 11.5 10*3/uL — ABNORMAL HIGH (ref 4.0–10.5)
nRBC: 0 % (ref 0.0–0.2)

## 2021-05-29 LAB — SARS CORONAVIRUS 2 (TAT 6-24 HRS): SARS Coronavirus 2: NEGATIVE

## 2021-05-30 MED ORDER — CHLORHEXIDINE GLUCONATE 0.12 % MT SOLN
15.0000 mL | Freq: Once | OROMUCOSAL | Status: DC
  Filled 2021-05-30: qty 15

## 2021-05-30 MED ORDER — ORAL CARE MOUTH RINSE: 15.0000 mL | Freq: Once | OROMUCOSAL | Status: DC

## 2021-05-30 MED ORDER — CEFAZOLIN SODIUM-DEXTROSE 2-4 GM/100ML-% IV SOLN: 2.0000 g | INTRAVENOUS | Status: DC

## 2021-05-30 MED ORDER — LACTATED RINGERS IV SOLN: INTRAVENOUS | Status: DC

## 2021-05-30 MED ORDER — POVIDONE-IODINE 10 % EX SWAB
2.0000 | Freq: Once | CUTANEOUS | Status: DC
Start: 2021-05-30 — End: 2021-05-31

## 2021-05-31 ENCOUNTER — Inpatient Hospital Stay
Admission: RE | Admit: 2021-05-31 | Payer: Medicaid Other | Source: Home / Self Care | Admitting: Obstetrics and Gynecology

## 2021-05-31 ENCOUNTER — Inpatient Hospital Stay: Payer: Medicaid Other | Admitting: Anesthesiology

## 2021-05-31 ENCOUNTER — Other Ambulatory Visit: Payer: Self-pay

## 2021-05-31 ENCOUNTER — Inpatient Hospital Stay: Payer: Medicaid Other | Admitting: Urgent Care

## 2021-05-31 ENCOUNTER — Inpatient Hospital Stay
Admission: EM | Admit: 2021-05-31 | Discharge: 2021-06-02 | DRG: 785 | Disposition: A | Discharge status: Home / Self Care | Payer: Medicaid Other | Attending: Obstetrics and Gynecology | Admitting: Obstetrics and Gynecology

## 2021-05-31 ENCOUNTER — Encounter: Payer: Self-pay | Admitting: Obstetrics and Gynecology

## 2021-05-31 ENCOUNTER — Encounter
Admission: EM | Disposition: A | Discharge status: Home / Self Care | Payer: Self-pay | Source: Home / Self Care | Attending: Obstetrics and Gynecology

## 2021-05-31 DIAGNOSIS — Z3009 Encounter for other general counseling and advice on contraception: Secondary | ICD-10-CM

## 2021-05-31 DIAGNOSIS — Z302 Encounter for sterilization: Secondary | ICD-10-CM | POA: Diagnosis not present

## 2021-05-31 DIAGNOSIS — T8140XA Infection following a procedure, unspecified, initial encounter: Secondary | ICD-10-CM | POA: Diagnosis present

## 2021-05-31 DIAGNOSIS — Z3A37 37 weeks gestation of pregnancy: Secondary | ICD-10-CM | POA: Diagnosis not present

## 2021-05-31 DIAGNOSIS — O34211 Maternal care for low transverse scar from previous cesarean delivery: Principal | ICD-10-CM | POA: Diagnosis present

## 2021-05-31 DIAGNOSIS — O8600 Infection of obstetric surgical wound, unspecified: Secondary | ICD-10-CM | POA: Diagnosis not present

## 2021-05-31 SURGERY — Surgical Case
Anesthesia: Spinal

## 2021-05-31 MED ORDER — MENTHOL 3 MG MT LOZG
1.0000 | LOZENGE | OROMUCOSAL | Status: DC | PRN
  Filled 2021-05-31: qty 9

## 2021-05-31 MED ORDER — LACTATED RINGERS IV SOLN: INTRAVENOUS | Status: DC

## 2021-05-31 MED ORDER — KETOROLAC TROMETHAMINE 30 MG/ML IJ SOLN
30.0000 mg | Freq: Four times a day (QID) | INTRAMUSCULAR | Status: AC | PRN
  Administered 2021-06-01: 30 mg via INTRAVENOUS
  Filled 2021-05-31: qty 1

## 2021-05-31 MED ORDER — SIMETHICONE 80 MG PO CHEW
80.0000 mg | CHEWABLE_TABLET | Freq: Four times a day (QID) | ORAL | Status: DC
  Administered 2021-05-31 – 2021-06-02 (×8): 80 mg via ORAL
  Filled 2021-05-31 (×9): qty 1

## 2021-05-31 MED ORDER — FENTANYL CITRATE (PF) 100 MCG/2ML IJ SOLN
25.0000 ug | INTRAMUSCULAR | Status: DC | PRN
  Administered 2021-05-31 (×2): 25 ug via INTRAVENOUS
  Filled 2021-05-31: qty 2

## 2021-05-31 MED ORDER — NALOXONE HCL 0.4 MG/ML IJ SOLN: 0.4000 mg | INTRAMUSCULAR | Status: DC | PRN

## 2021-05-31 MED ORDER — SODIUM CHLORIDE 0.9% FLUSH: 3.0000 mL | INTRAVENOUS | Status: DC | PRN

## 2021-05-31 MED ORDER — PHENYLEPHRINE HCL (PRESSORS) 10 MG/ML IV SOLN
INTRAVENOUS | Status: DC | PRN
  Administered 2021-05-31: 100 ug via INTRAVENOUS

## 2021-05-31 MED ORDER — SODIUM CHLORIDE 0.9 % IV SOLN
INTRAVENOUS | Status: DC | PRN
  Administered 2021-05-31: 50 ug/min via INTRAVENOUS

## 2021-05-31 MED ORDER — FENTANYL CITRATE (PF) 100 MCG/2ML IJ SOLN
INTRAMUSCULAR | Status: DC | PRN
  Administered 2021-05-31: 15 ug via INTRATHECAL

## 2021-05-31 MED ORDER — ZOLPIDEM TARTRATE 5 MG PO TABS: 5.0000 mg | ORAL_TABLET | Freq: Every evening | ORAL | Status: DC | PRN

## 2021-05-31 MED ORDER — MORPHINE SULFATE (PF) 0.5 MG/ML IJ SOLN
INTRAMUSCULAR | Status: AC
  Filled 2021-05-31: qty 10

## 2021-05-31 MED ORDER — DIPHENHYDRAMINE HCL 25 MG PO CAPS: 25.0000 mg | ORAL_CAPSULE | ORAL | Status: DC | PRN

## 2021-05-31 MED ORDER — OXYTOCIN-SODIUM CHLORIDE 30-0.9 UT/500ML-% IV SOLN
2.5000 [IU]/h | INTRAVENOUS | Status: AC
  Administered 2021-05-31 (×2): 2.5 [IU]/h via INTRAVENOUS

## 2021-05-31 MED ORDER — ONDANSETRON HCL 4 MG/2ML IJ SOLN
INTRAMUSCULAR | Status: AC
  Filled 2021-05-31: qty 2

## 2021-05-31 MED ORDER — NALBUPHINE HCL 10 MG/ML IJ SOLN: 5.0000 mg | INTRAMUSCULAR | Status: DC | PRN

## 2021-05-31 MED ORDER — KETOROLAC TROMETHAMINE 30 MG/ML IJ SOLN
INTRAMUSCULAR | Status: DC | PRN
  Administered 2021-05-31: 30 mg via INTRAVENOUS

## 2021-05-31 MED ORDER — DIPHENHYDRAMINE HCL 25 MG PO CAPS: 25.0000 mg | ORAL_CAPSULE | Freq: Four times a day (QID) | ORAL | Status: DC | PRN

## 2021-05-31 MED ORDER — ACETAMINOPHEN 500 MG PO TABS
1000.0000 mg | ORAL_TABLET | Freq: Four times a day (QID) | ORAL | Status: AC
  Administered 2021-05-31 – 2021-06-01 (×4): 1000 mg via ORAL
  Filled 2021-05-31 (×4): qty 2

## 2021-05-31 MED ORDER — FENTANYL CITRATE (PF) 100 MCG/2ML IJ SOLN
INTRAMUSCULAR | Status: AC
  Filled 2021-05-31: qty 2

## 2021-05-31 MED ORDER — SENNOSIDES-DOCUSATE SODIUM 8.6-50 MG PO TABS
2.0000 | ORAL_TABLET | ORAL | Status: DC
  Administered 2021-05-31 – 2021-06-01 (×2): 2 via ORAL
  Filled 2021-05-31 (×2): qty 2

## 2021-05-31 MED ORDER — SOD CITRATE-CITRIC ACID 500-334 MG/5ML PO SOLN
ORAL | Status: AC
  Filled 2021-05-31: qty 15

## 2021-05-31 MED ORDER — BUPIVACAINE IN DEXTROSE 0.75-8.25 % IT SOLN
INTRATHECAL | Status: DC | PRN
  Administered 2021-05-31: 1.5 mL via INTRATHECAL

## 2021-05-31 MED ORDER — LIDOCAINE 5 % EX PTCH
MEDICATED_PATCH | CUTANEOUS | Status: AC
  Filled 2021-05-31: qty 1

## 2021-05-31 MED ORDER — DEXAMETHASONE SODIUM PHOSPHATE 10 MG/ML IJ SOLN
INTRAMUSCULAR | Status: AC
  Filled 2021-05-31: qty 1

## 2021-05-31 MED ORDER — KETOROLAC TROMETHAMINE 30 MG/ML IJ SOLN: 30.0000 mg | Freq: Four times a day (QID) | INTRAMUSCULAR | Status: AC | PRN

## 2021-05-31 MED ORDER — CEFAZOLIN SODIUM-DEXTROSE 2-4 GM/100ML-% IV SOLN
INTRAVENOUS | Status: AC
  Filled 2021-05-31: qty 100

## 2021-05-31 MED ORDER — MORPHINE SULFATE (PF) 0.5 MG/ML IJ SOLN
INTRAMUSCULAR | Status: DC | PRN
  Administered 2021-05-31: .1 mg via INTRATHECAL

## 2021-05-31 MED ORDER — ONDANSETRON HCL 4 MG/2ML IJ SOLN: 4.0000 mg | Freq: Once | INTRAMUSCULAR | Status: DC | PRN

## 2021-05-31 MED ORDER — MEPERIDINE HCL 25 MG/ML IJ SOLN: 6.2500 mg | INTRAMUSCULAR | Status: DC | PRN

## 2021-05-31 MED ORDER — SOD CITRATE-CITRIC ACID 500-334 MG/5ML PO SOLN
ORAL | Status: AC
  Administered 2021-05-31: 30 mL
  Filled 2021-05-31: qty 15

## 2021-05-31 MED ORDER — NALBUPHINE HCL 10 MG/ML IJ SOLN: 5.0000 mg | Freq: Once | INTRAMUSCULAR | Status: DC | PRN

## 2021-05-31 MED ORDER — DIPHENHYDRAMINE HCL 50 MG/ML IJ SOLN: 12.5000 mg | INTRAMUSCULAR | Status: DC | PRN

## 2021-05-31 MED ORDER — LIDOCAINE HCL (PF) 1 % IJ SOLN
INTRAMUSCULAR | Status: DC | PRN
  Administered 2021-05-31: 3 mL via SUBCUTANEOUS

## 2021-05-31 MED ORDER — CEFAZOLIN SODIUM-DEXTROSE 2-3 GM-%(50ML) IV SOLR
INTRAVENOUS | Status: DC | PRN
  Administered 2021-05-31: 2 g via INTRAVENOUS

## 2021-05-31 MED ORDER — OXYTOCIN-SODIUM CHLORIDE 30-0.9 UT/500ML-% IV SOLN
INTRAVENOUS | Status: DC | PRN
  Administered 2021-05-31: 30 [IU] via INTRAVENOUS

## 2021-05-31 MED ORDER — NALOXONE HCL 4 MG/10ML IJ SOLN
1.0000 ug/kg/h | INTRAVENOUS | Status: DC | PRN
  Filled 2021-05-31: qty 5

## 2021-05-31 MED ORDER — ONDANSETRON HCL 4 MG/2ML IJ SOLN
INTRAMUSCULAR | Status: DC | PRN
  Administered 2021-05-31: 4 mg via INTRAVENOUS

## 2021-05-31 MED ORDER — DEXAMETHASONE SODIUM PHOSPHATE 10 MG/ML IJ SOLN
INTRAMUSCULAR | Status: DC | PRN
  Administered 2021-05-31: 10 mg via INTRAVENOUS

## 2021-05-31 MED ORDER — EPHEDRINE SULFATE 50 MG/ML IJ SOLN
INTRAMUSCULAR | Status: DC | PRN
  Administered 2021-05-31: 5 mg via INTRAVENOUS

## 2021-05-31 MED ORDER — PRENATAL MULTIVITAMIN CH
1.0000 | ORAL_TABLET | Freq: Every day | ORAL | Status: DC
  Administered 2021-05-31 – 2021-06-01 (×2): 1 via ORAL
  Filled 2021-05-31 (×2): qty 1

## 2021-05-31 MED ORDER — KETOROLAC TROMETHAMINE 30 MG/ML IJ SOLN
INTRAMUSCULAR | Status: AC
  Filled 2021-05-31: qty 1

## 2021-05-31 MED ORDER — OXYCODONE-ACETAMINOPHEN 5-325 MG PO TABS
1.0000 | ORAL_TABLET | ORAL | Status: DC | PRN
  Filled 2021-05-31: qty 2

## 2021-05-31 MED ORDER — OXYTOCIN-SODIUM CHLORIDE 30-0.9 UT/500ML-% IV SOLN
INTRAVENOUS | Status: AC
  Filled 2021-05-31: qty 1000

## 2021-05-31 SURGICAL SUPPLY — 26 items
ADHESIVE MASTISOL STRL (MISCELLANEOUS) ×3 IMPLANT
BAG COUNTER SPONGE EZ (MISCELLANEOUS) ×2 IMPLANT
CHLORAPREP W/TINT 26 (MISCELLANEOUS) ×6 IMPLANT
COUNTER SPONGE BAG EZ (MISCELLANEOUS) ×1
COVER WAND RF STERILE (DRAPES) ×3 IMPLANT
DRSG TELFA 3X8 NADH (GAUZE/BANDAGES/DRESSINGS) ×3 IMPLANT
GAUZE SPONGE 4X4 12PLY STRL (GAUZE/BANDAGES/DRESSINGS) ×3 IMPLANT
GLOVE SURG POLY ORTHO LF SZ7.5 (GLOVE) ×3 IMPLANT
GOWN STRL REUS W/ TWL LRG LVL3 (GOWN DISPOSABLE) ×2 IMPLANT
GOWN STRL REUS W/TWL LRG LVL3 (GOWN DISPOSABLE) ×4
KIT TURNOVER KIT A (KITS) ×3 IMPLANT
MANIFOLD NEPTUNE II (INSTRUMENTS) ×3 IMPLANT
MAT PREVALON FULL STRYKER (MISCELLANEOUS) ×3 IMPLANT
NS IRRIG 1000ML POUR BTL (IV SOLUTION) ×3 IMPLANT
PACK C SECTION AR (MISCELLANEOUS) ×3 IMPLANT
PAD OB MATERNITY 4.3X12.25 (PERSONAL CARE ITEMS) ×3 IMPLANT
PAD PREP 24X41 OB/GYN DISP (PERSONAL CARE ITEMS) ×3 IMPLANT
PENCIL SMOKE EVACUATOR (MISCELLANEOUS) ×3 IMPLANT
RETRACTOR WND ALEXIS-O 25 LRG (MISCELLANEOUS) ×1 IMPLANT
RTRCTR WOUND ALEXIS O 25CM LRG (MISCELLANEOUS) ×3
SPONGE LAP 18X18 RF (DISPOSABLE) ×3 IMPLANT
SUT SILK 2 0 REEL (SUTURE) ×3 IMPLANT
SUT VIC AB 0 CTX 36 (SUTURE) ×4
SUT VIC AB 0 CTX36XBRD ANBCTRL (SUTURE) ×2 IMPLANT
SUT VIC AB 1 CT1 36 (SUTURE) ×6 IMPLANT
SUT VICRYL+ 3-0 36IN CT-1 (SUTURE) ×6 IMPLANT

## 2021-05-31 NOTE — H&P (Signed)
History and Physical   HPI  Tammy Ward is a 25 y.o. [id number] at [redacted]w[redacted]d Estimated Date of Delivery: 06/18/21 who is being admitted for C-section repeat. Remote history of ruptured uterus.   OB History  OB History  Gravida Para Term Preterm AB Living  3 2 2  0 0 2  SAB IAB Ectopic Multiple Live Births  0 0 0 0 2    # Outcome Date GA Lbr Len/2nd Weight Sex Delivery Anes PTL Lv  3 Current           2 Term 08/05/19 [redacted]w[redacted]d  3390 g M CS-LTranv Spinal  LIV     Name: [redacted]w[redacted]d     Apgar1: 9  Apgar5: 10  1 Term 11/18/17 [redacted]w[redacted]d  3340 g [redacted]w[redacted]d CS-LTranv Gen  LIV     Name: Tammy Ward     Apgar1: 7  Apgar5: 9    Obstetric Comments  Menstrual age: 7    Age 1st Pregnancy:NA    PROBLEM LIST  Pregnancy complications or risks: Patient Active Problem List   Diagnosis Date Noted   Dyspareunia, female 02/18/2020   History of rupture of uterus 01/06/2019   History of low transverse cesarean section 01/06/2019    Prenatal labs and studies: ABO, Rh: --/--/O POS (06/08 1038) Antibody: NEG (06/08 1038) Rubella: 2.19 (11/17 1510) RPR: Non Reactive (04/07 1502)  HBsAg: Negative (11/17 1510)  HIV: Non Reactive (11/17 1510)  09-11-1982-- (06/02 1629)   Past Medical History:  Diagnosis Date   Anemia    iron deficiency   Depression    History of kidney stones    Left genital labial abscess    Ruptured uterus during labor, delivered      Past Surgical History:  Procedure Laterality Date   ABSCESS DRAINAGE  02-02-15   Labial    CESAREAN SECTION N/A 11/18/2017   Procedure: CESAREAN SECTION;  Surgeon: 11/20/2017, MD;  Location: [facility] ORS;  Service: Obstetrics;  Laterality: N/A;   CESAREAN SECTION N/A 08/05/2019   Procedure: REPEAT CESAREAN SECTION;  Surgeon: 08/07/2019, MD;  Location: [facility] ORS;  Service: Obstetrics;  Laterality: N/A;   KIDNEY STONE SURGERY  2014   WISDOM TOOTH EXTRACTION       Medications    Current Discharge Medication List      CONTINUE these medications which have NOT CHANGED   Details  ferrous sulfate 325 (65 FE) MG tablet Take 325 mg by mouth daily with breakfast.    Prenatal MV & Min w/FA-DHA (PRENATAL ADULT GUMMY/DHA/FA PO) Take 2 each by mouth daily.         Allergies  Patient has no known allergies.  Review of Systems  Pertinent items noted in HPI and remainder of comprehensive ROS otherwise negative.  Physical Exam  BP 124/76   Pulse 85   Temp 98.7 F (37.1 C) (Oral)   Resp 18   Ht 4\' 11"  (1.499 m)   Wt 70.3 kg   LMP 09/05/2020 (Exact Date)   BMI 31.31 kg/m   Lungs:  CTA B Cardio: RRR without M/R/G Abd: Soft, gravid, NT Presentation: cephalic EXT: No C/C/ 1+ Edema DTRs: 2+ B CERVIX:    See Prenatal records for more detailed PE.     FHR:  Variability: Good {> 6 bpm)  Toco: Uterine Contractions: None  Test Results  No results found for this or any previous visit (from the past 24 hour(s)).   Assessment   G3P2002 at [redacted]w[redacted]d Estimated Date  of Delivery: 06/18/21  The fetus is reassuring.   Patient Active Problem List   Diagnosis Date Noted   Dyspareunia, female 02/18/2020   History of rupture of uterus 01/06/2019   History of low transverse cesarean section 01/06/2019    Plan  1. Admit to L&D :   2. EFM: -- Category 1 3. Cesarean delivery 4. Pt has re-affirmed her desire for permanent sterilization   [name], M.D. 05/31/2021 7:25 AM

## 2021-05-31 NOTE — Anesthesia Preprocedure Evaluation (Addendum)
Anesthesia Evaluation  Patient identified by MRN, date of birth, ID band Patient awake    Reviewed: Allergy & Precautions, NPO status , Patient's Chart, lab work & pertinent test results  History of Anesthesia Complications Negative for: history of anesthetic complications  Airway Mallampati: III       Dental   Pulmonary neg sleep apnea, neg COPD, Not current smoker,           Cardiovascular (-) hypertension(-) Past MI and (-) CHF (-) dysrhythmias (-) Valvular Problems/Murmurs     Neuro/Psych neg Seizures Depression    GI/Hepatic Neg liver ROS, GERD (with pregnancy)  ,  Endo/Other  neg diabetes  Renal/GU negative Renal ROS     Musculoskeletal   Abdominal   Peds  Hematology  (+) anemia ,   Anesthesia Other Findings   Reproductive/Obstetrics                            Anesthesia Physical Anesthesia Plan  ASA: 2  Anesthesia Plan: Spinal   Post-op Pain Management:    Induction:   PONV Risk Score and Plan:   Airway Management Planned:   Additional Equipment:   Intra-op Plan:   Post-operative Plan:   Informed Consent: I have reviewed the patients History and Physical, chart, labs and discussed the procedure including the risks, benefits and alternatives for the proposed anesthesia with the patient or authorized representative who has indicated his/her understanding and acceptance.       Plan Discussed with:   Anesthesia Plan Comments:         Anesthesia Quick Evaluation

## 2021-05-31 NOTE — Transfer of Care (Signed)
Immediate Anesthesia Transfer of Care Note  Patient: Tammy Ward  Procedure(s) Performed: CESAREAN SECTION WITH BILATERAL TUBAL LIGATION  Patient Location: PACU  Anesthesia Type:Spinal  Level of Consciousness: awake, alert  and oriented  Airway & Oxygen Therapy: Patient Spontanous Breathing  Post-op Assessment: Report given to RN and Post -op Vital signs reviewed and stable  Post vital signs: Reviewed and stable  Last Vitals:  Vitals Value Taken Time  BP 113/53 05/31/21 0933  Temp    Pulse 75 05/31/21 0933  Resp 19 05/31/21 0933  SpO2 98 % 05/31/21 0933    Last Pain:  Vitals:   05/31/21 0933  TempSrc:   PainSc: 0-No pain      Patients Stated Pain Goal: 0 (05/31/21 0556)  Complications: No notable events documented.

## 2021-05-31 NOTE — Op Note (Signed)
      OP NOTE  Date: 05/31/2021   10:02 AM Name ELNITA SURPRENANT MR# [id number]  Preoperative Diagnosis: 1. Intrauterine pregnancy at [redacted]w[redacted]d 2. Desires Permanent Sterilization Active Problems:   * No active hospital problems. *  Postoperative Diagnosis: 1. Intrauterine pregnancy at [redacted]w[redacted]d, delivered 2. Desires Permanent Sterilization 3. Viable infant 4. Remainder same as pre-op  Procedure: 1. Repeat Low-Transverse Cesarean Section 2. Bilateral Tubal Occlusion  Surgeon: [name], MD  Assistant:  Jeralyn Bennett, CNM   No other capable assistant was available for this surgery which requires an experienced, high level assistant.  She provided exposure, dissection, suctioning, retraction, and general support and assistance during the procedure.   Anesthesia: Spinal   EBL: 510  ml    Findings: 1) female infant, Apgar scores of 8   at 1 minute and 9   at 5 minutes and a birthweight of 117.46  ounces.    2) Normal uterus, tubes and ovaries.   Procedure:   The patient was prepped and draped in the supine position and placed under spinal anesthesia.  A transverse incision was made across the abdomen in a Pfannenstiel manner. If indicated the old scar was systematically removed with sharp dissection.  We carried the dissection down to the level of the fascia.  The fascia was incised in a curvilinear manner.  The fascia was then elevated from the rectus muscles with blunt and sharp dissection.  The rectus muscles were separated laterally exposing the peritoneum.  The peritoneum was carefully entered with care being taken to avoid bowel and bladder.  A self-retaining retractor was placed.  The visceral peritoneum was incised in a curvilinear fashion across the lower uterine segment creating a bladder flap. A transverse incision was made across the lower uterine segment and extended laterally and superiorly using the bandage scissors.  Artificial rupture membranes was performed and Clear  fluid was noted.  The infant was delivered from the cephalic position.  A nuchal cord was not present. The cord was doubly clamped and cut. Cord blood was obtained if appropriate.  The infant was handed to the pediatric personnel  who then placed the infant under heat lamps where it was cleaned dried and re-suctioned. The placenta was delivered. The hysterotomy incision was then identified on ring forceps.  The uterine cavity was cleaned with a moist lap sponge.  The hysterotomy incision was closed with a running interlocking suture of Vicryl.  Hemostasis was excellent.  Pitocin was run in the IV and the uterus was found to be firm. The fallopian tubes were identified  and followed out to their fine fimbriated ends and then back to the mid-portion of the tube where a modified Pomeroy tubal lilgation was performed triply ligating both tubes.  Hemostasis was noted. The posterior cul-de-sac and gutters were cleaned and inspected.  Hemostasis was noted.  The fascia was then closed with a running suture of #1 Vicryl.  Hemostasis of the subcutaneous tissues was obtained using the Bovie.  The subcutaneous tissues were closed with a running suture of 000 Vicryl.  A subcuticular suture was placed.  Dermabond was applied.  A pressure dressing was placed.  The patient went to the recovery room in stable condition.   [name], M.D. 05/31/2021 10:02 AM

## 2021-05-31 NOTE — Lactation Note (Addendum)
This note was copied from a baby's chart. Lactation Consultation Note  Patient Name: Girl Debrah Granderson POEUM'P Date: 05/31/2021 Reason for consult: Initial assessment;Early term 37-38.6wks Age:23 hours  Maternal Data Has patient been taught Hand Expression?: Yes Does the patient have breastfeeding experience prior to this delivery?: Yes How long did the patient breastfeed?: 1.5 yrs  Feeding Mother's Current Feeding Choice: Breast Milk Mom currently  breast feeding baby in left cradle hold, lips flanged at breast, occ swallow noted LATCH Score Latch: Grasps breast easily, tongue down, lips flanged, rhythmical sucking.  Audible Swallowing: A few with stimulation  Type of Nipple: Everted at rest and after stimulation  Comfort (Breast/Nipple): Soft / non-tender  Hold (Positioning): No assistance needed to correctly position infant at breast.  LATCH Score: 9   Lactation Tools Discussed/Used  LC name and no written on white board  Interventions Interventions: Breast feeding basics reviewed;Skin to skin  Discharge    Consult Status Consult Status: PRN    Dyann Kief 05/31/2021, 1:38 PM

## 2021-05-31 NOTE — Anesthesia Procedure Notes (Signed)
Spinal  Patient location during procedure: OR Start time: 05/31/2021 7:55 AM End time: 05/31/2021 8:01 AM Reason for block: surgical anesthesia Staffing Performed: resident/CRNA  Anesthesiologist: Gunnar Fusi, MD Resident/CRNA: Hedda Slade, CRNA Preanesthetic Checklist Completed: patient identified, IV checked, site marked, risks and benefits discussed, surgical consent, monitors and equipment checked, pre-op evaluation and timeout performed Spinal Block Patient position: sitting Prep: ChloraPrep Patient monitoring: heart rate, continuous pulse ox, blood pressure and cardiac monitor Approach: midline Location: L3-4 Injection technique: single-shot Needle Needle type: Whitacre and Introducer  Needle gauge: 24 G Needle length: 9 cm Assessment Sensory level: T10 Events: CSF return Additional Notes Negative paresthesia. Negative blood return. Positive free-flowing CSF. Expiration date of kit checked and confirmed. Patient tolerated procedure well, without complications.

## 2021-06-01 ENCOUNTER — Encounter: Payer: Self-pay | Admitting: Obstetrics and Gynecology

## 2021-06-01 MED ORDER — IBUPROFEN 600 MG PO TABS
600.0000 mg | ORAL_TABLET | Freq: Four times a day (QID) | ORAL | Status: DC
  Administered 2021-06-01 – 2021-06-02 (×4): 600 mg via ORAL
  Filled 2021-06-01 (×4): qty 1

## 2021-06-01 MED ORDER — OXYCODONE HCL 5 MG PO TABS
5.0000 mg | ORAL_TABLET | ORAL | Status: DC | PRN
  Administered 2021-06-01 (×2): 5 mg via ORAL
  Filled 2021-06-01 (×2): qty 1

## 2021-06-01 NOTE — Progress Notes (Signed)
Patient ID: Tammy Ward, female   DOB: Jul 20, 1996, 25 y.o.   MRN: [id number]   Progress Note - Cesarean Delivery  JANIFER GIESELMAN is a 25 y.o. G3P3003 now PP day 1 s/p C-Section, Low Transverse .   Subjective:  Patient reports no problems with eating, bowel movements, voiding, or their wound    Objective:  Vital signs in last 24 hours: Temp:  [97.9 F (36.6 C)-98.9 F (37.2 C)] 98.2 F (36.8 C) (06/11 0739) Pulse Rate:  [60-85] 71 (06/11 0739) Resp:  [0-24] 18 (06/11 0739) BP: (94-123)/(41-96) 103/60 (06/11 0739) SpO2:  [93 %-100 %] 100 % (06/11 0739)  Physical Exam:  General: alert, cooperative, and no distress Lochia: appropriate Uterine Fundus: firm Incision: Dressing intact    Data Review Recent Labs    05/29/21 1038  HGB 10.5*  HCT 31.2*    Assessment:  Active Problems:   Cesarean delivery delivered   Post op infection   [redacted] weeks gestation of pregnancy   Encounter for consultation for female sterilization   Status post Cesarean section. Doing well postoperatively.     Plan:       Continue current care.  Remove dressing - pt may shower  Probable discharge tomorrow.  [name], M.D. 06/01/2021 8:37 AM

## 2021-06-01 NOTE — Discharge Instructions (Signed)

## 2021-06-01 NOTE — Lactation Note (Addendum)
This note was copied from a baby's chart. Lactation Consultation Note  Patient Name: Tammy Ward GLOVF'I Date: 06/01/2021 Reason for consult: Follow-up assessment;Early term 37-38.6wks;Nipple pain/trauma Age:23 hours  Maternal Data Has patient been taught Hand Expression?: Yes Does the patient have breastfeeding experience prior to this delivery?: Yes How long did the patient breastfeed?: 1 1/2 years  Feeding Mother's Current Feeding Choice: Breast Milk  Mom has been breast feeding well all day without assistance with latching.  When observing this feeding, mom was eating while she breast fed.  [name] had a shallow latch and mom's nipples were red and tender.  Reminded mom to keep baby close with nose and chin touching the breast for deeper latch.  Hand expressed colostrum after breast feed and rubbed on nipples to prevent bacteria, lubricate and for tenderness.  Coconut oil and comfort gels given with instructions in alternating use. Mom has a medela DEBP and a mom cozy pump at home. Hand out given on what to expect with breast feeding the first 4 days of life reviewing normal newborn stomach size, feeding cues, supply and demand, adequate intake and out put, normal course of lactation and routine newborn feeding patterns.  Lactation Resource sheet given with support groups, informational web sites and contact numbers.  Encouraged mom to call with any questions, concerns or assistance. LATCH Score Latch: Grasps breast easily, tongue down, lips flanged, rhythmical sucking.  Audible Swallowing: A few with stimulation  Type of Nipple: Everted at rest and after stimulation  Comfort (Breast/Nipple): Filling, red/small blisters or bruises, mild/mod discomfort (red nipples)  Hold (Positioning): No assistance needed to correctly position infant at breast.  LATCH Score: 8   Lactation Tools Discussed/Used    Interventions Interventions: Breast feeding basics reviewed;Skin to  skin;Breast massage;Hand express;Breast compression;Adjust position;Support pillows;Position options;Coconut oil;Comfort gels;Education  Discharge Discharge Education: Engorgement and breast care;Warning signs for feeding baby;Other (comment) Pump: Personal (Mom has Medela DEBP and Mom Cozy pumps) WIC Program: Yes  Consult Status Consult Status: PRN    Louis Meckel 06/01/2021, 10:39 PM

## 2021-06-02 MED ORDER — OXYCODONE HCL 5 MG PO TABS: 5.0000 mg | ORAL_TABLET | ORAL | 0 refills | Status: DC | PRN

## 2021-06-02 MED ORDER — IBUPROFEN 600 MG PO TABS: 600.0000 mg | ORAL_TABLET | Freq: Four times a day (QID) | ORAL | 0 refills | Status: DC

## 2021-06-02 MED ORDER — DOCUSATE SODIUM 100 MG PO CAPS: 100.0000 mg | ORAL_CAPSULE | Freq: Two times a day (BID) | ORAL | 0 refills | Status: DC

## 2021-06-02 NOTE — Progress Notes (Signed)
RN went over discharge instructions with patient and patient verbalized and understanding of follow up care. Patient has no further questions at this time and discharged home in stable condition with significant other.

## 2021-06-02 NOTE — Anesthesia Postprocedure Evaluation (Signed)
Anesthesia Post Note  Patient: Tammy Ward  Procedure(s) Performed: CESAREAN SECTION WITH BILATERAL TUBAL LIGATION  Patient location during evaluation: Mother Baby Anesthesia Type: Spinal Level of consciousness: awake and alert Pain management: pain level controlled Vital Signs Assessment: post-procedure vital signs reviewed and stable Respiratory status: spontaneous breathing, nonlabored ventilation and respiratory function stable Cardiovascular status: stable Postop Assessment: no headache, no backache and epidural receding Anesthetic complications: no   No notable events documented.   Last Vitals:  Vitals:   06/01/21 2334 06/02/21 0830  BP: (!) 100/59 108/63  Pulse: 80 76  Resp: 18 18  Temp: 36.7 C 36.7 C  SpO2: 100% 99%    Last Pain:  Vitals:   06/02/21 0830  TempSrc: Oral  PainSc:                  Corinda Gubler

## 2021-06-02 NOTE — Discharge Summary (Signed)
Physician Obstetric Discharge Summary  Patient Name: Tammy Ward DOB: 1996/02/24 MRN: [id number]                            Discharge Summary  Date of Admission: 05/31/2021 Date of Discharge: 06/02/2021 Delivering Provider: Linzie Collin   Admitting Diagnosis: Post op infection [T81.40XA] at Unknown Secondary diagnosis:  Active Problems:   Cesarean delivery delivered   Post op infection   [redacted] weeks gestation of pregnancy   Encounter for consultation for female sterilization   Mode of Delivery:       low uterine, transverse     Discharge diagnosis: Term Pregnancy Delivered      Intrapartum Procedures: tubal ligation   Post partum procedures:  postpartum tubal   Complications: none                    Discharge Day SOAP Note:  Subjective:  The patient has no complaints.  She is ambulating well. She is taking PO well. Pain is well controlled with current medications. Patient is urinating without difficulty.  She is passing flatus.    Objective  Vital signs: BP 108/63 (BP Location: Left Arm)   Pulse 76   Temp 98 F (36.7 C) (Oral)   Resp 18   Ht 4\' 11"  (1.499 m)   Wt 70.3 kg   SpO2 99%   Breastfeeding Unknown   BMI 31.31 kg/m   Physical Exam: Gen: NAD Abdomen: clean, dry, no drainage Fundus Fundal Tone: Firm  Lochia Amount: Scant     Data Review Labs: Lab Results  Component Value Date   WBC 11.5 (H) 05/29/2021   HGB 10.5 (L) 05/29/2021   HCT 31.2 (L) 05/29/2021   MCV 89.9 05/29/2021   PLT 171 05/29/2021   CBC Latest Ref Rng & Units 05/29/2021 03/28/2021 12/07/2020  WBC 4.0 - 10.5 K/uL 11.5(H) 11.1(H) 9.9  Hemoglobin 12.0 - 15.0 g/dL 10.5(L) 10.5(L) 12.5  Hematocrit 36.0 - 46.0 % 31.2(L) 30.9(L) 37.0  Platelets 150 - 400 K/uL 171 186 250   O POS  Edinburgh Score: Edinburgh Postnatal Depression Scale Screening Tool 06/01/2021  I have been able to laugh and see the funny side of things. 0  I have looked forward with enjoyment to things. 0   I have blamed myself unnecessarily when things went wrong. 2  I have been anxious or worried for no good reason. 2  I have felt scared or panicky for no good reason. 1  Things have been getting on top of me. 2  I have been so unhappy that I have had difficulty sleeping. 1  I have felt sad or miserable. 2  I have been so unhappy that I have been crying. 2  The thought of harming myself has occurred to me. 0  Edinburgh Postnatal Depression Scale Total 12    Assessment:  Active Problems:   Cesarean delivery delivered   Post op infection   [redacted] weeks gestation of pregnancy   Encounter for consultation for female sterilization   Doing well.  Normal progress as expected.  Plan:  Discharge to home  Modified rest as directed - may slowly resume normal activities with restrictions  as discussed.  Medications as written.  See below for additional.      Discharge Instructions: Per After Visit Summary. Activity: Advance as tolerated. Pelvic rest for 6 weeks.  Also refer to After Visit Summary.  Wound care  discussed. Diet: Regular Medications: Allergies as of 06/02/2021   No Known Allergies      Medication List     TAKE these medications    docusate sodium 100 MG capsule Commonly known as: COLACE Take 1 capsule (100 mg total) by mouth 2 (two) times daily.   ferrous sulfate 325 (65 FE) MG tablet Take 325 mg by mouth daily with breakfast.   ibuprofen 600 MG tablet Commonly known as: ADVIL Take 1 tablet (600 mg total) by mouth every 6 (six) hours.   oxyCODONE 5 MG immediate release tablet Commonly known as: Oxy IR/ROXICODONE Take 1 tablet (5 mg total) by mouth every 4 (four) hours as needed for severe pain.   PRENATAL ADULT GUMMY/DHA/FA PO Take 2 each by mouth daily.       Outpatient follow up: Serafina Royals 1 week. ^ week ppv with Doreene Burke, CNM   Follow-up Information     [facility]. Schedule an appointment as soon as possible for a visit.   Why:  incision check Contact information: [address]  [address] [place] [address] [phone]               Postpartum contraception: Postpartum tubal   Discharged Condition: good  Discharged to: home  Newborn Data: Disposition:home with mother  Apgars: APGAR (1 MIN): 8   APGAR (5 MINS): 9   APGAR (10 MINS):    Baby Feeding: Breast  Doreene Burke, CNM  06/02/2021 9:00 AM

## 2021-06-02 NOTE — Final Progress Note (Signed)
Discharge Day SOAP Note:  Subjective:  The patient has no complaints.  She is ambulating well. She is taking PO well. Pain is well controlled with current medications. Patient is urinating without difficulty.  She is passing flatus.    Objective  Vital signs: BP 108/63 (BP Location: Left Arm)   Pulse 76   Temp 98 F (36.7 C) (Oral)   Resp 18   Ht 4\' 11"  (1.499 m)   Wt 70.3 kg   SpO2 99%   Breastfeeding Unknown   BMI 31.31 kg/m   Physical Exam: Gen: NAD Abdomen: clean, dry, no drainage Fundus Fundal Tone: Firm  Lochia Amount: Scant     Data Review Labs: Lab Results  Component Value Date   WBC 11.5 (H) 05/29/2021   HGB 10.5 (L) 05/29/2021   HCT 31.2 (L) 05/29/2021   MCV 89.9 05/29/2021   PLT 171 05/29/2021   CBC Latest Ref Rng & Units 05/29/2021 03/28/2021 12/07/2020  WBC 4.0 - 10.5 K/uL 11.5(H) 11.1(H) 9.9  Hemoglobin 12.0 - 15.0 g/dL 10.5(L) 10.5(L) 12.5  Hematocrit 36.0 - 46.0 % 31.2(L) 30.9(L) 37.0  Platelets 150 - 400 K/uL 171 186 250   O POS  Edinburgh Score: Edinburgh Postnatal Depression Scale Screening Tool 06/01/2021  I have been able to laugh and see the funny side of things. 0  I have looked forward with enjoyment to things. 0  I have blamed myself unnecessarily when things went wrong. 2  I have been anxious or worried for no good reason. 2  I have felt scared or panicky for no good reason. 1  Things have been getting on top of me. 2  I have been so unhappy that I have had difficulty sleeping. 1  I have felt sad or miserable. 2  I have been so unhappy that I have been crying. 2  The thought of harming myself has occurred to me. 0  Edinburgh Postnatal Depression Scale Total 12    Assessment:  Active Problems:   Cesarean delivery delivered   Post op infection   [redacted] weeks gestation of pregnancy   Encounter for consultation for female sterilization   Doing well.  Normal progress as expected.  Plan:  Discharge to home  Modified rest as directed -  may slowly resume normal activities with restrictions  as discussed.  Medications as written.  See below for additional.      Discharge Instructions: Per After Visit Summary. Activity: Advance as tolerated. Pelvic rest for 6 weeks.  Also refer to After Visit Summary.  Wound care discussed. Diet: Regular Medications: Allergies as of 06/02/2021   No Known Allergies      Medication List     TAKE these medications    docusate sodium 100 MG capsule Commonly known as: COLACE Take 1 capsule (100 mg total) by mouth 2 (two) times daily.   ferrous sulfate 325 (65 FE) MG tablet Take 325 mg by mouth daily with breakfast.   ibuprofen 600 MG tablet Commonly known as: ADVIL Take 1 tablet (600 mg total) by mouth every 6 (six) hours.   oxyCODONE 5 MG immediate release tablet Commonly known as: Oxy IR/ROXICODONE Take 1 tablet (5 mg total) by mouth every 4 (four) hours as needed for severe pain.   PRENATAL ADULT GUMMY/DHA/FA PO Take 2 each by mouth daily.       Outpatient follow up: 08/02/2021 1 week. ^ week ppv with Serafina Royals, CNM   Follow-up Information     [facility]. Schedule an appointment as soon as possible for a visit.   Why: incision check Contact information: [address]  [address] [place] [address] [phone]               Postpartum contraception: Postpartum tubal   Discharged Condition: good  Discharged to: home  Newborn Data: Disposition:home with mother  Apgars: APGAR (1 MIN): 8   APGAR (5 MINS): 9   APGAR (10 MINS):    Baby Feeding: Breast  Doreene Burke, CNM  06/02/2021 9:00 AM

## 2021-06-02 NOTE — Anesthesia Post-op Follow-up Note (Signed)
  Anesthesia Pain Follow-up Note  Patient: Tammy Ward  Day #: 2  Date of Follow-up: 06/02/2021 Time: 2:32 PM  Last Vitals:  Vitals:   06/01/21 2334 06/02/21 0830  BP: (!) 100/59 108/63  Pulse: 80 76  Resp: 18 18  Temp: 36.7 C 36.7 C  SpO2: 100% 99%    Level of Consciousness: alert  Pain: mild   Side Effects:None  Catheter Site Exam:clean, dry, no drainage     Plan: D/C from anesthesia care at surgeon's request  Corinda Gubler

## 2021-06-03 LAB — SURGICAL PATHOLOGY

## 2021-06-10 ENCOUNTER — Other Ambulatory Visit: Payer: Self-pay

## 2021-06-10 ENCOUNTER — Ambulatory Visit (INDEPENDENT_AMBULATORY_CARE_PROVIDER_SITE_OTHER): Payer: Medicaid Other | Admitting: Certified Nurse Midwife

## 2021-06-10 ENCOUNTER — Encounter: Payer: Self-pay | Admitting: Certified Nurse Midwife

## 2021-06-10 VITALS — BP 109/49 | HR 70 | Resp 16 | Ht 59.0 in | Wt 144.1 lb

## 2021-06-10 DIAGNOSIS — Z4889 Encounter for other specified surgical aftercare: Secondary | ICD-10-CM

## 2021-06-10 NOTE — Progress Notes (Signed)
    OBSTETRICS/GYNECOLOGY POST-OPERATIVE CLINIC VISIT  Subjective:     Tammy Ward is a 25 y.o. female who presents to the clinic 1 week status post  repeat cesarean section with bilateral tubal ligation  for requested sterilization and history of uterine rupture in labor .   Eating a regular diet without difficulty. Bowel movements are normal. Pain is controlled without any medications.  The following portions of the patient's history were reviewed and updated as appropriate: allergies, current medications, past family history, past medical history, past social history, past surgical history, and problem list.  Review of Systems  Pertinent items are noted in HPI.   Objective:    BP (!) 109/49   Pulse 70   Resp 16   Ht 4\' 11"  (1.499 m)   Wt 144 lb 1.6 oz (65.4 kg)   BMI 29.10 kg/m   General:  alert and no distress  Abdomen: soft, bowel sounds active, non-tender  Incision:   healing well, no drainage, no erythema, no hernia, no seroma, no swelling, no dehiscence    Assessment:   1. Encounter for post surgical wound check   2. Postpartum care and examination   3. Lactating mother     Plan:   1. Continue any current medications. 2. Wound care discussed; Three (3) Steri strips applied to right side 3. Operative findings again reviewed. Pathology report discussed. 4. Activity restrictions: no lifting more than 15 pounds 5. Anticipated return to work: not applicable. 6. Follow up: 5 weeks for PPV as scheduled or sooner if needed.     , CNM [facility], [facility] 06/10/21 2:01 PM

## 2021-06-10 NOTE — Patient Instructions (Signed)
Postpartum Care After Cesarean Delivery °This sheet gives you information about how to care for yourself from the time you deliver your baby to up to 6-12 weeks after delivery (postpartum period). Your health care provider may also give you more specific instructions. If you have problems or questions, contact your health care provider. °Follow these instructions at home: °Medicines °Take over-the-counter and prescription medicines only as told by your health care provider. °If you were prescribed an antibiotic medicine, take it as told by your health care provider. Do not stop taking the antibiotic even if you start to feel better. °Ask your health care provider if the medicine prescribed to you: °Requires you to avoid driving or using heavy machinery. °Can cause constipation. You may need to take actions to prevent or treat constipation, such as: °Drink enough fluid to keep your urine pale yellow. °Take over-the-counter or prescription medicines. °Eat foods that are high in fiber, such as beans, whole grains, and fresh fruits and vegetables. °Limit foods that are high in fat and processed sugars, such as fried or sweet foods. °Activity °Gradually return to your normal activities as told by your health care provider. °Avoid activities that take a lot of effort and energy (are strenuous) until approved by your health care provider. Walking at a slow to moderate pace is usually safe. Ask your health care provider what activities are safe for you. °Do not lift anything that is heavier than your baby or 10 lb (4.5 kg) as told by your health care provider. °Do not vacuum, climb stairs, or drive a car for as long as told by your health care provider. °If possible, have someone help you at home until you are able to do your usual activities yourself. °Rest as much as possible. Try to rest or take naps while your baby is sleeping. °Vaginal bleeding °It is normal to have vaginal bleeding (lochia) after delivery. Wear a  sanitary pad to absorb vaginal bleeding and discharge. °During the first week after delivery, the amount and appearance of lochia is often similar to a menstrual period. °Over the next few weeks, it will gradually decrease to a dry, yellow-brown discharge. °For most women, lochia stops completely by 4-6 weeks after delivery. Vaginal bleeding can vary from woman to woman. °Change your sanitary pads frequently. Watch for any changes in your flow, such as: °A sudden increase in volume. °A change in color. °Large blood clots. °If you pass a blood clot, save it and call your health care provider to discuss. Do not flush blood clots down the toilet before you get instructions from your health care provider. °Do not use tampons or douches until your health care provider says this is safe. °If you are not breastfeeding, your period should return 6-8 weeks after delivery. If you are breastfeeding, your period may return anytime between 8 weeks after delivery and the time that you stop breastfeeding. °Perineal care ° °If your C-section (Cesarean section) was unplanned, and you were allowed to labor and push before delivery, you may have pain, swelling, and discomfort of the tissue between your vaginal opening and your anus (perineum). You may also have an incision in the tissue (episiotomy) or the tissue may have torn during delivery. Follow these instructions as told by your health care provider: °Keep your perineum clean and dry as told by your health care provider. Use medicated pads and pain-relieving sprays and creams as directed. °If you have an episiotomy or vaginal tear, check the area every day for   signs of infection. Check for: °Redness, swelling, or pain. °Fluid or blood. °Warmth. °Pus or a bad smell. °You may be given a squirt bottle to use instead of wiping to clean the perineum area after you go to the bathroom. As you start healing, you may use the squirt bottle before wiping yourself. Make sure to wipe  gently. °To relieve pain caused by an episiotomy, vaginal tear, or hemorrhoids, try taking a warm sitz bath 2-3 times a day. A sitz bath is a warm water bath that is taken while you are sitting down. The water should only come up to your hips and should cover your buttocks. °Breast care °Within the first few days after delivery, your breasts may feel heavy, full, and uncomfortable (breast engorgement). You may also have milk leaking from your breasts. Your health care provider can suggest ways to help relieve breast discomfort. Breast engorgement should go away within a few days. °If you are breastfeeding: °Wear a bra that supports your breasts and fits you well. °Keep your nipples clean and dry. Apply creams and ointments as told by your health care provider. °You may need to use breast pads to absorb milk leakage. °You may have uterine contractions every time you breastfeed for several weeks after delivery. Uterine contractions help your uterus return to its normal size. °If you have any problems with breastfeeding, work with your health care provider or a lactation consultant. °If you are not breastfeeding: °Avoid touching your breasts as this can make your breasts produce more milk. °Wear a well-fitting bra and use cold packs to help with swelling. °Do not squeeze out (express) milk. This causes you to make more milk. °Intimacy and sexuality °Ask your health care provider when you can engage in sexual activity. This may depend on your: °Risk of infection. °Healing rate. °Comfort and desire to engage in sexual activity. °You are able to get pregnant after delivery, even if you have not had your period. If desired, talk with your health care provider about methods of family planning or birth control (contraception). °Lifestyle °Do not use any products that contain nicotine or tobacco, such as cigarettes, e-cigarettes, and chewing tobacco. If you need help quitting, ask your health care provider. °Do not drink  alcohol, especially if you are breastfeeding. °Eating and drinking ° °Drink enough fluid to keep your urine pale yellow. °Eat high-fiber foods every day. These may help prevent or relieve constipation. High-fiber foods include: °Whole grain cereals and breads. °Brown rice. °Beans. °Fresh fruits and vegetables. °Take your prenatal vitamins until your postpartum checkup or until your health care provider tells you it is okay to stop. °General instructions °Keep all follow-up visits for you and your baby as told by your health care provider. Most women visit their health care provider for a postpartum checkup within the first 3-6 weeks after delivery. °Contact a health care provider if you: °Feel unable to cope with the changes that a new baby brings to your life, and these feelings do not go away. °Feel unusually sad or worried. °Have breasts that are painful, hard, or turn red. °Have a fever. °Have trouble holding urine or keeping urine from leaking. °Have little or no interest in activities you used to enjoy. °Have not breastfed at all and you have not had a menstrual period for 12 weeks after delivery. °Have stopped breastfeeding and you have not had a menstrual period for 12 weeks after you stopped breastfeeding. °Have questions about caring for yourself or your baby. °Pass   a blood clot from your vagina. °Get help right away if you: °Have chest pain. °Have difficulty breathing. °Have sudden, severe leg pain. °Have severe pain or cramping in your abdomen. °Bleed from your vagina so much that you fill more than one sanitary pad in one hour. Bleeding should not be heavier than your heaviest period. °Develop a severe headache. °Faint. °Have blurred vision or spots in your vision. °Have a bad-smelling vaginal discharge. °Have thoughts about hurting yourself or your baby. °If you ever feel like you may hurt yourself or others, or have thoughts about taking your own life, get help right away. You can go to your nearest  emergency department or call: °Your local emergency services (911 in the [place] °A suicide crisis helpline, such as the National Suicide Prevention Lifeline at 1-800-273-8255. This is open 24 hours a day. °Summary °The period of time from when you deliver your baby to up to 6-12 weeks after delivery is called the postpartum period. °Gradually return to your normal activities as told by your health care provider. °Keep all follow-up visits for you and your baby as told by your health care provider. °This information is not intended to replace advice given to you by your health care provider. Make sure you discuss any questions you have with your health care provider. °Document Revised: 07/28/2018 Document Reviewed: 07/28/2018 °Elsevier Patient Education © 2022 [employer] ° °

## 2021-07-15 ENCOUNTER — Encounter: Payer: Medicaid Other | Admitting: Certified Nurse Midwife

## 2021-07-19 IMAGING — US US OB COMP +14 WK
1 series · 14 of 28 positions shown · non-contrast
Comparison: none

CLINICAL DATA: Fetal anatomic survey

EXAM:
OBSTETRICAL ULTRASOUND >14 WKS

[Series 1: us ob comp + 14 wk · 14 of 97 slices shown]
[im 4/97]
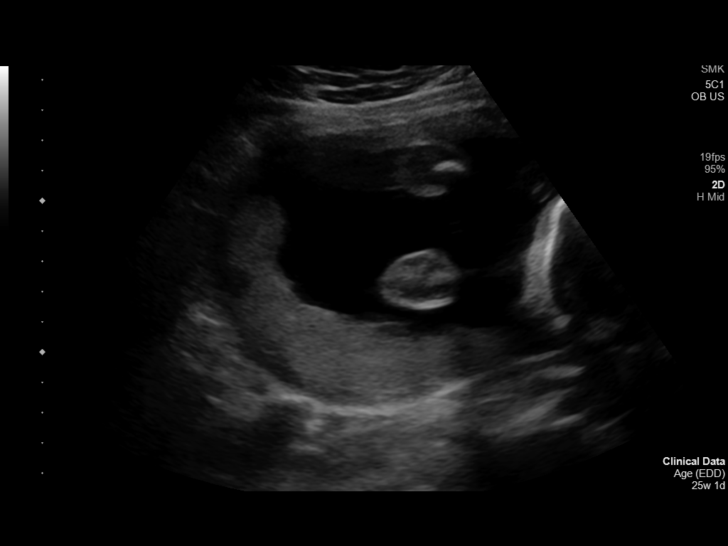
[im 11/97]
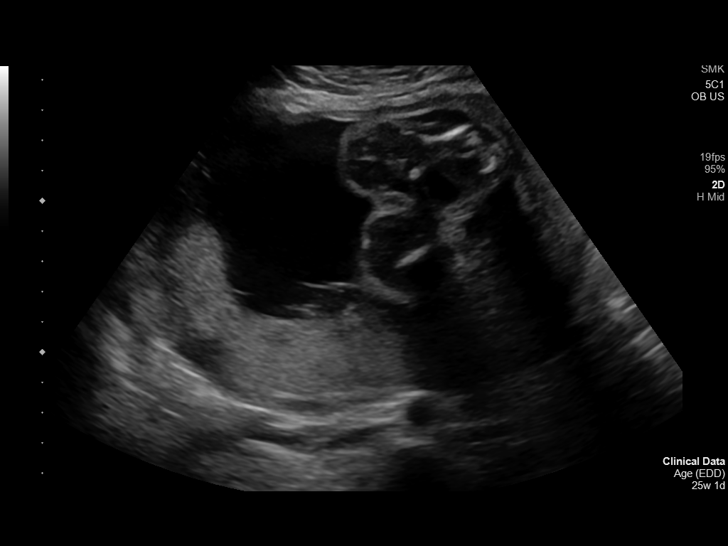
[im 18/97]
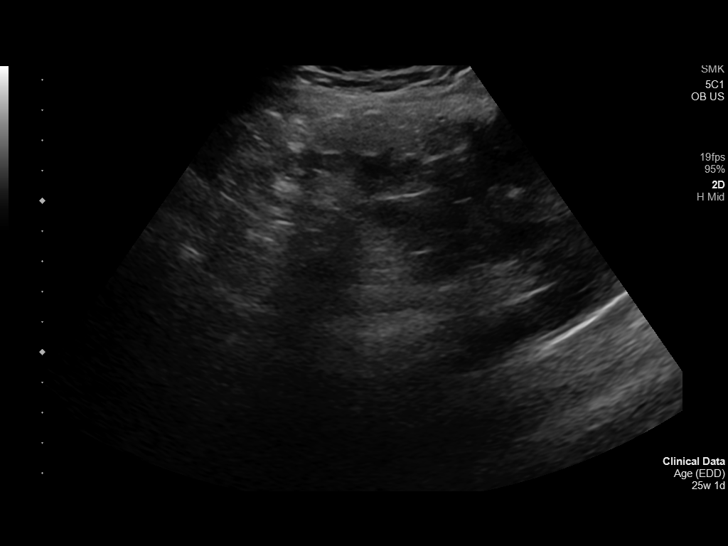
[im 25/97]
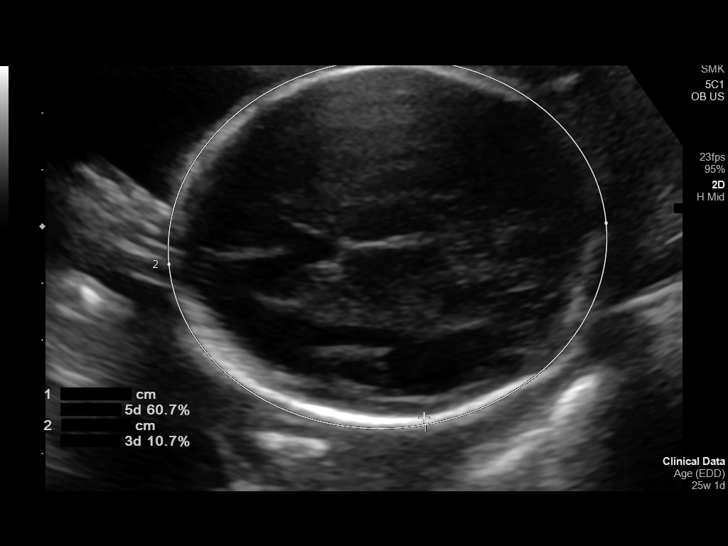
[im 33/97]
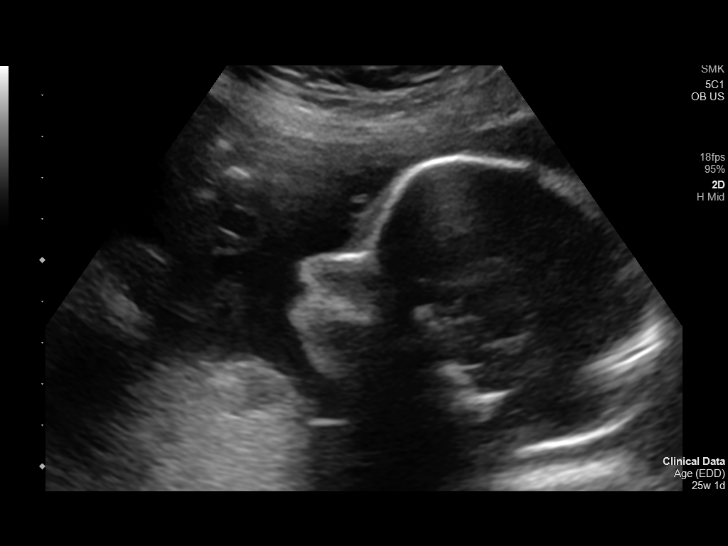
[im 40/97]
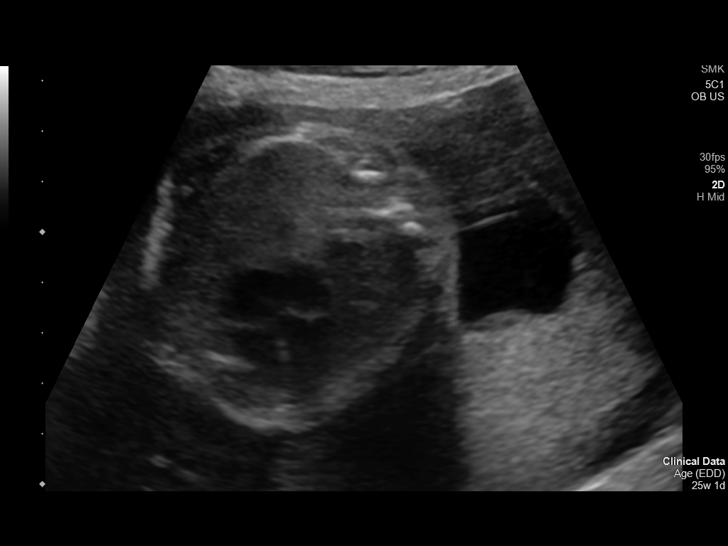
[im 47/97]
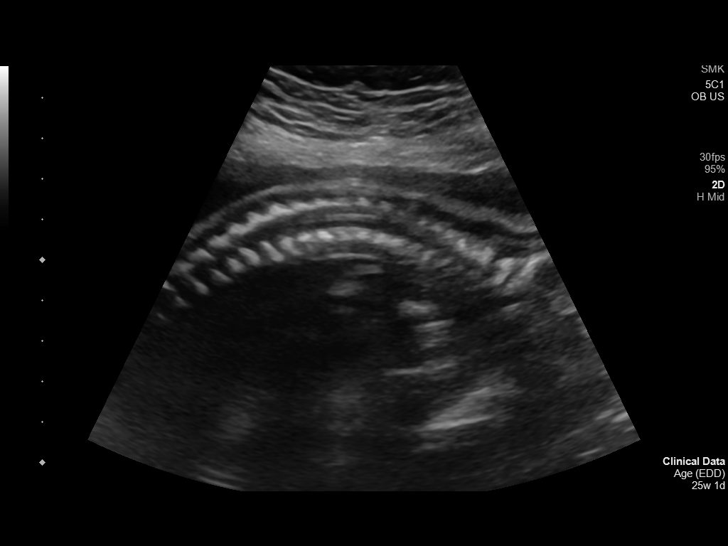
[im 54/97]
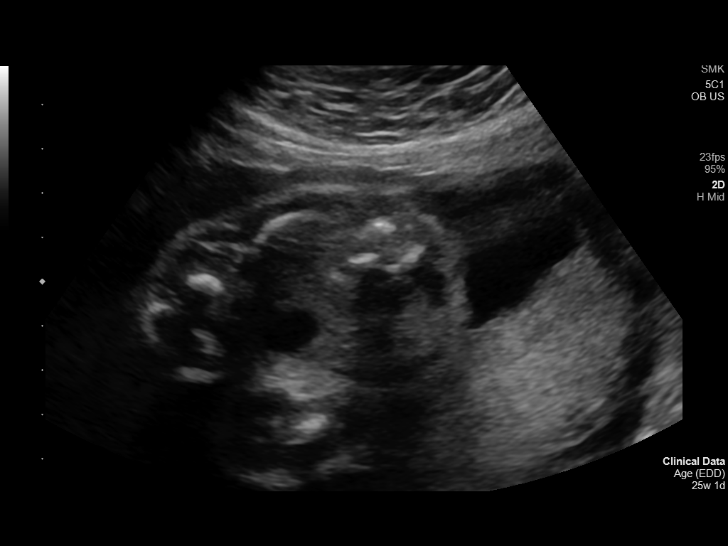
[im 61/97]
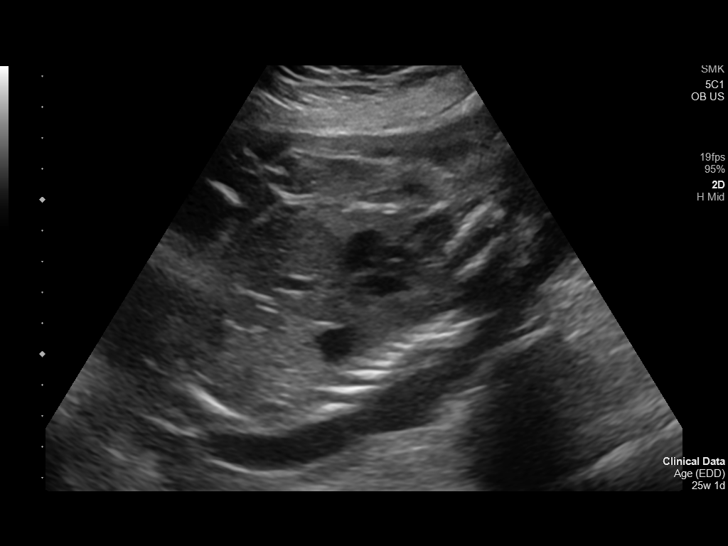
[im 68/97]
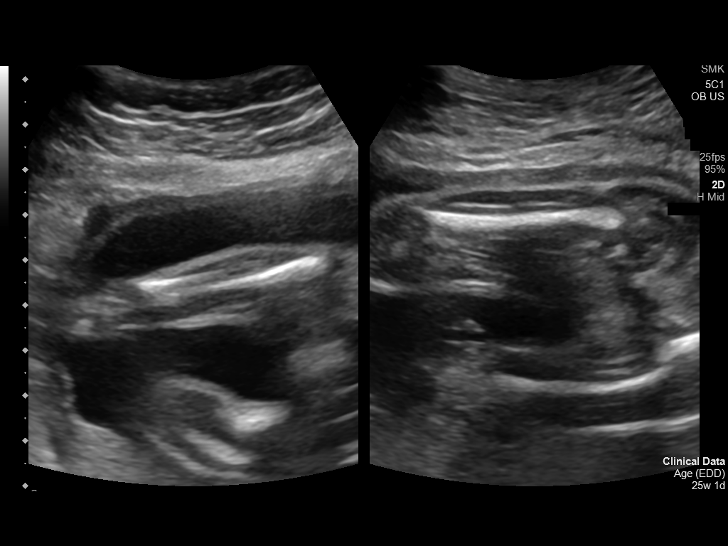
[im 75/97]
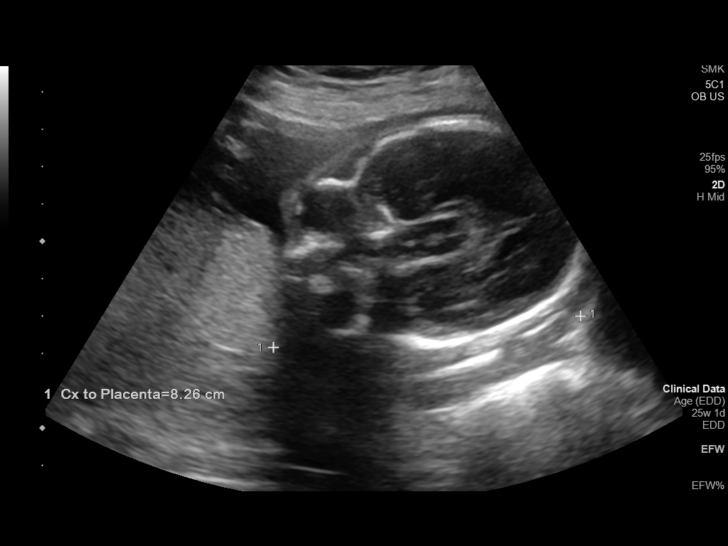
[im 82/97]
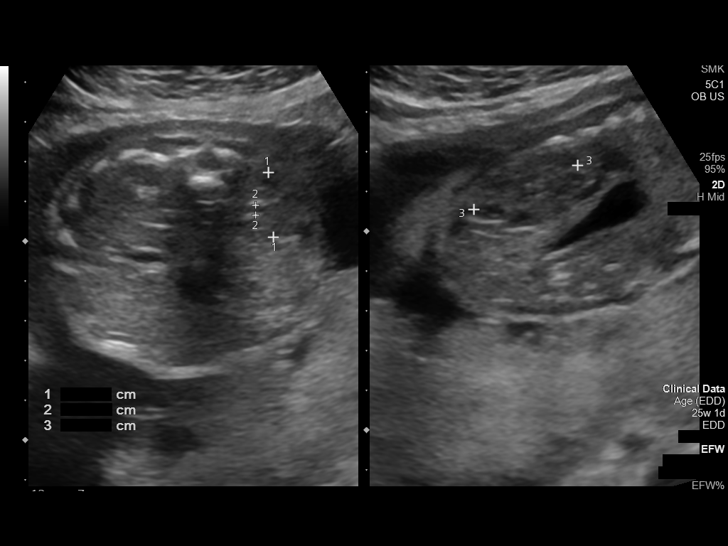
[im 89/97]
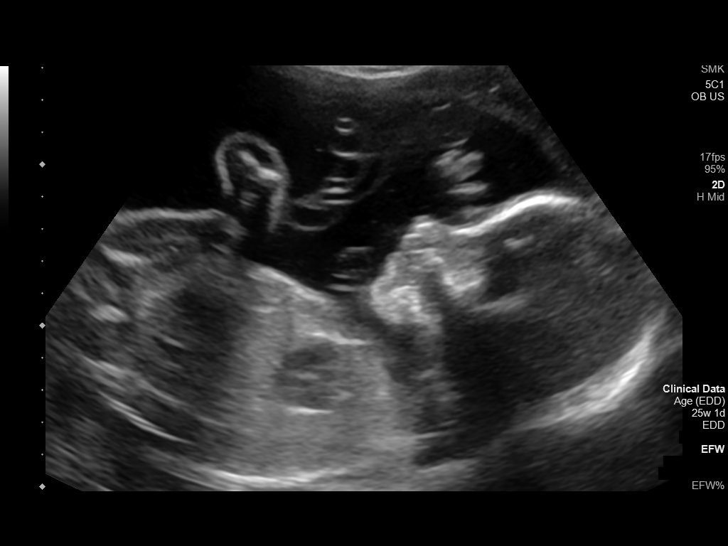
[im 97/97]
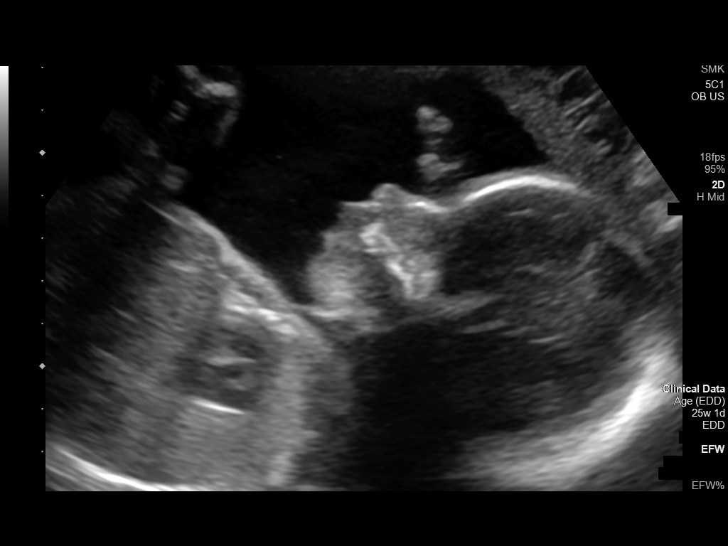

[14 of 28 positions shown; findings below may reference images not displayed]

FINDINGS: Number of Fetuses: 1

Heart Rate:  158 bpm

Movement: Yes

Presentation: Cephalic

Previa: No

Placental Location: Posterior

Amniotic Fluid (Subjective): Normal

Amniotic Fluid (Objective):

Vertical pocket = 6.1cm

FETAL BIOMETRY

BPD: 6.3cm 25w 4d

HC:   22.5cm 24w 4d

AC:   21.0cm 25w 4d

FL:   4.4cm 24w 2d

Current Mean GA: 25w 0d US EDC: 06/19/2021

Assigned GA:  25w 1d Assigned EDC: 06/18/2021

FETAL ANATOMY

Lateral Ventricles: Appears normal

Thalami/CSP: Appears normal

Posterior Fossa:  Appears normal

Nuchal Region: Appears normal   NFT= N/A > 20 WKS

Upper Lip: Appears normal

Spine: Appears normal

4 Chamber Heart on Left: Appears normal

LVOT: Appears normal

RVOT: Appears normal

Stomach on Left: Appears normal

3 Vessel Cord: Appears normal

Cord Insertion site: Appears normal

Kidneys: Appears normal

Bladder: Appears normal

Extremities: Appears normal

Technically difficult due to: N/a

Maternal Findings:

Cervix:  4.1 cm
IMPRESSION: 1. Single live intrauterine pregnancy as above, estimated age 25
weeks and 0 days.
2. Normal fetal anatomic survey.

## 2023-09-24 NOTE — Progress Notes (Deleted)
    GYNECOLOGY PROGRESS NOTE  Subjective:    Patient ID: Tammy Ward, female    DOB: 03-11-1996, 27 y.o.   MRN: [id number]  HPI  Patient is a 27 y.o. G13P3003 female who presents for evaluation of amenorrhea. She had a tubal ligation done on 05/31/2021.  {Common ambulatory SmartLinks:19316}  Review of Systems {ros; complete:30496}   Objective:   unknown if currently breastfeeding. There is no height or weight on file to calculate BMI. General appearance: {general exam:16600} Abdomen: {abdominal exam:16834} Pelvic: {pelvic exam:16852::"cervix normal in appearance","external genitalia normal","no adnexal masses or tenderness","no cervical motion tenderness","rectovaginal septum normal","uterus normal size, shape, and consistency","vagina normal without discharge"} Extremities: {extremity exam:5109} Neurologic: {neuro exam:17854}   Assessment:   No diagnosis found.   Plan:   There are no diagnoses linked to this encounter.     Hildred Laser, MD [facility]

## 2023-09-29 ENCOUNTER — Ambulatory Visit: Payer: Medicaid Other | Admitting: Obstetrics and Gynecology

## 2024-02-03 ENCOUNTER — Encounter: Payer: Self-pay | Admitting: Family Medicine

## 2024-02-03 ENCOUNTER — Ambulatory Visit (INDEPENDENT_AMBULATORY_CARE_PROVIDER_SITE_OTHER): Payer: Medicaid Other | Admitting: Family Medicine

## 2024-02-03 VITALS — BP 115/82 | HR 71 | Ht 59.0 in | Wt 159.6 lb

## 2024-02-03 DIAGNOSIS — Z23 Encounter for immunization: Secondary | ICD-10-CM

## 2024-02-03 DIAGNOSIS — Z7689 Persons encountering health services in other specified circumstances: Secondary | ICD-10-CM | POA: Diagnosis not present

## 2024-02-03 DIAGNOSIS — N644 Mastodynia: Secondary | ICD-10-CM

## 2024-02-03 DIAGNOSIS — Z0000 Encounter for general adult medical examination without abnormal findings: Secondary | ICD-10-CM

## 2024-02-03 NOTE — Patient Instructions (Signed)

## 2024-02-03 NOTE — Progress Notes (Unsigned)
New Patient Office Visit  Subjective   Patient ID: Tammy Ward, female    DOB: 04/18/1996  Age: 28 y.o. MRN: [id number]  CC:  Chief Complaint  Patient presents with   Establish Care    Establish care , pain in left breast on the outside near armpit   HPI Tammy Ward is a 28 year old female who presents to establish with [facility] at [facility].   CC: Patient here to establish care  Last PCP: about 6 years ago  Specialists: OBGYN, about 2 years ago   L breast pain- not severe but consistent- comes and goes  On the outer region x 2 months  Denies warmth, redness, texture changes to skin, nipple discharge.   Outpatient Encounter Medications as of 02/03/2024  Medication Sig   [DISCONTINUED] docusate sodium (COLACE) 100 MG capsule Take 1 capsule (100 mg total) by mouth 2 (two) times daily.   [DISCONTINUED] ferrous sulfate 325 (65 FE) MG tablet Take 325 mg by mouth daily with breakfast.   [DISCONTINUED] ibuprofen (ADVIL) 600 MG tablet Take 1 tablet (600 mg total) by mouth every 6 (six) hours.   [DISCONTINUED] Prenatal MV & Min w/FA-DHA (PRENATAL ADULT GUMMY/DHA/FA PO) Take 2 each by mouth daily.   No facility-administered encounter medications on file as of 02/03/2024.    Patient Active Problem List   Diagnosis Date Noted   Post op infection 05/31/2021   [redacted] weeks gestation of pregnancy    Encounter for consultation for female sterilization    Dyspareunia, female 02/18/2020   Cesarean delivery delivered 08/05/2019   History of rupture of uterus 01/06/2019   History of low transverse cesarean section 01/06/2019   Past Medical History:  Diagnosis Date   Anemia    iron deficiency   Depression    History of kidney stones    Left genital labial abscess    Ruptured uterus during labor, delivered    Past Surgical History:  Procedure Laterality Date   ABSCESS DRAINAGE  02-02-15   Labial    CESAREAN SECTION N/A 11/18/2017   Procedure: CESAREAN  SECTION;  Surgeon: Herold Harms, MD;  Location: [facility] ORS;  Service: Obstetrics;  Laterality: N/A;   CESAREAN SECTION N/A 08/05/2019   Procedure: REPEAT CESAREAN SECTION;  Surgeon: Linzie Collin, MD;  Location: [facility] ORS;  Service: Obstetrics;  Laterality: N/A;   CESAREAN SECTION WITH BILATERAL TUBAL LIGATION N/A 05/31/2021   Procedure: CESAREAN SECTION WITH BILATERAL TUBAL LIGATION;  Surgeon: Linzie Collin, MD;  Location: [facility] ORS;  Service: Obstetrics;  Laterality: N/A;   KIDNEY STONE SURGERY  2014   WISDOM TOOTH EXTRACTION     Family History  Problem Relation Age of Onset   Thyroid disease Mother    Asthma Sister    Thyroid disease Sister    Breast cancer Maternal Great-grandmother    Cancer Neg Hx    Diabetes Neg Hx    Heart disease Neg Hx    Ovarian cancer Neg Hx    Colon cancer Neg Hx    Social History   Socioeconomic History   Marital status: Single    Spouse name: Not on file   Number of children: 2   Years of education: Not on file   Highest education level: Not on file  Occupational History   Not on file  Tobacco Use   Smoking status: Never    Passive exposure: Never   Smokeless tobacco: Never  Vaping Use   Vaping status: Never  Used  Substance and Sexual Activity   Alcohol use: Not Currently    Alcohol/week: 0.0 standard drinks of alcohol   Drug use: Never   Sexual activity: Yes    Birth control/protection: Surgical  Other Topics Concern   Not on file  Social History Narrative   Patient lives with 2 children and fiance.    Will return to part time work when ready.   Social Drivers of Corporate investment banker Strain: Not on file  Food Insecurity: Not on file  Transportation Needs: Not on file  Physical Activity: Not on file  Stress: Not on file  Social Connections: Not on file  Intimate Partner Violence: Not on file   Outpatient Medications Prior to Visit  Medication Sig Dispense Refill   docusate sodium (COLACE) 100 MG capsule  Take 1 capsule (100 mg total) by mouth 2 (two) times daily. 10 capsule 0   ferrous sulfate 325 (65 FE) MG tablet Take 325 mg by mouth daily with breakfast.     ibuprofen (ADVIL) 600 MG tablet Take 1 tablet (600 mg total) by mouth every 6 (six) hours. 30 tablet 0   Prenatal MV & Min w/FA-DHA (PRENATAL ADULT GUMMY/DHA/FA PO) Take 2 each by mouth daily.     No facility-administered medications prior to visit.   No Known Allergies  ROS: see HPI     Objective   Today's Vitals   02/03/24 1554  BP: 115/82  Pulse: 71  SpO2: 97%  Weight: 159 lb 9.6 oz (72.4 kg)  Height: 4\' 11"  (1.499 m)   Physical Exam Chest:  Breasts:    Tanner Score is 5.       Comments: Tenderness present along circled area of diagram       GENERAL: Well-appearing, in NAD. Well nourished.  SKIN: Pink, warm and dry. No rash, lesion, ulceration, or ecchymoses.  Head: Normocephalic. NECK: Trachea midline. Full ROM w/o pain or tenderness. No lymphadenopathy.  EARS: Tympanic membranes are intact, translucent without bulging and without drainage. Appropriate landmarks visualized.  EYES: Conjunctiva clear without exudates. EOMI, PERRL, no drainage present.  NOSE: Septum midline w/o deformity. Nares patent, mucosa pink and non-inflamed w/o drainage. No sinus tenderness.  THROAT: Uvula midline. Oropharynx clear. Tonsils non-inflamed without exudate. Mucous membranes pink and moist.  BREASTS: Breasts pendulous, symmetrical, and w/o palpable masses and tenderness. Nipples everted and w/o discharge. No rash or skin retraction. No axillary or supraclavicular lymphadenopathy.  RESPIRATORY: Chest wall symmetrical. Respirations even and non-labored. Breath sounds clear to auscultation bilaterally.  CARDIAC: S1, S2 present, regular rate and rhythm without murmur or gallops. Peripheral pulses 2+ bilaterally.  MSK: Muscle tone and strength appropriate for age. Joints w/o tenderness, redness, or swelling.  EXTREMITIES: Without  clubbing, cyanosis, or edema.  NEUROLOGIC: No motor or sensory deficits. Steady, even gait. C2-C12 intact.  PSYCH/MENTAL STATUS: Alert, oriented x 3. Cooperative, appropriate mood and affect.    Chaperone Betsy May, CMA present during breast exam.   Assessment & Plan:   1. Encounter to establish care (Primary) Patient is a 58- year-old female who presents today to establish care with primary care at [facility]. Reviewed the past medical history, family history, social history, surgical history, medications and allergies today- updates made as indicated. She has concerns today about left breast pain.  2. Pain of left breast Patient presents today with left breast pain on the lateral area of her breast from the 12 o'clock to 6 o'clock outer region. No cervical, axillary, supraclavicular, or  pectoral lymphadenopathy present. Nipples everted without discharge. No rash, skin retraction, tenderness, or palpable masses. Left breast ultrasound ordered due to family history of breast cancer and slight tenderness to palpation. If imaging is unremarkable, most likely related to musculoskeletal etiology.  - US BREAST COMPLETE UNI LEFT INC AXILLA; Future  3. Wellness examination Will completing fasting labs and annual physical exam with pap in near future.  - CBC with Differential/Platelet; Future - Comprehensive metabolic panel; Future - Hemoglobin A1c; Future - Lipid panel; Future - TSH Rfx on Abnormal to Free T4; Future  4. Encounter for immunization Patient agreeable to influenza immunization today.  - Flu vaccine trivalent PF, 6mos and older(Flulaval,Afluria,Fluarix,Fluzone)   Return in 6 weeks (on 03/16/2024) for Physical (with fasting labs) & Pap smear .   Alyson Reedy, FNP

## 2024-03-01 ENCOUNTER — Emergency Department
Admission: EM | Admit: 2024-03-01 | Discharge: 2024-03-01 | Discharge status: Against Medical Advice | Attending: Emergency Medicine | Admitting: Emergency Medicine

## 2024-03-01 ENCOUNTER — Other Ambulatory Visit: Payer: Self-pay

## 2024-03-01 DIAGNOSIS — Z5321 Procedure and treatment not carried out due to patient leaving prior to being seen by health care provider: Secondary | ICD-10-CM | POA: Insufficient documentation

## 2024-03-01 DIAGNOSIS — R112 Nausea with vomiting, unspecified: Secondary | ICD-10-CM | POA: Diagnosis not present

## 2024-03-01 DIAGNOSIS — R109 Unspecified abdominal pain: Secondary | ICD-10-CM | POA: Insufficient documentation

## 2024-03-01 LAB — COMPREHENSIVE METABOLIC PANEL
ALT: 24 U/L (ref 0–44)
AST: 24 U/L (ref 15–41)
Albumin: 4 g/dL (ref 3.5–5.0)
Alkaline Phosphatase: 67 U/L (ref 38–126)
Anion gap: 9 (ref 5–15)
BUN: 11 mg/dL (ref 6–20)
CO2: 20 mmol/L — ABNORMAL LOW (ref 22–32)
Calcium: 8.7 mg/dL — ABNORMAL LOW (ref 8.9–10.3)
Chloride: 106 mmol/L (ref 98–111)
Creatinine, Ser: 0.48 mg/dL (ref 0.44–1.00)
GFR, Estimated: >60 mL/min (ref >60)
Glucose, Bld: 105 mg/dL — ABNORMAL HIGH (ref 70–99)
Potassium: 3.3 mmol/L — ABNORMAL LOW (ref 3.5–5.1)
Sodium: 135 mmol/L (ref 135–145)
Total Bilirubin: 0.8 mg/dL (ref 0.0–1.2)
Total Protein: 7.2 g/dL (ref 6.5–8.1)

## 2024-03-01 LAB — LIPASE, BLOOD: Lipase: 23 U/L (ref 11–51)

## 2024-03-01 LAB — CBC
HCT: 37.8 % (ref 36.0–46.0)
Hemoglobin: 12.7 g/dL (ref 12.0–15.0)
MCH: 30.4 pg (ref 26.0–34.0)
MCHC: 33.6 g/dL (ref 30.0–36.0)
MCV: 90.4 fL (ref 80.0–100.0)
Platelets: 226 10*3/uL (ref 150–400)
RBC: 4.18 MIL/uL (ref 3.87–5.11)
RDW: 12.4 % (ref 11.5–15.5)
WBC: 4.7 10*3/uL (ref 4.0–10.5)
nRBC: 0 % (ref 0.0–0.2)

## 2024-03-01 NOTE — ED Notes (Signed)
 No answer when called several times from lobby

## 2024-03-01 NOTE — ED Triage Notes (Signed)
 Pt reports abd pain and n/v for 2 days. Pt denies cough congestion fever vaginal bleeding/discharge or dysuria.

## 2024-03-09 ENCOUNTER — Other Ambulatory Visit: Payer: Self-pay | Admitting: Family Medicine

## 2024-03-09 DIAGNOSIS — Z0000 Encounter for general adult medical examination without abnormal findings: Secondary | ICD-10-CM

## 2024-03-10 ENCOUNTER — Encounter: Payer: Self-pay | Admitting: Family Medicine

## 2024-03-10 LAB — COMPREHENSIVE METABOLIC PANEL
ALT: 29 IU/L (ref 0–32)
AST: 23 IU/L (ref 0–40)
Albumin: 4.8 g/dL (ref 4.0–5.0)
Alkaline Phosphatase: 82 IU/L (ref 44–121)
BUN/Creatinine Ratio: 26 — ABNORMAL HIGH (ref 9–23)
BUN: 15 mg/dL (ref 6–20)
Bilirubin Total: 0.3 mg/dL (ref 0.0–1.2)
CO2: 23 mmol/L (ref 20–29)
Calcium: 9.6 mg/dL (ref 8.7–10.2)
Chloride: 102 mmol/L (ref 96–106)
Creatinine, Ser: 0.58 mg/dL (ref 0.57–1.00)
Globulin, Total: 2.5 g/dL (ref 1.5–4.5)
Glucose: 72 mg/dL (ref 70–99)
Potassium: 4.5 mmol/L (ref 3.5–5.2)
Sodium: 139 mmol/L (ref 134–144)
Total Protein: 7.3 g/dL (ref 6.0–8.5)
eGFR: 128 mL/min/{1.73_m2} (ref >59)

## 2024-03-10 LAB — LIPID PANEL
Chol/HDL Ratio: 4.3 ratio (ref 0.0–4.4)
Cholesterol, Total: 226 mg/dL — ABNORMAL HIGH (ref 100–199)
HDL: 52 mg/dL (ref >39)
LDL Chol Calc (NIH): 150 mg/dL — ABNORMAL HIGH (ref 0–99)
Triglycerides: 135 mg/dL (ref 0–149)
VLDL Cholesterol Cal: 24 mg/dL (ref 5–40)

## 2024-03-10 LAB — CBC WITH DIFFERENTIAL/PLATELET
Basophils Absolute: 0.1 10*3/uL (ref 0.0–0.2)
Basos: 1 %
EOS (ABSOLUTE): 0.3 10*3/uL (ref 0.0–0.4)
Eos: 4 %
Hematocrit: 41.6 % (ref 34.0–46.6)
Hemoglobin: 13.6 g/dL (ref 11.1–15.9)
Immature Grans (Abs): 0 10*3/uL (ref 0.0–0.1)
Immature Granulocytes: 0 %
Lymphocytes Absolute: 2.2 10*3/uL (ref 0.7–3.1)
Lymphs: 33 %
MCH: 30.2 pg (ref 26.6–33.0)
MCHC: 32.7 g/dL (ref 31.5–35.7)
MCV: 92 fL (ref 79–97)
Monocytes Absolute: 0.4 10*3/uL (ref 0.1–0.9)
Monocytes: 6 %
Neutrophils Absolute: 3.7 10*3/uL (ref 1.4–7.0)
Neutrophils: 56 %
Platelets: 349 10*3/uL (ref 150–450)
RBC: 4.5 x10E6/uL (ref 3.77–5.28)
RDW: 12.2 % (ref 11.7–15.4)
WBC: 6.7 10*3/uL (ref 3.4–10.8)

## 2024-03-10 LAB — HEMOGLOBIN A1C
Est. average glucose Bld gHb Est-mCnc: 103 mg/dL
Hgb A1c MFr Bld: 5.2 % (ref 4.8–5.6)

## 2024-03-10 LAB — TSH+FREE T4
Free T4: 1.2 ng/dL (ref 0.82–1.77)
TSH: 1.66 u[IU]/mL (ref 0.450–4.500)

## 2024-03-16 ENCOUNTER — Ambulatory Visit (INDEPENDENT_AMBULATORY_CARE_PROVIDER_SITE_OTHER): Payer: Medicaid Other | Admitting: Family Medicine

## 2024-03-16 ENCOUNTER — Other Ambulatory Visit (HOSPITAL_COMMUNITY)
Admission: RE | Admit: 2024-03-16 | Discharge: 2024-03-16 | Disposition: A | Discharge status: Home / Self Care | Source: Ambulatory Visit | Attending: Family Medicine | Admitting: Family Medicine

## 2024-03-16 VITALS — BP 111/70 | HR 89 | Temp 98.3°F | Ht 59.0 in | Wt 160.8 lb

## 2024-03-16 DIAGNOSIS — Z124 Encounter for screening for malignant neoplasm of cervix: Secondary | ICD-10-CM | POA: Diagnosis present

## 2024-03-16 DIAGNOSIS — Z0000 Encounter for general adult medical examination without abnormal findings: Secondary | ICD-10-CM | POA: Diagnosis not present

## 2024-03-16 NOTE — Patient Instructions (Addendum)
 Please call [facility] to schedule your breast ultrasound at [phone].   Dental list         Updated 8.18.22 These dentists all accept Medicaid.  The list is a courtesy and for your convenience. Estos dentistas aceptan Medicaid.  La lista es para su [place] y es una cortesa.     [facility]     [phone] [address]  [address] [place] [address] Se habla espaol From 68 to 26 years old Parent may go with child only for cleaning Vinson Moselle DDS     [phone] Milus Banister, DDS (Spanish speaking) [address] [place]  [address] Se habla espaol New patients 8 and under, established until 18y.o Parent may go with child if needed  Marolyn Hammock DMD    [phone] [address] [place] [address] Se habla espaol [place] spoken From 41 years old Parent may go with child [facility]     [phone] [address] [place] [address] Se habla espaol, translation line, prefer for translator to be present  From 35 to 20 years old Ages 1-3y parents may go back 4+ go back by themselves parents can watch at "bay area"  [place] [facility]  [phone] [facility]      [address]  [place] [address] Se habla espaol [place] spoken (preferred to bring translator) From teeth coming in to 26 years old Parent may go with child  [facility]     [phone] [address] [place]. [place] [address] Requires certification. Call for information. Requiere certificacin. Llame para informacin. Algunos dias se habla espaol  From birth to 20 years Parent possibly goes with child   Bradd Canary DDS     [phone] [address]  [address] [place] [address] Se habla espaol From 4 to 18 years  Parent may NOT go with child  [facility] DDS     Garlon Hatchet DDS  [phone] [address] [place] [address] Se habla  espaol- phone interpreters Ages 10 years and older Parent may go with child- 15+ go back alone   Melynda Ripple DDS    [phone] [address] [place] [address] Se habla espaol , 3 of their providers speak [place] From 18 months to 33 years old Parent may go with child [facility]  [phone] [address] [place] [address] Se habla espanol Interpretation for other languages Special needs children welcome Ages 48 and under  [facility]    [phone] [address] [place] [address] No se habla espaol From birth [facility]   [phone] Dr. Orlean Patten [address] [place], [place] [address] From birth to 20 y- new patients 10 and under Special needs children welcome   [facility] [phone] Se habla espaol [address] [place], [place] [address]  6 month to 19 years  [facility] Janyth Pupa [phone] [address] [place], [place] [address]  Se habla espaol 6 months and up, highest age is 16-17 for new patients, will see established patients until 67 y.o Parents may go back with child       Health Maintenance Recommendations Screening Testing Mammogram Every 1 -2 years based on history and risk factors Starting at age 1 Pap Smear Ages 21-39 every 3 years Ages 90-65 every 5 years with HPV testing More frequent testing may be  required based on results and history Colon Cancer Screening Every 1-10 years based on test performed, risk factors, and history Starting at age 19 Bone Density Screening Every 2-10 years based on history Starting at age 59 for women Recommendations for men differ based on medication usage, history, and risk factors AAA Screening One time ultrasound Men 25-70 years old who have every smoked Lung Cancer Screening Low Dose Lung CT every 12 months Age 3-80 years with a 30 pack-year smoking history who still smoke or who have quit  within the last 15 years   Screening Labs Routine  Labs: Complete Blood Count (CBC), Complete Metabolic Panel (CMP), Cholesterol (Lipid Panel) Every 6-12 months based on history and medications May be recommended more frequently based on current conditions or previous results Hemoglobin A1c Lab Every 3-12 months based on history and previous results Starting at age 67 or earlier with diagnosis of diabetes, high cholesterol, BMI >26, and/or risk factors Frequent monitoring for patients with diabetes to ensure blood sugar control Thyroid Panel (TSH w/ T3 & T4) Every 6 months based on history, symptoms, and risk factors May be repeated more often if on medication HIV One time testing for all patients 71 and older May be repeated more frequently for patients with increased risk factors or exposure Hepatitis C One time testing for all patients 64 and older May be repeated more frequently for patients with increased risk factors or exposure Gonorrhea, Chlamydia Every 12 months for all sexually active persons 13-24 years Additional monitoring may be recommended for those who are considered high risk or who have symptoms PSA Men 19-38 years old with risk factors Additional screening may be recommended from age 65-69 based on risk factors, symptoms, and history   Vaccine Recommendations Tetanus Booster All adults every 10 years Flu Vaccine All patients 6 months and older every year COVID Vaccine All patients 12 years and older Initial dosing with booster May recommend additional booster based on age and health history HPV Vaccine 2 doses all patients age 90-26 Dosing may be considered for patients over 26 Shingles Vaccine (Shingrix) 2 doses all adults 55 years and older Pneumonia (Pneumovax 23) All adults 65 years and older May recommend earlier dosing based on health history Pneumonia (Prevnar 52) All adults 65 years and older Dosed 1 year after Pneumovax 23   Additional  Screening, Testing, and Vaccinations may be recommended on an individualized basis based on family history, health history, risk factors, and/or exposure.  __________________________________________________________   Diet Recommendations for All Patients   I recommend that all patients maintain a diet low in saturated fats, carbohydrates, and cholesterol. While this can be challenging at first, it is not impossible and small changes can make big differences.  Things to try: Decreasing the amount of soda, sweet tea, and/or juice to one or less per day and replace with water While water is always the first choice, if you do not like water you may consider adding a water additive without sugar to improve the taste other sugar free drinks Replace potatoes with a brightly colored vegetable at dinner Use healthy oils, such as canola oil or olive oil, instead of butter or hard margarine Limit your bread intake to two pieces or less a day Replace regular pasta with low carb pasta options Bake, broil, or grill foods instead of frying Monitor portion sizes  Eat smaller, more frequent meals throughout the day instead of large meals   An important thing to remember is, if you love  foods that are not great for your health, you don't have to give them up completely. Instead, allow these foods to be a reward when you have done well. Allowing yourself to still have special treats every once in a while is a nice way to tell yourself thank you for working hard to keep yourself healthy.    Also remember that every day is a new day. If you have a bad day and "fall off the wagon", you can still climb right back up and keep moving along on your journey!   We have resources available to help you!  Some websites that may be helpful include: www.http://www.wall-moore.info/        Www.VeryWellFit.com _____________________________________________________________   Activity Recommendations for All Patients   I recommend that all  adults get at least 20 minutes of moderate physical activity that elevates your heart rate at least 5 days out of the week.  Some examples include: Walking or jogging at a pace that allows you to carry on a conversation Cycling (stationary bike or outdoors) Water aerobics Yoga Weight lifting Dancing If physical limitations prevent you from putting stress on your joints, exercise in a pool or seated in a chair are excellent options.   Do determine your MAXIMUM heart rate for activity: YOUR AGE - 220 = MAX HeartRate    Remember! Do not push yourself too hard.  Start slowly and build up your pace, speed, weight, time in exercise, etc.  Allow your body to rest between exercise and get good sleep. You will need more water than normal when you are exerting yourself. Do not wait until you are thirsty to drink. Drink with a purpose of getting in at least 8, 8 ounce glasses of water a day plus more depending on how much you exercise and sweat.      If you begin to develop dizziness, chest pain, abdominal pain, jaw pain, shortness of breath, headache, vision changes, lightheadedness, or other concerning symptoms, stop the activity and allow your body to rest. If your symptoms are severe, seek emergency evaluation immediately. If your symptoms are concerning, but not severe, please let us know so that we can recommend further evaluation.

## 2024-03-16 NOTE — Progress Notes (Signed)
 Subjective:   Tammy Ward [birth year]  03/16/2024   CC: Chief Complaint  Patient presents with   Annual Exam    Pt. Here for a annual exam.    Gynecologic Exam    Pt. Here for PAP.    HPI: Tammy Ward is a 28 y.o. female who presents for a routine health maintenance exam.  Fasting labs collected at previous office visit- discussed results today.    Not aware of familial HLD  Exercise- skating 4 hours per week and plans to start going to the gym twice daily  Diet- trying to eat out less, focusing on healthier   HEALTH SCREENINGS: - Vision Screening:  plans to schedule, no changes to vision - Dental Visits:  plans to schedule - Pap smear: pap done today - Breast Exam:  up to date - STD Screening: Ordered today - Mammogram (40+): Not applicable  - Colonoscopy (45+): Not applicable  - Bone Density (65+ or under 65 with predisposing conditions): Not applicable  - Lung CA screening with low-dose CT:  Not applicable Adults age 76-80 who are current cigarette smokers or quit within the last 15 years. Must have 20 pack year history.   Depression and Anxiety Screen done today and results listed below:     03/16/2024    1:13 PM 03/16/2024    1:12 PM 02/03/2024    3:58 PM 02/17/2020   11:06 AM 09/16/2019    3:34 PM  Depression screen PHQ 2/9  Decreased Interest 2 2 0 1 0  Down, Depressed, Hopeless 1 1 0 2 0  PHQ - 2 Score 3 3 0 3 0  Altered sleeping 0 0 0 0 0  Tired, decreased energy 2  1 1  0  Change in appetite 0  0 3 0  Feeling bad or failure about yourself  1  0 2 0  Trouble concentrating 1  1 0 0  Moving slowly or fidgety/restless 0  0 1 0  Suicidal thoughts 0  0 1 0  PHQ-9 Score 7 3 2 11  0  Difficult doing work/chores Somewhat difficult  Not difficult at all Somewhat difficult       03/16/2024    1:14 PM 02/03/2024    3:58 PM 02/17/2020   11:07 AM  GAD 7 : Generalized Anxiety Score  Nervous, Anxious, on Edge 1 0 3  Control/stop worrying 1 0 1  Worry  too much - different things 1 0 2  Trouble relaxing 1 0 3  Restless 0 0 0  Easily annoyed or irritable 1 0 3  Afraid - awful might happen 0 0 1  Total GAD 7 Score 5 0 13  Anxiety Difficulty Not difficult at all Not difficult at all Somewhat difficult    IMMUNIZATIONS: - Tdap: Tetanus vaccination status reviewed: last tetanus booster within 10 years. - HPV: Up to date - Influenza: Up to date - Pneumovax: Not applicable - Prevnar 20: Not applicable - Zostavax (50+): Not applicable  Past medical history, surgical history, medications, allergies, family history and social history reviewed with patient today and changes made to appropriate areas of the chart.   Past Medical History:  Diagnosis Date   Anemia    iron deficiency   Blood transfusion without reported diagnosis    Depression    History of kidney stones    Left genital labial abscess    Ruptured uterus during labor, delivered     Past Surgical History:  Procedure Laterality Date  ABSCESS DRAINAGE  02/02/2015   Labial    CESAREAN SECTION N/A 11/18/2017   Procedure: CESAREAN SECTION;  Surgeon: Herold Harms, MD;  Location: [facility];  Service: Obstetrics;  Laterality: N/A;   CESAREAN SECTION N/A 08/05/2019   Procedure: REPEAT CESAREAN SECTION;  Surgeon: Linzie Collin, MD;  Location: [facility];  Service: Obstetrics;  Laterality: N/A;   CESAREAN SECTION WITH BILATERAL TUBAL LIGATION N/A 05/31/2021   Procedure: CESAREAN SECTION WITH BILATERAL TUBAL LIGATION;  Surgeon: Linzie Collin, MD;  Location: [facility];  Service: Obstetrics;  Laterality: N/A;   KIDNEY STONE SURGERY  2014   TUBAL LIGATION     WISDOM TOOTH EXTRACTION      No current outpatient medications on file prior to visit.   No current facility-administered medications on file prior to visit.   No Known Allergies  Social History   Socioeconomic History   Marital status: Single    Spouse name: Not on file   Number of children: 2   Years  of education: Not on file   Highest education level: 12th grade  Occupational History   Not on file  Tobacco Use   Smoking status: Never    Passive exposure: Never   Smokeless tobacco: Never  Vaping Use   Vaping status: Never Used  Substance and Sexual Activity   Alcohol use: Not Currently   Drug use: Never   Sexual activity: Yes    Birth control/protection: Surgical  Other Topics Concern   Not on file  Social History Narrative   Patient lives with 2 children and fiance.    Will return to part time work when ready.   Social Drivers of Corporate investment banker Strain: Low Risk  (03/16/2024)   Overall Financial Resource Strain (CARDIA)    Difficulty of Paying Living Expenses: Not hard at all  Food Insecurity: No Food Insecurity (03/16/2024)   Hunger Vital Sign    Worried About Running Out of Food in the Last Year: Never true    Ran Out of Food in the Last Year: Never true  Transportation Needs: No Transportation Needs (03/16/2024)   PRAPARE - Administrator, Civil Service (Medical): No    Lack of Transportation (Non-Medical): No  Physical Activity: Insufficiently Active (03/16/2024)   Exercise Vital Sign    Days of Exercise per Week: 2 days    Minutes of Exercise per Session: 60 min  Stress: No Stress Concern Present (03/16/2024)   [employer] of Occupational Health - Occupational Stress Questionnaire    Feeling of Stress : Only a little  Social Connections: Moderately Integrated (03/16/2024)   Social Connection and Isolation Panel [NHANES]    Frequency of Communication with Friends and Family: More than three times a week    Frequency of Social Gatherings with Friends and Family: Once a week    Attends Religious Services: Never    Database administrator or Organizations: Yes    Attends Engineer, structural: More than 4 times per year    Marital Status: Living with partner  Intimate Partner Violence: Not on file   Social History   Tobacco Use   Smoking Status Never   Passive exposure: Never  Smokeless Tobacco Never   Social History   Substance and Sexual Activity  Alcohol Use Not Currently    Family History  Problem Relation Age of Onset   Thyroid disease Mother    Arthritis Mother    Depression Father  ADD / ADHD Sister    Asthma Sister    Thyroid disease Sister    Cervical cancer Paternal Aunt    Lymphoma Maternal Great-grandmother    Diabetes Neg Hx    Heart disease Neg Hx    Ovarian cancer Neg Hx    Colon cancer Neg Hx     ROS: Denies fever, fatigue, unexplained weight loss/gain, chest pain, SHOB, and palpitations. Denies neurological deficits, gastrointestinal or genitourinary complaints, and skin changes.   Objective:   Today's Vitals   03/16/24 1310  BP: 111/70  Pulse: 89  Temp: 98.3 F (36.8 C)  TempSrc: Oral  SpO2: 98%  Weight: 160 lb 12.8 oz (72.9 kg)  Height: 4\' 11"  (1.499 m)  PainSc: 0-No pain    GENERAL APPEARANCE: Well-appearing, in NAD. Well nourished.  SKIN: Pink, warm and dry. Turgor normal. No rash, lesion, ulceration, or ecchymoses. Hair evenly distributed.  HEENT: HEAD: Normocephalic.  EYES: PERRLA. EOMI. Lids intact w/o defect. Sclera white, Conjunctiva pink w/o exudate.  EARS: External ear w/o redness, swelling, masses or lesions. EAC clear. TM's intact, translucent w/o bulging, appropriate landmarks visualized. Appropriate acuity to conversational tones.  NOSE: Septum midline w/o deformity. Nares patent, mucosa pink and non-inflamed w/o drainage. No sinus tenderness.  THROAT: Uvula midline. Oropharynx clear. Tonsils non-inflamed w/o exudate. Oral mucosa pink and moist.  NECK: Supple, Trachea midline. Full ROM w/o pain or tenderness. No lymphadenopathy. Thyroid non-tender w/o enlargement or palpable masses.  BREASTS: Deferred today.  RESPIRATORY: Chest wall symmetrical w/o masses. Respirations even and non-labored. Breath sounds clear to auscultation bilaterally. No wheezes,  rales, rhonchi, or crackles. CARDIAC: S1, S2 present, regular rate and rhythm. No gallops, murmurs, rubs, or clicks. PMI w/o lifts, heaves, or thrills. No carotid bruits. Capillary refill <2 seconds. Peripheral pulses 2+ bilaterally. GI: Abdomen soft w/o distention. Normoactive bowel sounds. No palpable masses or tenderness. No guarding or rebound tenderness. Liver and spleen w/o tenderness or enlargement. No CVA tenderness.  GU: External genitalia without erythema, lesions, or masses. No lymphadenopathy. Vaginal mucosa pink and moist without exudate, lesions, or ulcerations. Piercing present. Cervix pink without discharge. Cervical os closed. Uterus and adnexae palpable, not enlarged, and w/o tenderness. No palpable masses.  MSK: Muscle tone and strength appropriate for age, w/o atrophy or abnormal movement.  EXTREMITIES: Active ROM intact, w/o tenderness, crepitus, or contracture. No obvious joint deformities or effusions. No clubbing, edema, or cyanosis.  NEUROLOGIC: CN's II-XII intact. Motor strength symmetrical with no obvious weakness. No sensory deficits. DTR's 2+ symmetric bilaterally. Steady, even gait.  PSYCH/MENTAL STATUS: Alert, oriented x 3. Cooperative, appropriate mood and affect.   Chaperoned by Ewing Schlein, CMA   Results for orders placed or performed in visit on 03/09/24  Lipid panel   Collection Time: 03/09/24  8:56 AM  Result Value Ref Range   Cholesterol, Total 226 (H) 100 - 199 mg/dL   Triglycerides 098 0 - 149 mg/dL   HDL 52 >11 mg/dL   VLDL Cholesterol Cal 24 5 - 40 mg/dL   LDL Chol Calc (NIH) 914 (H) 0 - 99 mg/dL   Chol/HDL Ratio 4.3 0.0 - 4.4 ratio  CBC with Differential/Platelet   Collection Time: 03/09/24  8:56 AM  Result Value Ref Range   WBC 6.7 3.4 - 10.8 x10E3/uL   RBC 4.50 3.77 - 5.28 x10E6/uL   Hemoglobin 13.6 11.1 - 15.9 g/dL   Hematocrit 78.2 95.6 - 46.6 %   MCV 92 79 - 97 fL   MCH 30.2 26.6 -  33.0 pg   MCHC 32.7 31.5 - 35.7 g/dL   RDW 16.1 09.6 -  04.5 %   Platelets 349 150 - 450 x10E3/uL   Neutrophils 56 Not Estab. %   Lymphs 33 Not Estab. %   Monocytes 6 Not Estab. %   Eos 4 Not Estab. %   Basos 1 Not Estab. %   Neutrophils Absolute 3.7 1.4 - 7.0 x10E3/uL   Lymphocytes Absolute 2.2 0.7 - 3.1 x10E3/uL   Monocytes Absolute 0.4 0.1 - 0.9 x10E3/uL   EOS (ABSOLUTE) 0.3 0.0 - 0.4 x10E3/uL   Basophils Absolute 0.1 0.0 - 0.2 x10E3/uL   Immature Granulocytes 0 Not Estab. %   Immature Grans (Abs) 0.0 0.0 - 0.1 x10E3/uL  Comprehensive metabolic panel   Collection Time: 03/09/24  8:56 AM  Result Value Ref Range   Glucose 72 70 - 99 mg/dL   BUN 15 6 - 20 mg/dL   Creatinine, Ser 4.09 0.57 - 1.00 mg/dL   eGFR 811 >91 YN/WGN/5.62   BUN/Creatinine Ratio 26 (H) 9 - 23   Sodium 139 134 - 144 mmol/L   Potassium 4.5 3.5 - 5.2 mmol/L   Chloride 102 96 - 106 mmol/L   CO2 23 20 - 29 mmol/L   Calcium 9.6 8.7 - 10.2 mg/dL   Total Protein 7.3 6.0 - 8.5 g/dL   Albumin 4.8 4.0 - 5.0 g/dL   Globulin, Total 2.5 1.5 - 4.5 g/dL   Bilirubin Total 0.3 0.0 - 1.2 mg/dL   Alkaline Phosphatase 82 44 - 121 IU/L   AST 23 0 - 40 IU/L   ALT 29 0 - 32 IU/L  Hemoglobin A1c   Collection Time: 03/09/24  8:56 AM  Result Value Ref Range   Hgb A1c MFr Bld 5.2 4.8 - 5.6 %   Est. average glucose Bld gHb Est-mCnc 103 mg/dL  TSH + free T4   Collection Time: 03/09/24  8:56 AM  Result Value Ref Range   TSH 1.660 0.450 - 4.500 uIU/mL   Free T4 1.20 0.82 - 1.77 ng/dL    Assessment & Plan:   1. Wellness examination (Primary) - Encouraged a healthy well-balanced diet. Patient may adjust caloric intake to maintain or achieve ideal body weight. May reduce intake of dietary saturated fat and total fat and have adequate dietary potassium and calcium preferably from fresh fruits, vegetables, and low-fat dairy products.   - Advised to avoid cigarette smoking. - Discussed with the patient that most people either abstain from alcohol or drink within safe limits (<=14/week  and <=4 drinks/occasion for males, <=7/weeks and <= 3 drinks/occasion for females) and that the risk for alcohol disorders and other health effects rises proportionally with the number of drinks per week and how often a drinker exceeds daily limits. - Discussed cessation/primary prevention of drug use and availability of treatment for abuse.  - Discussed sexually transmitted diseases, avoidance of unintended pregnancy and contraceptive alternatives. - Stressed the importance of regular exercise - Injury prevention: Discussed safety belts, safety helmets, smoke detector, smoking near bedding or upholstery.  - Dental health: Discussed importance of regular tooth brushing, flossing, and dental visits.   2. Screening for cervical cancer Advised patient that screening cytology for cervical cancer could be impacted due to the presence of blood. However, recommendations suggest performing rather than deferring the test. Patient is agreeable to procedure today. Deferred clinical breast exam, as it was completed at previous visit. Advised patient to perform breast self-examination monthly during menstrual cycle. Visual  inspection of external genitalia of vagina. No inguinal lymphadenopathy palpated. Vaginal walls and cervix appear pink during speculum examination. Cervical os visualized. Pap completed with no complications. Will update patient with results.  - Cytology - PAP   NEXT PREVENTATIVE PHYSICAL DUE IN 1 YEAR.  Return in about 1 year (around 03/16/2025) for Physical with fasting labs.  Patient to reach out to office if new, worrisome, or unresolved symptoms arise or if no improvement in patient's condition. Patient verbalized understanding and is agreeable to treatment plan. All questions answered to patient's satisfaction.   Alyson Reedy, FNP

## 2024-03-18 LAB — CYTOLOGY - PAP
Adequacy: ABSENT
Chlamydia: NEGATIVE
Comment: NEGATIVE
Comment: NEGATIVE
Comment: NORMAL
Diagnosis: NEGATIVE
Neisseria Gonorrhea: NEGATIVE
Trichomonas: NEGATIVE

## 2024-03-21 ENCOUNTER — Encounter: Payer: Self-pay | Admitting: Family Medicine

## 2024-10-24 ENCOUNTER — Other Ambulatory Visit: Payer: Self-pay

## 2024-10-24 ENCOUNTER — Emergency Department
Admission: EM | Admit: 2024-10-24 | Discharge: 2024-10-24 | Disposition: A | Discharge status: Home / Self Care | Attending: Emergency Medicine | Admitting: Emergency Medicine

## 2024-10-24 DIAGNOSIS — S20362A Insect bite (nonvenomous) of left front wall of thorax, initial encounter: Secondary | ICD-10-CM | POA: Insufficient documentation

## 2024-10-24 DIAGNOSIS — W57XXXA Bitten or stung by nonvenomous insect and other nonvenomous arthropods, initial encounter: Secondary | ICD-10-CM | POA: Diagnosis not present

## 2024-10-24 NOTE — ED Provider Notes (Addendum)
 [facility] Provider Note    Event Date/Time   First MD Initiated Contact with Patient 10/24/24 [phone]     (approximate)   History   Insect Bite   HPI  Tammy Ward is a 28 y.o. female   Past medical history of healthy young woman who presents Emergency Department with a bug bite to her left chest wall.  Awoke with a stinging sensation and redness and a small mosquito bite looking lesion.  No systemic symptoms like respiratory symptoms, intraoral swelling, or GI symptoms.  No other suspected bug bites elsewhere nor to her partner who shares the same bed.  Independent Historian contributed to assessment above: Partner at bedside corroborates information above    Physical Exam   Triage Vital Signs: ED Triage Vitals [10/24/24 0441]  Encounter Vitals Group     BP 119/78     Girls Systolic BP Percentile      Girls Diastolic BP Percentile      Boys Systolic BP Percentile      Boys Diastolic BP Percentile      Pulse Rate 62     Resp 17     Temp 98.4 F (36.9 C)     Temp src      SpO2 99 %     Weight 150 lb (68 kg)     Height 4' 11 (1.499 m)     Head Circumference      Peak Flow      Pain Score 2     Pain Loc      Pain Education      Exclude from Growth Chart     Most recent vital signs: Vitals:   10/24/24 0441  BP: 119/78  Pulse: 62  Resp: 17  Temp: 98.4 F (36.9 C)  SpO2: 99%    General: Awake, no distress.  CV:  Good peripheral perfusion.  Resp:  Normal effort.  Abd:  No distention.  Other:  Small raised 0.5 cm area to the left chest wall with some small surrounding erythematous changes to the skin.  Airway very much intact and normal vital signs.   ED Results / Procedures / Treatments   Labs (all labs ordered are listed, but only abnormal results are displayed) Labs Reviewed - No data to display  PROCEDURES:  Critical Care performed: No  Procedures   MEDICATIONS ORDERED IN ED: Medications - No data to  display   IMPRESSION / MDM / ASSESSMENT AND PLAN / ED COURSE  I reviewed the triage vital signs and the nursing notes.                                Patient's presentation is most consistent with acute complicated illness / injury requiring diagnostic workup.  Differential diagnosis includes, but is not limited to, insect bite, allergic reaction, considered but less likely anaphylaxis    MDM:    Small insect bite in this otherwise well-appearing patient with no signs of airway obstruction or other anaphylactic changes, looks very well.  They have no other infestations in the home, doubt any other high risk animal bite, anticipatory guidance given, appropriate for discharge.        FINAL CLINICAL IMPRESSION(S) / ED DIAGNOSES   Final diagnoses:  Insect bite of left front wall of thorax, initial encounter     Rx / DC Orders   ED Discharge Orders     None  Note:  This document was prepared using Dragon voice recognition software and may include unintentional dictation errors.    Cyrena Mylar, MD 10/24/24 [id number]    Cyrena Mylar, MD 10/24/24 [phone]

## 2024-10-24 NOTE — ED Triage Notes (Signed)
 Reports an insect biting her left chest while asleep, pt c/o burnign at site.

## 2024-10-24 NOTE — Discharge Instructions (Signed)
 For your insect bite, especially if you develop itching, you may use a low dose steroid cream like hydrocortisone 1% and apply to the area affected.  You may also use a antihistamine medication like Zyrtec/cetirizine or loratadine to help with itching.  Thank you for choosing us  for your health care today!  Please see your primary doctor this week for a follow up appointment.   If you have any new, worsening, or unexpected symptoms call your doctor right away or come back to the emergency department for reevaluation.  It was my pleasure to care for you today.   Ginnie EDISON Cyrena, MD
